# Patient Record
Sex: Female | Born: 1970
Health system: Southern US, Community
[De-identification: ages and names within clinical notes are randomized; demographics above are authoritative.]

## PROBLEM LIST (undated history)

## (undated) DIAGNOSIS — F32A Depression, unspecified: Secondary | ICD-10-CM

## (undated) DIAGNOSIS — I10 Essential (primary) hypertension: Secondary | ICD-10-CM

## (undated) DIAGNOSIS — F319 Bipolar disorder, unspecified: Secondary | ICD-10-CM

## (undated) DIAGNOSIS — J449 Chronic obstructive pulmonary disease, unspecified: Secondary | ICD-10-CM

## (undated) DIAGNOSIS — F259 Schizoaffective disorder, unspecified: Secondary | ICD-10-CM

## (undated) HISTORY — PX: CHOLECYSTECTOMY: SHX55

---
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
## 2016-01-20 NOTE — Congregational Nurse Program (Signed)
Congregational Nurse Program Note  Date of Encounter: 01/16/2016  Past Medical History: No past medical history on file.  Encounter Details:     CNP Questionnaire - 01/16/16 1146      Patient Demographics   Is this a new or existing patient? New   Patient is considered a/an Not Applicable   Race Caucasian/White     Patient Assistance   Location of Patient Assistance Not Applicable   Patient's financial/insurance status Low Income;Medicaid   Uninsured Patient ([employer] officer, trade unionCard/Care Connects) No   Patient referred to apply for the following financial assistance Not Applicable   Food insecurities addressed Not Applicable   Transportation assistance No   Assistance securing medications No   Educational health offerings Behavioral health;Navigating the healthcare system;Safety     Encounter Details   Primary purpose of visit Chronic Illness/Condition Visit;Navigating the Healthcare System;Safety   Was an Emergency Department visit averted? Not Applicable   Does patient have a medical provider? No   Patient referred to Area Agency   Was a mental health screening completed? (GAINS tool) No   Does patient have dental issues? No   Does patient have vision issues? No   Does your patient have an abnormal blood pressure today? No   Since previous encounter, have you referred patient for abnormal blood pressure that resulted in a new diagnosis or medication change? No   Does your patient have an abnormal blood glucose today? No   Since previous encounter, have you referred patient for abnormal blood glucose that resulted in a new diagnosis or medication change? No   Was there a life-saving intervention made? No      Discussed with client the need to establish with a mental health care provider before her medications run out.  Referred to [facility] for continued care

## 2016-02-13 ENCOUNTER — Encounter: Payer: Self-pay | Admitting: Pediatric Intensive Care

## 2016-02-14 ENCOUNTER — Encounter: Payer: Self-pay | Admitting: Pediatric Intensive Care

## 2016-02-17 ENCOUNTER — Encounter: Payer: Self-pay | Admitting: Pediatric Intensive Care

## 2016-02-28 NOTE — Congregational Nurse Program (Signed)
Congregational Nurse Program Note  Date of Encounter: 02/22/2016  Past Medical History: No past medical history on file.  Encounter Details:     CNP Questionnaire - 02/22/16 1258      Patient Demographics   Is this a new or existing patient? Existing   Patient is considered a/an Not Applicable   Race Caucasian/White     Patient Assistance   Location of Patient Assistance Not Applicable   Patient's financial/insurance status Low Income;Medicaid   Uninsured Patient ([employer] officer, trade unionCard/Care Connects) No   Patient referred to apply for the following financial assistance Not Applicable   Food insecurities addressed Not Applicable   Transportation assistance Yes   Type of Assistance Bus Pass Given   Assistance securing medications No   Educational health offerings Behavioral health;Navigating the healthcare system;Safety     Encounter Details   Primary purpose of visit Chronic Illness/Condition Visit;Navigating the Healthcare System;Safety   Was an Emergency Department visit averted? Not Applicable   Does patient have a medical provider? No   Patient referred to Area Agency   Was a mental health screening completed? (GAINS tool) No   Does patient have dental issues? No   Does patient have vision issues? No   Does your patient have an abnormal blood pressure today? No   Since previous encounter, have you referred patient for abnormal blood pressure that resulted in a new diagnosis or medication change? No   Does your patient have an abnormal blood glucose today? No   Since previous encounter, have you referred patient for abnormal blood glucose that resulted in a new diagnosis or medication change? No   Was there a life-saving intervention made? No     States her hands are shaking.  Tremors noted both hands.  She states she thinks it is a result of her haldol.  Instructed to see provider mary Jacqulyn BathAnn Placey [facility] at [facility] [place]  Bus passes provided

## 2016-03-03 NOTE — Congregational Nurse Program (Signed)
Congregational Nurse Program Note  Date of Encounter: 02/14/2016  Past Medical History: No past medical history on file.  Encounter Details:     CNP Questionnaire - 02/22/16 1258      Patient Demographics   Is this a new or existing patient? Existing   Patient is considered a/an Not Applicable   Race Caucasian/White     Patient Assistance   Location of Patient Assistance Not Applicable   Patient's financial/insurance status Low Income;Medicaid   Uninsured Patient ([employer] officer, trade unionCard/Care Connects) No   Patient referred to apply for the following financial assistance Not Applicable   Food insecurities addressed Not Applicable   Transportation assistance Yes   Type of Assistance Bus Pass Given   Assistance securing medications No   Educational health offerings Behavioral health;Navigating the healthcare system;Safety     Encounter Details   Primary purpose of visit Chronic Illness/Condition Visit;Navigating the Healthcare System;Safety   Was an Emergency Department visit averted? Not Applicable   Does patient have a medical provider? No   Patient referred to Area Agency   Was a mental health screening completed? (GAINS tool) No   Does patient have dental issues? No   Does patient have vision issues? No   Does your patient have an abnormal blood pressure today? No   Since previous encounter, have you referred patient for abnormal blood pressure that resulted in a new diagnosis or medication change? No   Does your patient have an abnormal blood glucose today? No   Since previous encounter, have you referred patient for abnormal blood glucose that resulted in a new diagnosis or medication change? No   Was there a life-saving intervention made? No     Client is out of medication. She requests CN contact [facility] clinic to check on refills.

## 2016-03-03 NOTE — Congregational Nurse Program (Signed)
Congregational Nurse Program Note  Date of Encounter: 02/17/2016  Past Medical History: No past medical history on file.  Encounter Details:     CNP Questionnaire - 02/22/16 1258      Patient Demographics   Is this a new or existing patient? Existing   Patient is considered a/an Not Applicable   Race Caucasian/White     Patient Assistance   Location of Patient Assistance Not Applicable   Patient's financial/insurance status Low Income;Medicaid   Uninsured Patient ([employer] officer, trade unionCard/Care Connects) No   Patient referred to apply for the following financial assistance Not Applicable   Food insecurities addressed Not Applicable   Transportation assistance Yes   Type of Assistance Bus Pass Given   Assistance securing medications No   Educational health offerings Behavioral health;Navigating the healthcare system;Safety     Encounter Details   Primary purpose of visit Chronic Illness/Condition Visit;Navigating the Healthcare System;Safety   Was an Emergency Department visit averted? Not Applicable   Does patient have a medical provider? No   Patient referred to Area Agency   Was a mental health screening completed? (GAINS tool) No   Does patient have dental issues? No   Does patient have vision issues? No   Does your patient have an abnormal blood pressure today? No   Since previous encounter, have you referred patient for abnormal blood pressure that resulted in a new diagnosis or medication change? No   Does your patient have an abnormal blood glucose today? No   Since previous encounter, have you referred patient for abnormal blood glucose that resulted in a new diagnosis or medication change? No   Was there a life-saving intervention made? No      Client in for BP check. States she is feeling "spacy" and is concerned her medication is causing this. Client will follow up with behavioral health CN next week.

## 2016-03-04 NOTE — Congregational Nurse Program (Signed)
Congregational Nurse Program Note  Date of Encounter: 02/13/2016  Past Medical History: No past medical history on file.  Encounter Details:     CNP Questionnaire - 02/22/16 1258      Patient Demographics   Is this a new or existing patient? Existing   Patient is considered a/an Not Applicable   Race Caucasian/White     Patient Assistance   Location of Patient Assistance Not Applicable   Patient's financial/insurance status Low Income;Medicaid   Uninsured Patient ([employer] Research officer, trade unionCard/Care Connects) No   Patient referred to apply for the following financial assistance Not Applicable   Food insecurities addressed Not Applicable   Transportation assistance Yes   Type of Assistance Bus Pass Given   Assistance securing medications No   Educational health offerings Behavioral health;Navigating the healthcare system;Safety     Encounter Details   Primary purpose of visit Chronic Illness/Condition Visit;Navigating the Healthcare System;Safety   Was an Emergency Department visit averted? Not Applicable   Does patient have a medical provider? No   Patient referred to Area Agency   Was a mental health screening completed? (GAINS tool) No   Does patient have dental issues? No   Does patient have vision issues? No   Does your patient have an abnormal blood pressure today? No   Since previous encounter, have you referred patient for abnormal blood pressure that resulted in a new diagnosis or medication change? No   Does your patient have an abnormal blood glucose today? No   Since previous encounter, have you referred patient for abnormal blood glucose that resulted in a new diagnosis or medication change? No   Was there a life-saving intervention made? No     Client in for BP check. States she doesn't feel well and is out of medications. States [facility]. CN will notify client provider regarding medications and BP check. Bus passes given.

## 2016-03-11 ENCOUNTER — Encounter (HOSPITAL_COMMUNITY): Payer: Self-pay | Admitting: *Deleted

## 2016-03-11 ENCOUNTER — Emergency Department (HOSPITAL_COMMUNITY)
Admission: EM | Admit: 2016-03-11 | Discharge: 2016-03-12 | Disposition: A | Discharge status: Home / Self Care | Payer: No Typology Code available for payment source | Attending: Emergency Medicine | Admitting: Emergency Medicine

## 2016-03-11 DIAGNOSIS — F251 Schizoaffective disorder, depressive type: Secondary | ICD-10-CM | POA: Diagnosis present

## 2016-03-11 DIAGNOSIS — J449 Chronic obstructive pulmonary disease, unspecified: Secondary | ICD-10-CM | POA: Diagnosis not present

## 2016-03-11 DIAGNOSIS — F17200 Nicotine dependence, unspecified, uncomplicated: Secondary | ICD-10-CM | POA: Diagnosis not present

## 2016-03-11 DIAGNOSIS — R440 Auditory hallucinations: Secondary | ICD-10-CM | POA: Diagnosis present

## 2016-03-11 DIAGNOSIS — F17210 Nicotine dependence, cigarettes, uncomplicated: Secondary | ICD-10-CM | POA: Diagnosis not present

## 2016-03-11 DIAGNOSIS — Z7689 Persons encountering health services in other specified circumstances: Secondary | ICD-10-CM

## 2016-03-11 DIAGNOSIS — Z79899 Other long term (current) drug therapy: Secondary | ICD-10-CM | POA: Diagnosis not present

## 2016-03-11 HISTORY — DX: Schizoaffective disorder, unspecified: F25.9

## 2016-03-11 HISTORY — DX: Chronic obstructive pulmonary disease, unspecified: J44.9

## 2016-03-11 LAB — CBC WITH DIFFERENTIAL/PLATELET
BASOS ABS: 0 10*3/uL (ref 0.0–0.1)
Basophils Relative: 0 %
EOS ABS: 0.1 10*3/uL (ref 0.0–0.7)
EOS PCT: 1 %
HCT: 42.6 % (ref 36.0–46.0)
Hemoglobin: 13.8 g/dL (ref 12.0–15.0)
LYMPHS PCT: 29 %
Lymphs Abs: 2.8 10*3/uL (ref 0.7–4.0)
MCH: 28.7 pg (ref 26.0–34.0)
MCHC: 32.4 g/dL (ref 30.0–36.0)
MCV: 88.6 fL (ref 78.0–100.0)
Monocytes Absolute: 0.6 10*3/uL (ref 0.1–1.0)
Monocytes Relative: 6 %
NEUTROS PCT: 64 %
Neutro Abs: 6.2 10*3/uL (ref 1.7–7.7)
PLATELETS: 213 10*3/uL (ref 150–400)
RBC: 4.81 MIL/uL (ref 3.87–5.11)
RDW: 13.8 % (ref 11.5–15.5)
WBC: 9.8 10*3/uL (ref 4.0–10.5)

## 2016-03-11 LAB — COMPREHENSIVE METABOLIC PANEL
ALT: 14 U/L (ref 14–54)
ANION GAP: 7 (ref 5–15)
AST: 18 U/L (ref 15–41)
Albumin: 4.1 g/dL (ref 3.5–5.0)
Alkaline Phosphatase: 59 U/L (ref 38–126)
BUN: 23 mg/dL — ABNORMAL HIGH (ref 6–20)
CHLORIDE: 99 mmol/L — AB (ref 101–111)
CO2: 32 mmol/L (ref 22–32)
CREATININE: 1.06 mg/dL — AB (ref 0.44–1.00)
Calcium: 9.2 mg/dL (ref 8.9–10.3)
Glucose, Bld: 105 mg/dL — ABNORMAL HIGH (ref 65–99)
Potassium: 4.1 mmol/L (ref 3.5–5.1)
Sodium: 138 mmol/L (ref 135–145)
Total Bilirubin: 0.3 mg/dL (ref 0.3–1.2)
Total Protein: 7.5 g/dL (ref 6.5–8.1)

## 2016-03-11 LAB — RAPID URINE DRUG SCREEN, HOSP PERFORMED
AMPHETAMINES: NOT DETECTED
BENZODIAZEPINES: NOT DETECTED
Barbiturates: NOT DETECTED
Cocaine: NOT DETECTED
OPIATES: NOT DETECTED
Tetrahydrocannabinol: NOT DETECTED

## 2016-03-11 LAB — ETHANOL

## 2016-03-11 MED ORDER — IPRATROPIUM-ALBUTEROL 0.5-2.5 (3) MG/3ML IN SOLN
3.0000 mL | Freq: Once | RESPIRATORY_TRACT | Status: AC
  Administered 2016-03-11: 3 mL via RESPIRATORY_TRACT
  Filled 2016-03-11: qty 3

## 2016-03-11 MED ORDER — ALBUTEROL SULFATE HFA 108 (90 BASE) MCG/ACT IN AERS: 2.0000 | INHALATION_SPRAY | Freq: Four times a day (QID) | RESPIRATORY_TRACT | Status: DC | PRN

## 2016-03-11 MED ORDER — IPRATROPIUM-ALBUTEROL 0.5-2.5 (3) MG/3ML IN SOLN: 3.0000 mL | Freq: Four times a day (QID) | RESPIRATORY_TRACT | Status: DC | PRN

## 2016-03-11 NOTE — ED Triage Notes (Signed)
Pt bib [employer] from [facility] and is stating she having a Schizophrenic episode.  Pt is having a difficult time talking and acting differently than normal.  Pt denies SI.
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
## 2016-03-11 NOTE — ED Provider Notes (Signed)
[facility]-EMERGENCY DEPT Provider Note   CSN: [id number] Arrival date & time: 03/11/16  1937 By signing my name below, I, Bridgette Habermann, attest that this documentation has been prepared under the direction and in the presence of [facility], PA-C. Electronically Signed: Bridgette Habermann, ED Scribe. 03/11/16. 8:46 PM.  History   Chief Complaint Chief Complaint  Patient presents with  . Medical Clearance   The history is provided by the patient. No language interpreter was used.   HPI Comments: Melissa Dickerson is a 46 y.o. female with h/o COPD and schizophrenia, who presents to the Emergency Department brought in by [facility] from [facility] for medical clearance. Pt states she called [employer] because she claims to be having a schizophrenic episode and is not feeling well. Pt reports having auditory and visual hallucinations that are "hard to describe" and that she has been having them since 1993; however, her hallucinations at this time seem to be worse. Pt is a smoker. Pt denies fever, chills, shortness of breath, SI, HI. Patient endorses chronic cough, unchanged from her baseline.   Past Medical History:  Diagnosis Date  . COPD (chronic obstructive pulmonary disease) (HCC)   . Schizoaffective disorder (HCC)     There are no active problems to display for this patient.   Past Surgical History:  Procedure Laterality Date  . CHOLECYSTECTOMY      OB History    No data available       Home Medications    Prior to Admission medications   Not on File    Family History No family history on file.  Social History Social History  Substance Use Topics  . Smoking status: Current Every Day Smoker  . Smokeless tobacco: Never Used  . Alcohol use No     Allergies   Patient has no known allergies.   Review of Systems Review of Systems  Constitutional: Negative for chills and fever.  Respiratory: Positive for cough. Negative for shortness of breath.   Psychiatric/Behavioral: Positive for  hallucinations. Negative for suicidal ideas.  All other systems reviewed and are negative.    Physical Exam Updated Vital Signs BP 146/97   Pulse 78   Temp 98.2 F (36.8 C) (Oral)   Resp 18   Ht 5\' 5"  (1.651 m)   Wt 254 lb 6.4 oz (115.4 kg)   LMP 02/27/2016   SpO2 93%   BMI 42.33 kg/m   Physical Exam  Constitutional: She appears well-developed and well-nourished.  HENT:  Head: Normocephalic and atraumatic.  Mouth/Throat: Oropharynx is clear and moist.  Eyes: Conjunctivae are normal.  Cardiovascular: Normal rate, regular rhythm and normal heart sounds.  Exam reveals no gallop and no friction rub.   No murmur heard. Pulmonary/Chest: Effort normal. No respiratory distress. She has wheezes. She has no rales. She exhibits no tenderness.  Speaking in full sentences without difficulty. Expiratory wheezing in bilateral lung fields.  Abdominal: Soft. Bowel sounds are normal. She exhibits no distension and no mass. There is no tenderness. There is no rebound and no guarding.  Musculoskeletal: Normal range of motion.  Neurological: She is alert.  Skin: Skin is warm and dry.  Psychiatric: She has a normal mood and affect. Her behavior is normal.  Nursing note and vitals reviewed.    ED Treatments / Results  DIAGNOSTIC STUDIES: Oxygen Saturation is 93% on RA, poor by my interpretation.    COORDINATION OF CARE: 8:46 PM Discussed treatment plan with pt at bedside and pt agreed to plan.  Labs (all labs ordered are listed, but only abnormal results are displayed) Labs Reviewed  COMPREHENSIVE METABOLIC PANEL  ETHANOL  CBC WITH DIFFERENTIAL/PLATELET  RAPID URINE DRUG SCREEN, HOSP PERFORMED    EKG  EKG Interpretation None       Radiology No results found.  Procedures Procedures (including critical care time)  Medications Ordered in ED Medications - No data to display   Initial Impression / Assessment and Plan / ED Course  I have reviewed the triage vital signs and  the nursing notes.  Pertinent labs & imaging results that were available during my care of the patient were reviewed by me and considered in my medical decision making (see chart for details).  Clinical Course    [name] is a 46 y.o. female who presents to ED for Auditory and visual hallucinations. Labs reviewed and reassuring. On exam, patient does have expiratory wheezing in bilateral lung fields. She is a daily smoker with history of COPD. DuoNeb ordered and lung sounds improved. She is medically cleared with dispo per TTS. Will put in order for inhaler to be used PRN wheezing. Does not appear to be having COPD exacerbation though.    Final Clinical Impressions(s) / ED Diagnoses   Final diagnoses:  None    New Prescriptions New Prescriptions   No medications on file   I personally performed the services described in this documentation, which was scribed in my presence. The recorded information has been reviewed and is accurate.    [facility] HospitalJaime Pilcher Marieann Zipp, PA-C 03/11/16 [id number]    Mancel BaleElliott Wentz, MD 03/12/16 [phone]

## 2016-03-11 NOTE — ED Notes (Signed)
Telepysch at bedside. 

## 2016-03-12 DIAGNOSIS — F251 Schizoaffective disorder, depressive type: Secondary | ICD-10-CM

## 2016-03-12 DIAGNOSIS — Z79899 Other long term (current) drug therapy: Secondary | ICD-10-CM | POA: Diagnosis not present

## 2016-03-12 DIAGNOSIS — F17210 Nicotine dependence, cigarettes, uncomplicated: Secondary | ICD-10-CM

## 2016-03-12 NOTE — [facility] Suicide Risk Assessment (Signed)
Suicide Risk Assessment  Discharge Assessment   [facility]   Principal Problem: Schizoaffective disorder, depressive type [facility](HCC) Discharge Diagnoses:  Patient Active Problem List   Diagnosis Date Noted  . Schizoaffective disorder, depressive type (HCC) [F25.1] 03/12/2016    Priority: High    Total Time spent with patient: 45 minutes  Musculoskeletal: Strength & Muscle Tone: within normal limits Gait & Station: normal Patient leans: N/A  Psychiatric Specialty Exam: Physical Exam  Constitutional: She is oriented to person, place, and time. She appears well-developed and well-nourished.  HENT:  Head: Normocephalic.  Neck: Normal range of motion.  Respiratory: Effort normal.  Musculoskeletal: Normal range of motion.  Neurological: She is alert and oriented to person, place, and time.  Psychiatric: She has a normal mood and affect. Her speech is normal and behavior is normal. Judgment and thought content normal. Cognition and memory are normal.    Review of Systems  All other systems reviewed and are negative.   Blood pressure 130/68, pulse 77, temperature 98.4 F (36.9 C), temperature source Oral, resp. rate 18, height 5\' 5"  (1.651 m), weight 115.4 kg (254 lb 6.4 oz), last menstrual period 02/27/2016, SpO2 93 %.Body mass index is 42.33 kg/m.  General Appearance: Casual  Eye Contact:  Good  Speech:  Normal Rate  Volume:  Normal  Mood:  Euthymic  Affect:  Congruent  Thought Process:  Coherent and Descriptions of Associations: Intact  Orientation:  Full (Time, Place, and Person)  Thought Content:  WDL  Suicidal Thoughts:  No  Homicidal Thoughts:  No  Memory:  Immediate;   Good Recent;   Good Remote;   Good  Judgement:  Fair  Insight:  Fair  Psychomotor Activity:  Normal  Concentration:  Concentration: Good and Attention Span: Good  Recall:  Good  Fund of Knowledge:  Fair  Language:  Good  Akathisia:  No  Handed:  Right  AIMS (if  indicated):     Assets:  Leisure Time Physical Health Resilience  ADL's:  Intact  Cognition:  WNL  Sleep:       Mental Status Per Nursing Assessment::   On Admission:   vague hallucinations  Demographic Factors:  Caucasian  Loss Factors: NA  Historical Factors: NA  Risk Reduction Factors:   Sense of responsibility to family  Continued Clinical Symptoms:  None  Cognitive Features That Contribute To Risk:  None    Suicide Risk:  Minimal: No identifiable suicidal ideation.  Patients presenting with no risk factors but with morbid ruminations; may be classified as minimal risk based on the severity of the depressive symptoms    Plan Of Care/Follow-up recommendations:  Activity:  as tolerated Diet:  heart healthy diet  LORD, JAMISON, NP 03/12/2016, 10:43 AM

## 2016-03-12 NOTE — Discharge Instructions (Signed)
For your ongoing mental health needs, you are advised to follow up with [facility].  If you do not currently have an appointment, new and returning patients are seen at their walk-in clinic.  Walk-in hours are Monday - Friday from 8:00 am - 3:00 pm.  Walk-in patients are seen on a first come, first served basis.  Try to arrive as early as possible for he best chance of being seen the same day: ° °     [facility] °     [address] °     , [place] [address] °     [phone] °

## 2016-03-12 NOTE — BH Assessment (Signed)
[facility] Assessment Progress Note  Per Mojeed Akintayo, MD, this pt does not require psychiatric hospitalization at this time.  Pt is to be discharged from [facility] with recommendation to follow up with [facility], her current outpatient provider.  This has been included in pt's discharge instructions.  Pt's nurse has been notified.  Aden Sek, MA Triage Specialist [phone]     

## 2016-03-12 NOTE — Consult Note (Signed)
[place] Psychiatry Consult   Reason for Consult:  Vague hallucinations Referring Physician:  EDP Patient Identification: Melissa Dickerson MRN:  [id number] Principal Diagnosis: Schizoaffective disorder, depressive type [facility]) Diagnosis:   Patient Active Problem List   Diagnosis Date Noted  . Schizoaffective disorder, depressive type ([place]) [F25.1] 03/12/2016    Priority: High    Total Time spent with patient: 45 minutes  Subjective:   Melissa Dickerson is a 46 y.o. female patient states, "I feel I got more stable after sleep."  HPI:  46 yo female who presented to the ED with vague hallucinations due to poor sleep at the shelter.  Today, she reports feeling better after sleep.  Denies suicidal/homicidal ideations, hallucinations, and alcohol/drug abuse.  Reports she goes to [facility] for her care and has medications at the [facility].  Stable for discharge.  Past Psychiatric History: schizoaffective disorder  Risk to Self: Suicidal Ideation: No Suicidal Intent: No Is patient at risk for suicide?: No Suicidal Plan?: No Access to Means: No What has been your use of drugs/alcohol within the last 12 months?: Pt reports she has used alcohol and drugs in the past, denies recent use. How many times?: 0 Other Self Harm Risks: None Triggers for Past Attempts: None known Intentional Self Injurious Behavior: None Risk to Others: Homicidal Ideation: No Thoughts of Harm to Others: No Current Homicidal Intent: No Current Homicidal Plan: No Access to Homicidal Means: No Identified Victim: None History of harm to others?: No Assessment of Violence: None Noted Violent Behavior Description: Pt denies history of violence Does patient have access to weapons?: No Criminal Charges Pending?: No Does patient have a court date: No Prior Inpatient Therapy: Prior Inpatient Therapy: Yes Prior Therapy Dates: 2017 Prior Therapy Facilty/Provider([place] [facility] [place] Reason for Treatment:  Schizophrenia Prior Outpatient Therapy: Prior Outpatient Therapy: Yes Prior Therapy Dates: Currently Prior Therapy Facilty/Provider(s): [facility] Reason for Treatment: Schizophrenia Does patient have an ACCT team?: No Does patient have Intensive In-House Services?  : No Does patient have [facility] services? : Yes Does patient have P4CC services?: No  Past Medical History:  Past Medical History:  Diagnosis Date  . COPD (chronic obstructive pulmonary disease) ([place])   . Schizoaffective disorder [facility])     Past Surgical History:  Procedure Laterality Date  . CHOLECYSTECTOMY     Family History: No family history on file. Family Psychiatric  History: unknown Social History:  History  Alcohol Use No     History  Drug Use No    Social History   Social History  . Marital status: Divorced    Spouse name: N/A  . Number of children: N/A  . Years of education: N/A   Social History Main Topics  . Smoking status: Current Every Day Smoker  . Smokeless tobacco: Never Used  . Alcohol use No  . Drug use: No  . Sexual activity: Not Asked   Other Topics Concern  . None   Social History Narrative  . None   Additional Social History:    Allergies:  No Known Allergies  Labs:  Results for orders placed or performed during the hospital encounter of 03/11/16 (from the past 48 hour(s))  Comprehensive metabolic panel     Status: Abnormal   Collection Time: 03/11/16  9:07 PM  Result Value Ref Range   Sodium 138 135 - 145 mmol/L   Potassium 4.1 3.5 - 5.1 mmol/L   Chloride 99 (L) 101 - 111 mmol/L   CO2 32 22 - 32  mmol/L   Glucose, Bld 105 (H) 65 - 99 mg/dL   BUN 23 (H) 6 - 20 mg/dL   Creatinine, Ser 1.06 (H) 0.44 - 1.00 mg/dL   Calcium 9.2 8.9 - 10.3 mg/dL   Total Protein 7.5 6.5 - 8.1 g/dL   Albumin 4.1 3.5 - 5.0 g/dL   AST 18 15 - 41 U/L   ALT 14 14 - 54 U/L   Alkaline Phosphatase 59 38 - 126 U/L   Total Bilirubin 0.3 0.3 - 1.2 mg/dL   GFR calc non Af Amer >60 >60 mL/min   GFR  calc Af Amer >60 >60 mL/min    Comment: (NOTE) The eGFR has been calculated using the CKD EPI equation. This calculation has not been validated in all clinical situations. eGFR's persistently <60 mL/min signify possible Chronic Kidney Disease.    Anion gap 7 5 - 15  Ethanol     Status: None   Collection Time: 03/11/16  9:07 PM  Result Value Ref Range   Alcohol, Ethyl (B) <5 <5 mg/dL    Comment:        LOWEST DETECTABLE LIMIT FOR SERUM ALCOHOL IS 5 mg/dL FOR MEDICAL PURPOSES ONLY   CBC with Diff     Status: None   Collection Time: 03/11/16  9:07 PM  Result Value Ref Range   WBC 9.8 4.0 - 10.5 K/uL   RBC 4.81 3.87 - 5.11 MIL/uL   Hemoglobin 13.8 12.0 - 15.0 g/dL   HCT 42.6 36.0 - 46.0 %   MCV 88.6 78.0 - 100.0 fL   MCH 28.7 26.0 - 34.0 pg   MCHC 32.4 30.0 - 36.0 g/dL   RDW 13.8 11.5 - 15.5 %   Platelets 213 150 - 400 K/uL   Neutrophils Relative % 64 %   Neutro Abs 6.2 1.7 - 7.7 K/uL   Lymphocytes Relative 29 %   Lymphs Abs 2.8 0.7 - 4.0 K/uL   Monocytes Relative 6 %   Monocytes Absolute 0.6 0.1 - 1.0 K/uL   Eosinophils Relative 1 %   Eosinophils Absolute 0.1 0.0 - 0.7 K/uL   Basophils Relative 0 %   Basophils Absolute 0.0 0.0 - 0.1 K/uL  Urine rapid drug screen (hosp performed)not at [facility]     Status: None   Collection Time: 03/11/16  9:29 PM  Result Value Ref Range   Opiates NONE DETECTED NONE DETECTED   Cocaine NONE DETECTED NONE DETECTED   Benzodiazepines NONE DETECTED NONE DETECTED   Amphetamines NONE DETECTED NONE DETECTED   Tetrahydrocannabinol NONE DETECTED NONE DETECTED   Barbiturates NONE DETECTED NONE DETECTED    Comment:        DRUG SCREEN FOR MEDICAL PURPOSES ONLY.  IF CONFIRMATION IS NEEDED FOR ANY PURPOSE, NOTIFY LAB WITHIN 5 DAYS.        LOWEST DETECTABLE LIMITS FOR URINE DRUG SCREEN Drug Class       Cutoff (ng/mL) Amphetamine      1000 Barbiturate      200 Benzodiazepine   037 Tricyclics       048 Opiates          300 Cocaine           300 THC              50     Current Facility-Administered Medications  Medication Dose Route Frequency Provider Last Rate Last Dose  . albuterol (PROVENTIL HFA;VENTOLIN HFA) 108 (90 Base) MCG/ACT inhaler 2 puff  2 puff Inhalation Q6H  PRN Ozella Almond Ward, PA-C       No current outpatient prescriptions on file.    Musculoskeletal: Strength & Muscle Tone: within normal limits Gait & Station: normal Patient leans: N/A  Psychiatric Specialty Exam: Physical Exam  Constitutional: She is oriented to person, place, and time. She appears well-developed and well-nourished.  HENT:  Head: Normocephalic.  Neck: Normal range of motion.  Respiratory: Effort normal.  Musculoskeletal: Normal range of motion.  Neurological: She is alert and oriented to person, place, and time.  Psychiatric: She has a normal mood and affect. Her speech is normal and behavior is normal. Judgment and thought content normal. Cognition and memory are normal.    Review of Systems  All other systems reviewed and are negative.   Blood pressure 130/68, pulse 77, temperature 98.4 F (36.9 C), temperature source Oral, resp. rate 18, height 5' 5" (1.651 m), weight 115.4 kg (254 lb 6.4 oz), last menstrual period 02/27/2016, SpO2 93 %.Body mass index is 42.33 kg/m.  General Appearance: Casual  Eye Contact:  Good  Speech:  Normal Rate  Volume:  Normal  Mood:  Euthymic  Affect:  Congruent  Thought Process:  Coherent and Descriptions of Associations: Intact  Orientation:  Full (Time, Place, and Person)  Thought Content:  WDL  Suicidal Thoughts:  No  Homicidal Thoughts:  No  Memory:  Immediate;   Good Recent;   Good Remote;   Good  Judgement:  Fair  Insight:  Fair  Psychomotor Activity:  Normal  Concentration:  Concentration: Good and Attention Span: Good  Recall:  Good  Fund of Knowledge:  Fair  Language:  Good  Akathisia:  No  Handed:  Right  AIMS (if indicated):     Assets:  Leisure Time Physical  Health Resilience  ADL's:  Intact  Cognition:  WNL  Sleep:        Treatment Plan Summary: Daily contact with patient to assess and evaluate symptoms and progress in treatment, Medication management and Plan schizoaffective disorder, depressed type:  -Crisis stabilization -Medication management:  No medications started as she wants to take hers after discharge -Individual counseling  Disposition: No evidence of imminent risk to self or others at present.    Waylan Boga, NP 03/12/2016 10:22 AM  Patient seen face-to-face for psychiatric evaluation, chart reviewed and case discussed with the physician extender and developed treatment plan. Reviewed the information documented and agree with the treatment plan. Corena Pilgrim, MD

## 2016-03-12 NOTE — ED Notes (Signed)
Pt got up and ambulated to the restroom without assistance. No complaints at this time.

## 2016-03-16 ENCOUNTER — Encounter: Payer: Self-pay | Admitting: Pediatric Intensive Care

## 2016-03-23 ENCOUNTER — Encounter (HOSPITAL_COMMUNITY): Payer: Self-pay

## 2016-03-23 ENCOUNTER — Emergency Department (HOSPITAL_COMMUNITY)
Admission: EM | Admit: 2016-03-23 | Discharge: 2016-03-23 | Disposition: A | Discharge status: Home / Self Care | Payer: Medicaid Other | Attending: Emergency Medicine | Admitting: Emergency Medicine

## 2016-03-23 ENCOUNTER — Emergency Department (HOSPITAL_COMMUNITY): Payer: Medicaid Other

## 2016-03-23 DIAGNOSIS — J449 Chronic obstructive pulmonary disease, unspecified: Secondary | ICD-10-CM | POA: Diagnosis not present

## 2016-03-23 DIAGNOSIS — F17200 Nicotine dependence, unspecified, uncomplicated: Secondary | ICD-10-CM | POA: Insufficient documentation

## 2016-03-23 DIAGNOSIS — I10 Essential (primary) hypertension: Secondary | ICD-10-CM | POA: Insufficient documentation

## 2016-03-23 DIAGNOSIS — R0981 Nasal congestion: Secondary | ICD-10-CM | POA: Diagnosis present

## 2016-03-23 DIAGNOSIS — J40 Bronchitis, not specified as acute or chronic: Secondary | ICD-10-CM | POA: Diagnosis not present

## 2016-03-23 MED ORDER — AEROCHAMBER Z-STAT PLUS/MEDIUM MISC: 1.0000 | Freq: Once | Status: DC

## 2016-03-23 MED ORDER — LISINOPRIL-HYDROCHLOROTHIAZIDE 10-12.5 MG PO TABS: 1.0000 | ORAL_TABLET | Freq: Every day | ORAL | 0 refills | Status: DC

## 2016-03-23 MED ORDER — ALBUTEROL SULFATE HFA 108 (90 BASE) MCG/ACT IN AERS
2.0000 | INHALATION_SPRAY | RESPIRATORY_TRACT | Status: DC | PRN
  Administered 2016-03-23: 2 via RESPIRATORY_TRACT
  Filled 2016-03-23: qty 6.7

## 2016-03-23 MED ORDER — ALBUTEROL SULFATE (2.5 MG/3ML) 0.083% IN NEBU
5.0000 mg | INHALATION_SOLUTION | Freq: Once | RESPIRATORY_TRACT | Status: AC
  Administered 2016-03-23: 5 mg via RESPIRATORY_TRACT
  Filled 2016-03-23: qty 6

## 2016-03-23 NOTE — Discharge Instructions (Signed)
[facility] today or Monday 03/27/2015 to get a primary care physician. You can also call the number on these instructions. Primary care physician. Use your albuterol inhaler 2 puffs every 4 hours as needed for cough or shortness of breath. You can take Robitussin as directed for cough. Ask your new primary care physician to help you to stop smoking. Take your blood pressure medication( lisinopril-HCTZ) as directed. Your blood pressure should be rechecked in a week

## 2016-03-23 NOTE — ED Provider Notes (Signed)
[facility]-EMERGENCY DEPT Provider Note   CSN: [id number] Arrival date & time: 03/23/16  1245   By signing my name below, I, Soijett Blue, attest that this documentation has been prepared under the direction and in the presence of Doug SouSam Luree Palla, MD. Electronically Signed: Soijett Blue, ED Scribe. 03/23/16. 2:14 PM.  History   Chief Complaint Chief Complaint  Patient presents with  . Nasal Congestion  . Cough    HPI Melissa Dickerson is a 46 y.o. female with a PMHx of COPD, schizoaffective disorder, HTN, who presents to the Emergency Department complaining of gradually improving nasal congestion onset 1 month ago. She is having associated symptoms of cough and rhinorrhea. She hasn't tried any medications for the relief of her symptoms. Pt notes that she was prescribed lisinopril-HCTZ, albuterol inhaler, and [facility]iva [facility] and has been out of it x 1 month. She denies fever, chills, and any other symptoms. Pt notes that she does smoke cigarettes. Pt denies ETOH use, illegal drug use, or having a PCP at this time.     The history is provided by the patient. No language interpreter was used.    Past Medical History:  Diagnosis Date  . COPD (chronic obstructive pulmonary disease) (HCC)   . Schizoaffective disorder [facility](HCC)     Patient Active Problem List   Diagnosis Date Noted  . Schizoaffective disorder, depressive type (HCC) 03/12/2016    Past Surgical History:  Procedure Laterality Date  . CHOLECYSTECTOMY      OB History    No data available       Home Medications    Prior to Admission medications   Not on File    Family History History reviewed. No pertinent family history.  Social History Social History  Substance Use Topics  . Smoking status: Current Every Day Smoker  . Smokeless tobacco: Never Used  . Alcohol use No     Allergies   Patient has no known allergies.   Review of Systems Review of Systems  Constitutional: Negative.   HENT: Positive for  congestion and rhinorrhea.   Respiratory: Positive for cough.   Cardiovascular: Negative.   Gastrointestinal: Negative.   Musculoskeletal: Negative.   Skin: Negative.   Neurological: Negative.   Psychiatric/Behavioral: Negative.   All other systems reviewed and are negative.    Physical Exam Updated Vital Signs BP 166/96 (BP Location: Left Arm)   Pulse 87   Temp 98.3 F (36.8 C) (Oral)   Resp 20   LMP 02/27/2016   SpO2 97%   Physical Exam  Constitutional: She appears well-developed and well-nourished.  HENT:  Head: Normocephalic and atraumatic.  Right Ear: External ear normal.  Left Ear: External ear normal.  Bilateral tympanic membranes normal  Eyes: Conjunctivae are normal. Pupils are equal, round, and reactive to light.  Neck: Neck supple. No tracheal deviation present. No thyromegaly present.  Cardiovascular: Normal rate and regular rhythm.   No murmur heard. Pulmonary/Chest: Effort normal.  scant diffuse rhonchi  Abdominal: Soft. Bowel sounds are normal. She exhibits no distension. There is no tenderness.  Musculoskeletal: Normal range of motion. She exhibits no edema or tenderness.  Neurological: She is alert. Coordination normal.  Skin: Skin is warm and dry. No rash noted.  Psychiatric: She has a normal mood and affect.  Nursing note and vitals reviewed.    ED Treatments / Results  DIAGNOSTIC STUDIES: Oxygen Saturation is 97% on RA, nl by my interpretation.    COORDINATION OF CARE: 2:11 PM Discussed treatment plan  with pt at bedside which includes CXR, breathing treatment, lisinopril-HCTZ refill, referral and follow up with PCP, and pt agreed to plan.    Radiology Dg Chest 2 View  Result Date: 03/23/2016 CLINICAL DATA:  Cough. EXAM: CHEST  2 VIEW COMPARISON:  None. FINDINGS: The heart size and mediastinal contours are within normal limits. Both lungs are clear. The visualized skeletal structures are unremarkable. IMPRESSION: No active cardiopulmonary  disease. Electronically Signed   By: Signa Kell M.D.   On: 03/23/2016 13:12    Procedures Procedures (including critical care time)  Medications Ordered in ED Medications  albuterol (PROVENTIL) (2.5 MG/3ML) 0.083% nebulizer solution 5 mg (5 mg Nebulization Given 03/23/16 1330)   Chest x-ray viewed by me Results for orders placed or performed during the hospital encounter of 03/11/16  Comprehensive metabolic panel  Result Value Ref Range   Sodium 138 135 - 145 mmol/L   Potassium 4.1 3.5 - 5.1 mmol/L   Chloride 99 (L) 101 - 111 mmol/L   CO2 32 22 - 32 mmol/L   Glucose, Bld 105 (H) 65 - 99 mg/dL   BUN 23 (H) 6 - 20 mg/dL   Creatinine, Ser 9.56 (H) 0.44 - 1.00 mg/dL   Calcium 9.2 8.9 - 21.3 mg/dL   Total Protein 7.5 6.5 - 8.1 g/dL   Albumin 4.1 3.5 - 5.0 g/dL   AST 18 15 - 41 U/L   ALT 14 14 - 54 U/L   Alkaline Phosphatase 59 38 - 126 U/L   Total Bilirubin 0.3 0.3 - 1.2 mg/dL   GFR calc non Af Amer >60 >60 mL/min   GFR calc Af Amer >60 >60 mL/min   Anion gap 7 5 - 15  Ethanol  Result Value Ref Range   Alcohol, Ethyl (B) <5 <5 mg/dL  CBC with Diff  Result Value Ref Range   WBC 9.8 4.0 - 10.5 K/uL   RBC 4.81 3.87 - 5.11 MIL/uL   Hemoglobin 13.8 12.0 - 15.0 g/dL   HCT 08.6 57.8 - 46.9 %   MCV 88.6 78.0 - 100.0 fL   MCH 28.7 26.0 - 34.0 pg   MCHC 32.4 30.0 - 36.0 g/dL   RDW 62.9 52.8 - 41.3 %   Platelets 213 150 - 400 K/uL   Neutrophils Relative % 64 %   Neutro Abs 6.2 1.7 - 7.7 K/uL   Lymphocytes Relative 29 %   Lymphs Abs 2.8 0.7 - 4.0 K/uL   Monocytes Relative 6 %   Monocytes Absolute 0.6 0.1 - 1.0 K/uL   Eosinophils Relative 1 %   Eosinophils Absolute 0.1 0.0 - 0.7 K/uL   Basophils Relative 0 %   Basophils Absolute 0.0 0.0 - 0.1 K/uL  Urine rapid drug screen (hosp performed)not at [facility]  Result Value Ref Range   Opiates NONE DETECTED NONE DETECTED   Cocaine NONE DETECTED NONE DETECTED   Benzodiazepines NONE DETECTED NONE DETECTED   Amphetamines NONE DETECTED  NONE DETECTED   Tetrahydrocannabinol NONE DETECTED NONE DETECTED   Barbiturates NONE DETECTED NONE DETECTED   Dg Chest 2 View  Result Date: 03/23/2016 CLINICAL DATA:  Cough. EXAM: CHEST  2 VIEW COMPARISON:  None. FINDINGS: The heart size and mediastinal contours are within normal limits. Both lungs are clear. The visualized skeletal structures are unremarkable. IMPRESSION: No active cardiopulmonary disease. Electronically Signed   By: Signa Kell M.D.   On: 03/23/2016 13:12    Initial Impression / Assessment and Plan / ED Course  I have  reviewed the triage vital signs and the nursing notes.  Pertinent imaging results that were available during my care of the patient were reviewed by me and considered in my medical decision making (see chart for details).   chest x-ray viewed by me  I Counseled patient for 5 minutes on smoking cessation She reports she's been out of his per, albuterol and lisinopril-HCTZ for one month. Prescriptions for lisinopril HCTz. She'll be given albuterol inhaler with spacer to go. Referral [facility] Final Clinical Impressions(s) / ED Diagnoses  Diagnosis #1 bronchitis #2 tobacco abuse #3 elevated blood pressure Number for medication noncompliance Final diagnoses:  None    New Prescriptions New Prescriptions   No medications on file   I personally performed the services described in this documentation, which was scribed in my presence. The recorded information has been reviewed and considered.     Doug Sou, MD 03/23/16 1427

## 2016-03-23 NOTE — ED Triage Notes (Signed)
Pt states cough/congestion x 1 month.  Some shortness of breath with symptoms.  Pt has COPD.  Pt is a smoker.  No fever.  Pt uses inhalers but is out of meds.

## 2016-04-02 NOTE — Congregational Nurse Program (Signed)
Congregational Nurse Program Note  Date of Encounter: 03/19/2016  Past Medical History: Past Medical History:  Diagnosis Date  . COPD (chronic obstructive pulmonary disease) (HCC)   . Schizoaffective disorder [facility](HCC)     Encounter Details:     CNP Questionnaire - 03/19/16 1200      Patient Demographics   Is this a new or existing patient? Existing   Patient is considered a/an Not Applicable   Race Caucasian/White     Patient Assistance   Location of Patient Assistance Not Applicable   Patient's financial/insurance status Low Income;Medicaid   Uninsured Patient ([employer], trade [employer]Card/Care Connects) No   Patient referred to apply for the following financial assistance Not Applicable   Food insecurities addressed Not Applicable   Transportation assistance No   Assistance securing medications No   Educational health offerings Behavioral health;Navigating the healthcare system;Safety     Encounter Details   Primary purpose of visit Chronic Illness/Condition Visit;Navigating the Healthcare System;Safety   Was an Emergency Department visit averted? Not Applicable   Does patient have a medical provider? No   Patient referred to [facility]   Was a mental health screening completed? (GAINS tool) No   Does patient have dental issues? No   Does patient have vision issues? No   Does your patient have an abnormal blood pressure today? No   Since previous encounter, have you referred patient for abnormal blood pressure that resulted in a new diagnosis or medication change? No   Does your patient have an abnormal blood glucose today? No   Since previous encounter, have you referred patient for abnormal blood glucose that resulted in a new diagnosis or medication change? No   Was there a life-saving intervention made? No     Client [facility] been [facility].  Agitated, wanting her behavioral health medications now.  [facility]  Again, explained to her since she has medicaid she cannot be seen at the free clinic.  Instructed again to [facility]

## 2016-04-14 NOTE — Congregational Nurse Program (Signed)
Congregational Nurse Program Note  Date of Encounter: 03/16/2016  Past Medical History: Past Medical History:  Diagnosis Date  . COPD (chronic obstructive pulmonary disease) (HCC)   . Schizoaffective disorder [facility](HCC)     Encounter Details:     CNP Questionnaire - 03/19/16 1200      Patient Demographics   Is this a new or existing patient? Existing   Patient is considered a/an Not Applicable   Race Caucasian/White     Patient Assistance   Location of Patient Assistance Not Applicable   Patient's financial/insurance status Low Income;Medicaid   Uninsured Patient ([employer], trade unionCard/Care Connects) No   Patient referred to apply for the following financial assistance Not Applicable   Food insecurities addressed Not Applicable   Transportation assistance No   Assistance securing medications No   Educational health offerings Behavioral health;Navigating the healthcare system;Safety     Encounter Details   Primary purpose of visit Chronic Illness/Condition Visit;Navigating the Healthcare System;Safety   Was an Emergency Department visit averted? Not Applicable   Does patient have a medical provider? No   Patient referred to Area Agency   Was a mental health screening completed? (GAINS tool) No   Does patient have dental issues? No   Does patient have vision issues? No   Does your patient have an abnormal blood pressure today? No   Since previous encounter, have you referred patient for abnormal blood pressure that resulted in a new diagnosis or medication change? No   Does your patient have an abnormal blood glucose today? No   Since previous encounter, have you referred patient for abnormal blood glucose that resulted in a new diagnosis or medication change? No   Was there a life-saving intervention made? No     BP check. Needs medication refills.

## 2016-05-10 ENCOUNTER — Emergency Department (HOSPITAL_COMMUNITY)
Admission: EM | Admit: 2016-05-10 | Discharge: 2016-05-10 | Disposition: A | Discharge status: Home / Self Care | Payer: Medicaid Other | Attending: Emergency Medicine | Admitting: Emergency Medicine

## 2016-05-10 ENCOUNTER — Encounter (HOSPITAL_COMMUNITY): Payer: Self-pay

## 2016-05-10 DIAGNOSIS — Z79899 Other long term (current) drug therapy: Secondary | ICD-10-CM | POA: Insufficient documentation

## 2016-05-10 DIAGNOSIS — J449 Chronic obstructive pulmonary disease, unspecified: Secondary | ICD-10-CM | POA: Diagnosis not present

## 2016-05-10 DIAGNOSIS — F17200 Nicotine dependence, unspecified, uncomplicated: Secondary | ICD-10-CM | POA: Insufficient documentation

## 2016-05-10 DIAGNOSIS — Z711 Person with feared health complaint in whom no diagnosis is made: Secondary | ICD-10-CM

## 2016-05-10 DIAGNOSIS — I10 Essential (primary) hypertension: Secondary | ICD-10-CM | POA: Insufficient documentation

## 2016-05-10 LAB — I-STAT CHEM 8, ED
BUN: 15 mg/dL (ref 6–20)
CREATININE: 1 mg/dL (ref 0.44–1.00)
Calcium, Ion: 1.14 mmol/L — ABNORMAL LOW (ref 1.15–1.40)
Chloride: 99 mmol/L — ABNORMAL LOW (ref 101–111)
Glucose, Bld: 89 mg/dL (ref 65–99)
HEMATOCRIT: 45 % (ref 36.0–46.0)
Hemoglobin: 15.3 g/dL — ABNORMAL HIGH (ref 12.0–15.0)
Potassium: 4.1 mmol/L (ref 3.5–5.1)
Sodium: 138 mmol/L (ref 135–145)
TCO2: 31 mmol/L (ref 0–100)

## 2016-05-10 LAB — RAPID URINE DRUG SCREEN, HOSP PERFORMED
Amphetamines: NOT DETECTED
BARBITURATES: NOT DETECTED
Benzodiazepines: NOT DETECTED
Cocaine: NOT DETECTED
Opiates: NOT DETECTED
TETRAHYDROCANNABINOL: NOT DETECTED

## 2016-05-10 LAB — URINALYSIS, ROUTINE W REFLEX MICROSCOPIC
BILIRUBIN URINE: NEGATIVE
GLUCOSE, UA: NEGATIVE mg/dL
Hgb urine dipstick: NEGATIVE
Ketones, ur: NEGATIVE mg/dL
Nitrite: NEGATIVE
PH: 6 (ref 5.0–8.0)
Protein, ur: NEGATIVE mg/dL
Specific Gravity, Urine: 1.015 (ref 1.005–1.030)

## 2016-05-10 LAB — CBC
HEMATOCRIT: 45.2 % (ref 36.0–46.0)
HEMOGLOBIN: 14.4 g/dL (ref 12.0–15.0)
MCH: 28.3 pg (ref 26.0–34.0)
MCHC: 31.9 g/dL (ref 30.0–36.0)
MCV: 89 fL (ref 78.0–100.0)
Platelets: 234 10*3/uL (ref 150–400)
RBC: 5.08 MIL/uL (ref 3.87–5.11)
RDW: 14.6 % (ref 11.5–15.5)
WBC: 10.3 10*3/uL (ref 4.0–10.5)

## 2016-05-10 NOTE — ED Notes (Signed)
Pt refusing to have call light near her bed. States she "does not want to watch TV and doesn't need a button to call the nurse."

## 2016-05-10 NOTE — Discharge Instructions (Signed)
Your workup today was reassuring. Continue to take your daily medications. Follow-up with your doctor at your scheduled visit.

## 2016-05-10 NOTE — ED Triage Notes (Signed)
Pt reports she feels as though her BP is high because her face feels red and flushed. She states she is allergic to a lot of stuff and thinks maybe she came into contact with something she is allergic to. No oral swelling, pt speaking clear complete sentences. She also reports feeling dizzy and states she smokes too many cigarettes and drank "mad dog 20/20" today.

## 2016-05-10 NOTE — ED Notes (Signed)
Tresa EndoKelly, PA aware of pt BP.

## 2016-05-10 NOTE — ED Notes (Signed)
When attempting to review her allergies with her, pt reports she is allergic to many foods and needs to be on a vegetarian diet. When asked what foods she is allergic to, she stated pork but unable to elaborate on what other foods, she just continues to talk about having a vegetarian diet and being seen by a dietician at previous mental health hospitals. Pt has schizoaffective disorder and states she is compliant with her meds.

## 2016-05-10 NOTE — ED Notes (Signed)
When attempting to assess pt, pt becomes agitated and states "I don't see why you can't just read the note, I already told them all this." This nurse explained to pt importance of communication in healthcare and that these questions and assessments are to better improve pt care and understand pt chief complaint. Pt states "why do you think I'm lying?" Pt continues to refuse to answer this nurses questions aside from pt denying CP or SOB. Pt visibly irritated and states "if she messed up what I said that's her fault but you can just read the note, I don't feel good and I don't need to be asked these questions.

## 2016-05-10 NOTE — ED Notes (Signed)
Pt ambulatory to bathroom without difficulty, pt attempting to void for urine specimen.

## 2016-05-10 NOTE — ED Notes (Signed)
Pt BP cuff repositioned on L arm and pt laid flat.

## 2016-05-10 NOTE — ED Provider Notes (Signed)
[facility]-EMERGENCY DEPT Provider Note   CSN: [id number] Arrival date & time: 05/10/16  1720    History   Chief Complaint Chief Complaint  Patient presents with  . Hypertension    HPI Melissa Dickerson is a 46 y.o. female.  46 year old female with a history of COPD and schizoaffective disorder presents to the emergency department over concern for hypertension. Patient is on daily blood pressure medication and reports compliance with this. She states that she began to feel flushed in the face with some subjective dizziness prior to arrival. This has improved. Symptoms began after patient drank a pint of mad dog 20/20 and smoked "a lot of cigarettes". Patient states that she has also been eating poorly as she is homeless. She has not had any fevers, sensation of throat closing, inability to swallow, drooling, or shortness of breath. No complaints of chest pain. Patient has scheduled follow-up with her primary care doctor in the coming weeks.   The history is provided by the patient. No language interpreter was used.  Hypertension     Past Medical History:  Diagnosis Date  . COPD (chronic obstructive pulmonary disease) (HCC)   . Schizoaffective disorder [facility])     Patient Active Problem List   Diagnosis Date Noted  . Schizoaffective disorder, depressive type (HCC) 03/12/2016    Past Surgical History:  Procedure Laterality Date  . CHOLECYSTECTOMY      OB History    No data available       Home Medications    Prior to Admission medications   Medication Sig Start Date End Date Taking? Authorizing Provider  ARIPiprazole (ABILIFY) 10 MG tablet Take 10 mg by mouth daily.   Yes Historical Provider, MD  benztropine (COGENTIN) 2 MG tablet Take 2 mg by mouth daily.   Yes Historical Provider, MD  divalproex (DEPAKOTE) 500 MG DR tablet Take 500 mg by mouth at bedtime.   Yes Historical Provider, MD  lisinopril-hydrochlorothiazide (PRINZIDE,ZESTORETIC) 20-25 MG tablet Take 1 tablet by  mouth daily.   Yes Historical Provider, MD    Family History No family history on file.  Social History Social History  Substance Use Topics  . Smoking status: Current Every Day Smoker  . Smokeless tobacco: Never Used  . Alcohol use No     Allergies   Pork-derived products   Review of Systems Review of Systems Ten systems reviewed and are negative for acute change, except as noted in the HPI.    Physical Exam Updated Vital Signs BP (!) 105/58   Pulse 78   Temp 97.6 F (36.4 C) (Oral)   Resp 14   SpO2 97%   Physical Exam  Constitutional: She is oriented to person, place, and time. She appears well-developed and well-nourished. No distress.  Obese female, in NAD  HENT:  Head: Normocephalic and atraumatic.  Mouth/Throat: Oropharynx is clear and moist.  Uvula midline. No angioedema. Patient tolerating secretions without difficulty.  Eyes: Conjunctivae and EOM are normal. No scleral icterus.  Neck: Normal range of motion.  Cardiovascular: Normal rate, regular rhythm and intact distal pulses.   Pulmonary/Chest: Effort normal. No respiratory distress. She has no rales.  Respirations even and unlabored. No rales or rhonchi.   Musculoskeletal: Normal range of motion.  Neurological: She is alert and oriented to person, place, and time. She exhibits normal muscle tone. Coordination normal.  GCS 15. Patient moving all extremities.  Skin: Skin is warm and dry. No rash noted. She is not diaphoretic. No erythema. No pallor.  Psychiatric: Her speech is normal. Her mood appears anxious (mild). She is withdrawn.  Nursing note and vitals reviewed.    ED Treatments / Results  Labs (all labs ordered are listed, but only abnormal results are displayed) Labs Reviewed  URINALYSIS, ROUTINE W REFLEX MICROSCOPIC - Abnormal; Notable for the following:       Result Value   APPearance HAZY (*)    Leukocytes, UA TRACE (*)    Bacteria, UA RARE (*)    Squamous Epithelial / LPF 0-5 (*)      All other components within normal limits  I-STAT CHEM 8, ED - Abnormal; Notable for the following:    Chloride 99 (*)    Calcium, Ion 1.14 (*)    Hemoglobin 15.3 (*)    All other components within normal limits  CBC  RAPID URINE DRUG SCREEN, HOSP PERFORMED    EKG  EKG Interpretation None       Radiology No results found.  Procedures Procedures (including critical care time)  Medications Ordered in ED Medications - No data to display   Initial Impression / Assessment and Plan / ED Course  I have reviewed the triage vital signs and the nursing notes.  Pertinent labs & imaging results that were available during my care of the patient were reviewed by me and considered in my medical decision making (see chart for details).     46 year old female presents to the emergency department over concerns for high blood pressure. She was found to mild hypotension, though she is asymptomatic. No compensatory tachycardia. Symptom onset was after smoking "a lot of cigarettes" and drinking a pint of mad dog 20/20. Patient is in no acute distress with a reassuring exam. She has been up and ambulatory without difficulty. Her laboratory workup is reassuring.  Do not see indication for further workup at this time. The patient has been encouraged to follow up with her primary care doctor regarding her visit today. Return precautions provided at discharge. Patient discharged in stable condition with no unaddressed concerns.   Final Clinical Impressions(s) / ED Diagnoses   Final diagnoses:  Worried well    New Prescriptions Discharge Medication List as of 05/10/2016 11:26 PM       Antony MaduraKelly Anda Sobotta, PA-C 05/11/16 0030    Dione Boozeavid Glick, MD 05/12/16 [id number]

## 2016-05-15 ENCOUNTER — Encounter (HOSPITAL_COMMUNITY): Payer: Self-pay

## 2016-05-15 ENCOUNTER — Emergency Department (HOSPITAL_COMMUNITY)
Admission: EM | Admit: 2016-05-15 | Discharge: 2016-05-16 | Disposition: A | Discharge status: Home / Self Care | Payer: Medicaid Other | Attending: Emergency Medicine | Admitting: Emergency Medicine

## 2016-05-15 DIAGNOSIS — J449 Chronic obstructive pulmonary disease, unspecified: Secondary | ICD-10-CM | POA: Diagnosis not present

## 2016-05-15 DIAGNOSIS — T7840XA Allergy, unspecified, initial encounter: Secondary | ICD-10-CM

## 2016-05-15 DIAGNOSIS — T781XXA Other adverse food reactions, not elsewhere classified, initial encounter: Secondary | ICD-10-CM | POA: Diagnosis present

## 2016-05-15 DIAGNOSIS — F17200 Nicotine dependence, unspecified, uncomplicated: Secondary | ICD-10-CM | POA: Diagnosis not present

## 2016-05-15 DIAGNOSIS — Z91018 Allergy to other foods: Secondary | ICD-10-CM | POA: Diagnosis not present

## 2016-05-15 MED ORDER — METHYLPREDNISOLONE SODIUM SUCC 125 MG IJ SOLR
125.0000 mg | Freq: Once | INTRAMUSCULAR | Status: AC
  Administered 2016-05-15: 125 mg via INTRAVENOUS
  Filled 2016-05-15: qty 2

## 2016-05-15 MED ORDER — FAMOTIDINE IN NACL 20-0.9 MG/50ML-% IV SOLN
20.0000 mg | Freq: Once | INTRAVENOUS | Status: AC
  Administered 2016-05-15: 20 mg via INTRAVENOUS
  Filled 2016-05-15: qty 50

## 2016-05-15 NOTE — ED Triage Notes (Signed)
Patient coming from home by EMS after having allergie reaction to pork. Pt did not realized that her meal contain pork pieces in it.  Pt state she had itching and burning all throughout. Pt state she had no difficulties swallowing. Pt had some wheezing and chest tightness. benadryl 50 mg , 5 mg neb treatment and Epi 1 :1 0.3 mg IM. By ems. Upon pt arrival to ed pt is feeling better.

## 2016-05-15 NOTE — ED Provider Notes (Signed)
[facility] DEPT Provider Note   CSN: [id number] Arrival date & time: 05/15/16  2234     History   Chief Complaint Chief Complaint  Patient presents with  . Allergic Reaction    HPI Melissa Dickerson is a 46 y.o. female.  46 year old female presents with allergic reaction to pork. Has a history of similar symptoms in the past. Called EMS after she developed diffuse pruritus as well as wheezing and feel like her throat was closing. She was treated with Benadryl as well as subcutaneous epinephrine. Feels much better at this time. Patient is never seen an allergist before in the past. Denies any new medications being used.      Past Medical History:  Diagnosis Date  . COPD (chronic obstructive pulmonary disease) (HCC)   . Schizoaffective disorder [facility](HCC)     Patient Active Problem List   Diagnosis Date Noted  . Schizoaffective disorder, depressive type (HCC) 03/12/2016    Past Surgical History:  Procedure Laterality Date  . CHOLECYSTECTOMY      OB History    No data available       Home Medications    Prior to Admission medications   Medication Sig Start Date End Date Taking? Authorizing Provider  ARIPiprazole (ABILIFY) 10 MG tablet Take 10 mg by mouth daily.    Historical Provider, MD  benztropine (COGENTIN) 2 MG tablet Take 2 mg by mouth daily.    Historical Provider, MD  divalproex (DEPAKOTE) 500 MG DR tablet Take 500 mg by mouth at bedtime.    Historical Provider, MD  lisinopril-hydrochlorothiazide (PRINZIDE,ZESTORETIC) 20-25 MG tablet Take 1 tablet by mouth daily.    Historical Provider, MD    Family History History reviewed. No pertinent family history.  Social History Social History  Substance Use Topics  . Smoking status: Current Every Day Smoker  . Smokeless tobacco: Never Used  . Alcohol use No     Allergies   Pork-derived products   Review of Systems Review of Systems  All other systems reviewed and are negative.    Physical  Exam Updated Vital Signs BP 137/79 (BP Location: Right Arm)   Pulse 79   Temp 98.3 F (36.8 C) (Oral)   Resp 18   SpO2 95%   Physical Exam  Constitutional: She is oriented to person, place, and time. She appears well-developed and well-nourished.  Non-toxic appearance. No distress.  HENT:  Head: Normocephalic and atraumatic.  Eyes: Conjunctivae, EOM and lids are normal. Pupils are equal, round, and reactive to light.  Neck: Normal range of motion. Neck supple. No tracheal deviation present. No thyroid mass present.  Cardiovascular: Normal rate, regular rhythm and normal heart sounds.  Exam reveals no gallop.   No murmur heard. Pulmonary/Chest: Effort normal and breath sounds normal. No stridor. No respiratory distress. She has no decreased breath sounds. She has no wheezes. She has no rhonchi. She has no rales.  Abdominal: Soft. Normal appearance and bowel sounds are normal. She exhibits no distension. There is no tenderness. There is no rebound and no CVA tenderness.  Musculoskeletal: Normal range of motion. She exhibits no edema or tenderness.  Neurological: She is alert and oriented to person, place, and time. She has normal strength. No cranial nerve deficit or sensory deficit. GCS eye subscore is 4. GCS verbal subscore is 5. GCS motor subscore is 6.  Skin: Skin is warm and dry. No abrasion and no rash noted.  Psychiatric: She has a normal mood and affect. Her speech is normal and  behavior is normal.  Nursing note and vitals reviewed.    ED Treatments / Results  Labs (all labs ordered are listed, but only abnormal results are displayed) Labs Reviewed - No data to display  EKG  EKG Interpretation None       Radiology No results found.  Procedures Procedures (including critical care time)  Medications Ordered in ED Medications  famotidine (PEPCID) IVPB 20 mg premix (20 mg Intravenous New Bag/Given 05/15/16 2320)  methylPREDNISolone sodium succinate (SOLU-MEDROL) 125  mg/2 mL injection 125 mg (125 mg Intravenous Given 05/15/16 2320)     Initial Impression / Assessment and Plan / ED Course  I have reviewed the triage vital signs and the nursing notes.  Pertinent labs & imaging results that were available during my care of the patient were reviewed by me and considered in my medical decision making (see chart for details).     Patient monitored here and no evidence of allergic reaction. She has no stridor. She has no rashes. Stable for discharge  Final Clinical Impressions(s) / ED Diagnoses   Final diagnoses:  None    New Prescriptions New Prescriptions   No medications on file     Lorre Nick, MD 05/16/16 [phone]

## 2016-05-15 NOTE — ED Notes (Signed)
Bed: WA06 Expected date:  Expected time:  Means of arrival:  Comments: EMS-allergic reaction 

## 2016-05-16 MED ORDER — FAMOTIDINE 20 MG PO TABS: 20.0000 mg | ORAL_TABLET | Freq: Two times a day (BID) | ORAL | 0 refills | Status: DC

## 2016-05-16 MED ORDER — PREDNISONE 20 MG PO TABS: 40.0000 mg | ORAL_TABLET | Freq: Every day | ORAL | 0 refills | Status: DC

## 2016-05-16 MED ORDER — SODIUM CHLORIDE 0.9 % IV BOLUS (SEPSIS)
1000.0000 mL | Freq: Once | INTRAVENOUS | Status: AC
  Administered 2016-05-16: 1000 mL via INTRAVENOUS

## 2016-05-16 NOTE — ED Notes (Signed)
Fluids were given on arrival and scanned later just before d/c out

## 2016-06-02 ENCOUNTER — Encounter (HOSPITAL_COMMUNITY): Payer: Self-pay | Admitting: *Deleted

## 2016-06-02 ENCOUNTER — Emergency Department (HOSPITAL_COMMUNITY)
Admission: EM | Admit: 2016-06-02 | Discharge: 2016-06-02 | Disposition: A | Discharge status: Home / Self Care | Payer: Medicaid Other | Attending: Physician Assistant | Admitting: Physician Assistant

## 2016-06-02 DIAGNOSIS — Z79899 Other long term (current) drug therapy: Secondary | ICD-10-CM | POA: Diagnosis not present

## 2016-06-02 DIAGNOSIS — S80211A Abrasion, right knee, initial encounter: Secondary | ICD-10-CM | POA: Diagnosis not present

## 2016-06-02 DIAGNOSIS — Y999 Unspecified external cause status: Secondary | ICD-10-CM | POA: Diagnosis not present

## 2016-06-02 DIAGNOSIS — M545 Low back pain, unspecified: Secondary | ICD-10-CM

## 2016-06-02 DIAGNOSIS — J449 Chronic obstructive pulmonary disease, unspecified: Secondary | ICD-10-CM | POA: Insufficient documentation

## 2016-06-02 DIAGNOSIS — Y939 Activity, unspecified: Secondary | ICD-10-CM | POA: Diagnosis not present

## 2016-06-02 DIAGNOSIS — S6990XA Unspecified injury of unspecified wrist, hand and finger(s), initial encounter: Secondary | ICD-10-CM | POA: Diagnosis not present

## 2016-06-02 DIAGNOSIS — M25551 Pain in right hip: Secondary | ICD-10-CM | POA: Insufficient documentation

## 2016-06-02 DIAGNOSIS — S8990XA Unspecified injury of unspecified lower leg, initial encounter: Secondary | ICD-10-CM

## 2016-06-02 DIAGNOSIS — W1839XA Other fall on same level, initial encounter: Secondary | ICD-10-CM | POA: Insufficient documentation

## 2016-06-02 DIAGNOSIS — S80212A Abrasion, left knee, initial encounter: Secondary | ICD-10-CM | POA: Insufficient documentation

## 2016-06-02 DIAGNOSIS — F17200 Nicotine dependence, unspecified, uncomplicated: Secondary | ICD-10-CM | POA: Diagnosis not present

## 2016-06-02 DIAGNOSIS — Y929 Unspecified place or not applicable: Secondary | ICD-10-CM | POA: Insufficient documentation

## 2016-06-02 DIAGNOSIS — W19XXXA Unspecified fall, initial encounter: Secondary | ICD-10-CM

## 2016-06-02 NOTE — Discharge Instructions (Signed)
Please take Tylenol for any pain. Please use cold compress to the areas affected. Please follow up with her primary care provider in 1-2 weeks if symptoms continue.  Get help right away if: You develop new bowel or bladder control problems. You have unusual weakness or numbness in your arms or legs. You develop nausea or vomiting. You develop abdominal pain. You feel faint. Get help right away if: Your knee feels warm to the touch. You cannot move your knee. You have severe pain in your knee. You have chest pain. You have trouble breathing.

## 2016-06-02 NOTE — ED Triage Notes (Signed)
Pt states she was sleeping at a bus stop and fell forward onto her knees. Pt also states her feet feel frozen from the cold rain outside.

## 2016-06-02 NOTE — ED Provider Notes (Signed)
[facility] Provider Note   CSN: [id number] Arrival date & time: 06/02/16  1840  By signing my name below, I, Doreatha Martin, attest that this documentation has been prepared under the direction and in the presence of  Steve Gregg, [place]. Electronically Signed: Doreatha Martin, ED Scribe. 06/02/16. 8:31 PM.    History   Chief Complaint Chief Complaint  Patient presents with  . Knee Pain    HPI Melissa Dickerson is a 46 y.o. female with h/o homelessness who presents to the Emergency Department complaining of moderate, gradually worsening bilateral painful abrasions to the knees s/p fall that occurred 2 days ago. Pt states she was dozing off on a bench during the night, fell forward and landed on her outstretched hands and knees. She denies LOC or head injury. Pt states that her knees, right hip and right lower back are painful, but only with ambulation. Pt also states her hands feel numb and swollen since the fall, but are not painful. She has h/o sciatica, but states her current symptoms are not similar. No treatments or medications tried PTA. Pt also states she has been sleeping outside for the last week and complains that her feet feel frozen. No h/o cancer, IVDU. She denies difficulty urinating, frequency, urgency, fever, chills, bowel or bladder incontinence, additional injuries.   The history is provided by the patient. No language interpreter was used.    Past Medical History:  Diagnosis Date  . COPD (chronic obstructive pulmonary disease) (HCC)   . Schizoaffective disorder [facility])     Patient Active Problem List   Diagnosis Date Noted  . Schizoaffective disorder, depressive type (HCC) 03/12/2016    Past Surgical History:  Procedure Laterality Date  . CHOLECYSTECTOMY      OB History    No data available       Home Medications    Prior to Admission medications   Medication Sig Start Date End Date Taking? Authorizing Provider  ARIPiprazole (ABILIFY) 10 MG tablet Take  10 mg by mouth daily.    Historical Provider, MD  benztropine (COGENTIN) 2 MG tablet Take 2 mg by mouth daily.    Historical Provider, MD  divalproex (DEPAKOTE) 500 MG DR tablet Take 500 mg by mouth at bedtime.    Historical Provider, MD  famotidine (PEPCID) 20 MG tablet Take 1 tablet (20 mg total) by mouth 2 (two) times daily. 05/16/16   Lorre Nick, MD  lisinopril-hydrochlorothiazide (PRINZIDE,ZESTORETIC) 20-25 MG tablet Take 1 tablet by mouth daily.    Historical Provider, MD  predniSONE (DELTASONE) 20 MG tablet Take 2 tablets (40 mg total) by mouth daily. 05/16/16   Lorre Nick, MD    Family History No family history on file.  Social History Social History  Substance Use Topics  . Smoking status: Current Every Day Smoker  . Smokeless tobacco: Never Used  . Alcohol use No     Allergies   Pork-derived products   Review of Systems Review of Systems  Gastrointestinal:       No bower incontinence   Genitourinary: Negative for difficulty urinating, frequency and urgency.       No bladder incontinence  Musculoskeletal: Positive for arthralgias and back pain.  Skin: Positive for wound.  Neurological: Negative for syncope.     Physical Exam Updated Vital Signs BP 138/90 (BP Location: Left Arm)   Pulse 81   Temp 98.5 F (36.9 C) (Oral)   Resp 18   Wt 127.9 kg   LMP 06/02/2016   SpO2  98%   BMI 46.93 kg/m   Physical Exam  Constitutional: She is oriented to person, place, and time. She appears well-developed and well-nourished.  Well appearing  HENT:  Head: Normocephalic and atraumatic.  Nose: Nose normal.  Mouth/Throat: Oropharynx is clear and moist.  Eyes: Conjunctivae and EOM are normal. Pupils are equal, round, and reactive to light.  Neck: Normal range of motion.  Normal ROM, no neck tenderness. No nuchal rigidity  Cardiovascular: Normal rate, normal heart sounds and intact distal pulses.   Radial and popliteal pulses 2+ and equal.   Pulmonary/Chest: Effort  normal and breath sounds normal. No respiratory distress. She has no wheezes. She has no rales.  Normal work of breathing. No respiratory distress noted.   Abdominal: Soft. She exhibits no mass. There is no tenderness. There is no rebound and no guarding.  Soft and nontender. No rebound or guarding. No pulsatile mass noted.   Musculoskeletal: Normal range of motion. She exhibits tenderness. She exhibits no deformity.  There is tenderness to right lower back and over right greater trochanter. No midline cervical, thoracic, or lumbar tenderness. Good ROM of spine. No deformity. No obvious wound, redness, or swelling noted.   Full ROM on active and passive ROM bilaterally. No pain with flexion or extension on active or passive ROM bilaterally. No TTP of knees or ankles. No crepitus. Negative patellar ballottement test. No effusion noted bilaterally. Negative anterior/poster drawer bilaterally. Negative Lachman's test bilaterally. No varus or valgus laxity bilaterally.  Both hands with good range of motion of wrist and fingers. No cuts, bruises, abrasions noted. No redness, swelling noted. No tenderness at anatomic snuffbox bilaterally.  Neurological: She is alert and oriented to person, place, and time.  Motor and sensation intact to BUE and BLE. Normal gait.   Skin: Skin is warm.  Small/moderate abrasion noted over right patella. No cut or laceration noted. Small abrasion noted to left knee. Both healing well with no surrounding erythema.   Psychiatric: She has a normal mood and affect. Her behavior is normal.  Nursing note and vitals reviewed.   ED Treatments / Results   DIAGNOSTIC STUDIES: Oxygen Saturation is 98% on RA, normal by my interpretation.    COORDINATION OF CARE: 8:27 PM Discussed treatment plan with pt at bedside which includes conservative home therapy and pt agreed to plan.    Labs (all labs ordered are listed, but only abnormal results are displayed) Labs Reviewed - No  data to display  EKG  EKG Interpretation None       Radiology No results found.  Procedures Procedures (including critical care time)  Medications Ordered in ED Medications - No data to display   Initial Impression / Assessment and Plan / ED Course  I have reviewed the triage vital signs and the nursing notes.  Pertinent labs & imaging results that were available during my care of the patient were reviewed by me and considered in my medical decision making (see chart for details).     Maytte Fullen presents to the ED for evaluation of bilateral knee pain, hand swelling, right hip pain, and low back pain after a fall 2 days ago. Due to history and presentation I do not feel that it is necessary to have x-rays of knees, hips, hands, low back. She appears well here. She is negative for [place] knee rule. Knees, hips, hands, low back appear well with good range of motion, good strength against resistance, and good distal pulses bilaterally.  Low suspicion  for any type of fracture or dislocation at this time. Low suspicion for any infectious processes at this time. Conservative therapies discussed and recommended, including Tylenol and ice for pain. Pt advised to f/u with PCP in 1-2 weeks with continued or worsening symptoms. Patient appears stable for discharge at this time. Return precautions discussed and outlined in discharge paperwork. Patient is agreeable to plan.     Final Clinical Impressions(s) / ED Diagnoses   Final diagnoses:  Fall, initial encounter  Acute right-sided low back pain without sciatica  Injury of hand, unspecified laterality, initial encounter  Knee injury, unspecified laterality, initial encounter    New Prescriptions New Prescriptions   No medications on file    I personally performed the services described in this documentation, which was scribed in my presence. The recorded information has been reviewed and is accurate.   [address] [place],  [place] 06/02/16 2100    Courteney Randall An, MD 06/02/16 2337

## 2016-09-28 ENCOUNTER — Encounter: Payer: Self-pay | Admitting: Pediatric Intensive Care

## 2016-10-31 NOTE — Congregational Nurse Program (Signed)
Congregational Nurse Program Note  Date of Encounter: 09/28/2016  Past Medical History: Past Medical History:  Diagnosis Date  . COPD (chronic obstructive pulmonary disease) (HCC)   . Schizoaffective disorder (HCC)     Encounter Details: Client states she lost housing and is lobby guest. Requests BP check. [facility].

## 2016-11-12 ENCOUNTER — Emergency Department (HOSPITAL_COMMUNITY): Payer: Medicaid Other

## 2016-11-12 ENCOUNTER — Emergency Department (HOSPITAL_COMMUNITY)
Admission: EM | Admit: 2016-11-12 | Discharge: 2016-11-12 | Disposition: A | Discharge status: Other Acute Inpatient Hospital | Payer: Medicaid Other | Attending: Emergency Medicine | Admitting: Emergency Medicine

## 2016-11-12 ENCOUNTER — Encounter (HOSPITAL_COMMUNITY): Payer: Self-pay

## 2016-11-12 DIAGNOSIS — R062 Wheezing: Secondary | ICD-10-CM | POA: Diagnosis not present

## 2016-11-12 DIAGNOSIS — R937 Abnormal findings on diagnostic imaging of other parts of musculoskeletal system: Secondary | ICD-10-CM | POA: Insufficient documentation

## 2016-11-12 DIAGNOSIS — F17210 Nicotine dependence, cigarettes, uncomplicated: Secondary | ICD-10-CM | POA: Diagnosis not present

## 2016-11-12 DIAGNOSIS — J449 Chronic obstructive pulmonary disease, unspecified: Secondary | ICD-10-CM | POA: Insufficient documentation

## 2016-11-12 DIAGNOSIS — N3090 Cystitis, unspecified without hematuria: Secondary | ICD-10-CM

## 2016-11-12 DIAGNOSIS — Z008 Encounter for other general examination: Secondary | ICD-10-CM

## 2016-11-12 DIAGNOSIS — F259 Schizoaffective disorder, unspecified: Secondary | ICD-10-CM | POA: Insufficient documentation

## 2016-11-12 DIAGNOSIS — Z8709 Personal history of other diseases of the respiratory system: Secondary | ICD-10-CM

## 2016-11-12 LAB — URINALYSIS, ROUTINE W REFLEX MICROSCOPIC
Bacteria, UA: NONE SEEN
Bilirubin Urine: NEGATIVE
Glucose, UA: NEGATIVE mg/dL
Ketones, ur: NEGATIVE mg/dL
Nitrite: NEGATIVE
PH: 6 (ref 5.0–8.0)
Protein, ur: NEGATIVE mg/dL
SPECIFIC GRAVITY, URINE: 1.02 (ref 1.005–1.030)

## 2016-11-12 LAB — I-STAT BETA HCG BLOOD, ED (MC, WL, AP ONLY)

## 2016-11-12 LAB — ACETAMINOPHEN LEVEL

## 2016-11-12 LAB — CBC WITH DIFFERENTIAL/PLATELET
BASOS PCT: 0 %
Basophils Absolute: 0 10*3/uL (ref 0.0–0.1)
Eosinophils Absolute: 0.2 10*3/uL (ref 0.0–0.7)
Eosinophils Relative: 2 %
HCT: 42.3 % (ref 36.0–46.0)
HEMOGLOBIN: 13.8 g/dL (ref 12.0–15.0)
Lymphocytes Relative: 39 %
Lymphs Abs: 3.2 10*3/uL (ref 0.7–4.0)
MCH: 28.9 pg (ref 26.0–34.0)
MCHC: 32.6 g/dL (ref 30.0–36.0)
MCV: 88.7 fL (ref 78.0–100.0)
MONOS PCT: 4 %
Monocytes Absolute: 0.4 10*3/uL (ref 0.1–1.0)
NEUTROS PCT: 55 %
Neutro Abs: 4.4 10*3/uL (ref 1.7–7.7)
PLATELETS: 223 10*3/uL (ref 150–400)
RBC: 4.77 MIL/uL (ref 3.87–5.11)
RDW: 13.9 % (ref 11.5–15.5)
WBC: 8.1 10*3/uL (ref 4.0–10.5)

## 2016-11-12 LAB — COMPREHENSIVE METABOLIC PANEL
ALT: 13 U/L — AB (ref 14–54)
AST: 15 U/L (ref 15–41)
Albumin: 3.9 g/dL (ref 3.5–5.0)
Alkaline Phosphatase: 79 U/L (ref 38–126)
Anion gap: 6 (ref 5–15)
BUN: 11 mg/dL (ref 6–20)
CHLORIDE: 102 mmol/L (ref 101–111)
CO2: 31 mmol/L (ref 22–32)
CREATININE: 0.78 mg/dL (ref 0.44–1.00)
Calcium: 9.3 mg/dL (ref 8.9–10.3)
GFR calc non Af Amer: >60 mL/min (ref >60)
Glucose, Bld: 99 mg/dL (ref 65–99)
Potassium: 3.8 mmol/L (ref 3.5–5.1)
SODIUM: 139 mmol/L (ref 135–145)
Total Bilirubin: 0.4 mg/dL (ref 0.3–1.2)
Total Protein: 7.3 g/dL (ref 6.5–8.1)

## 2016-11-12 LAB — RAPID URINE DRUG SCREEN, HOSP PERFORMED
Amphetamines: NOT DETECTED
BENZODIAZEPINES: NOT DETECTED
Barbiturates: NOT DETECTED
Cocaine: NOT DETECTED
OPIATES: NOT DETECTED
Tetrahydrocannabinol: NOT DETECTED

## 2016-11-12 LAB — ETHANOL: Alcohol, Ethyl (B): <5 mg/dL (ref <5)

## 2016-11-12 LAB — CBG MONITORING, ED: GLUCOSE-CAPILLARY: 81 mg/dL (ref 65–99)

## 2016-11-12 LAB — SALICYLATE LEVEL

## 2016-11-12 MED ORDER — DEXAMETHASONE 2 MG PO TABS
10.0000 mg | ORAL_TABLET | Freq: Once | ORAL | Status: AC
  Administered 2016-11-12: 19:00:00 10 mg via ORAL
  Filled 2016-11-12: qty 2

## 2016-11-12 MED ORDER — IPRATROPIUM-ALBUTEROL 0.5-2.5 (3) MG/3ML IN SOLN
3.0000 mL | Freq: Once | RESPIRATORY_TRACT | Status: AC
  Administered 2016-11-12: 3 mL via RESPIRATORY_TRACT
  Filled 2016-11-12: qty 3

## 2016-11-12 MED ORDER — ALBUTEROL SULFATE HFA 108 (90 BASE) MCG/ACT IN AERS: 1.0000 | INHALATION_SPRAY | Freq: Four times a day (QID) | RESPIRATORY_TRACT | 0 refills | Status: DC | PRN

## 2016-11-12 MED ORDER — CEPHALEXIN 500 MG PO CAPS: 500.0000 mg | ORAL_CAPSULE | Freq: Two times a day (BID) | ORAL | 0 refills | Status: DC

## 2016-11-12 NOTE — ED Provider Notes (Signed)
[facility]-EMER[facility] DEPT Provider Note   CSN: [id number] Arrival date & time: 11/12/16  1227     History   Chief Complaint Chief Complaint  Patient presents with  . Wheezing  . Medical Clearance    HPI Melissa Dickerson is a 46 y.o. female with a history of schizoaffective disorder and COPD who presents emergency department today from [facility] for wheezing. Patient states that she has been off all medication for COPD for several years. She was unaware that she was wheezing and states that she is not short of breath and has no chest pain currently. She is unsure when the wheezing started. She says that they evaluated her at [facility] and told her to come over here because she is wheezing. The patient still currently smokes, stating it is very difficult to quit however she is down to only 2-3 cigarettes per day. She denies history of intubations, or hospitalizations for COPD. No recent illness. No fever, chest pain, cough, leg swelling or pain. Patient is not on home O2.  Patient is also her for med clearance. States she is going to [place] and does not wish to disclose why. She has not other complaints and denies fever, chills, weight loss, visual changes, cough, congestion, chest pain, abdominal pain, nausea/vomiting/diarrhea, urinary frequency, dysuria or vaginal discharge. The patient is currently on her menstrual cycle. No other complaints at this time. No SI/HI.   HPI  Past Medical History:  Diagnosis Date  . COPD (chronic obstructive pulmonary disease) (HCC)   . Schizoaffective disorder [facility])     Patient Active Problem List   Diagnosis Date Noted  . Schizoaffective disorder, depressive type (HCC) 03/12/2016    Past Surgical History:  Procedure Laterality Date  . CHOLECYSTECTOMY      OB History    No data available       Home Medications    Prior to Admission medications   Medication Sig Start Date End Date Taking? Authorizing Provider  ARIPiprazole (ABILIFY) 10 MG tablet Take  10 mg by mouth daily.   Yes [provider]  benztropine (COGENTIN) 2 MG tablet Take 2 mg by mouth daily.   Yes [provider]  divalproex (DEPAKOTE) 500 MG DR tablet Take 500 mg by mouth at bedtime.   Yes [provider]  lisinopril-hydrochlorothiazide (PRINZIDE,ZESTORETIC) 20-25 MG tablet Take 1 tablet by mouth daily.   Yes [provider]  albuterol (PROVENTIL HFA;VENTOLIN HFA) 108 (90 Base) MCG/ACT inhaler Inhale 1-2 puffs into the lungs every 6 (six) hours as needed for wheezing or shortness of breath. 11/12/16   Maczis, Elmer Sow, PA-C  cephALEXin (KEFLEX) 500 MG capsule Take 1 capsule (500 mg total) by mouth 2 (two) times daily. 11/12/16   Maczis, Elmer Sow, PA-C    Family History No family history on file.  Social History Social History  Substance Use Topics  . Smoking status: Current Every Day Smoker    Packs/day: 0.15    Types: Cigarettes  . Smokeless tobacco: Never Used  . Alcohol use No     Allergies   Haldol [haloperidol]; Prozac [fluoxetine hcl]; and Pork-derived products   Review of Systems Review of Systems  All other systems reviewed and are negative.    Physical Exam Updated Vital Signs BP 136/86 (BP Location: Right Arm)   Pulse 63   Temp 98.1 F (36.7 C) (Oral)   Resp 16   Ht 5' 5.5" (1.664 m)   Wt 133.9 kg (295 lb 4 oz)   LMP 11/12/2016  SpO2 100%   BMI 48.38 kg/m   Physical Exam  Constitutional: She appears well-developed and well-nourished.  HENT:  Head: Normocephalic and atraumatic.  Right Ear: External ear normal.  Left Ear: External ear normal.  Nose: Nose normal.  Mouth/Throat: Uvula is midline, oropharynx is clear and moist and mucous membranes are normal. No tonsillar exudate.  Eyes: Pupils are equal, round, and reactive to light. Right eye exhibits no discharge. Left eye exhibits no discharge. No scleral icterus.  Neck: Trachea normal. Neck supple. No spinous process tenderness present. No neck  rigidity. Normal range of motion present.  Cardiovascular: Normal rate, regular rhythm and intact distal pulses.   No murmur heard. Pulses:      Radial pulses are 2+ on the right side, and 2+ on the left side.       Dorsalis pedis pulses are 2+ on the right side, and 2+ on the left side.       Posterior tibial pulses are 2+ on the right side, and 2+ on the left side.  No lower extremity swelling or edema. Calves symmetric in size bilaterally.  Pulmonary/Chest: Effort normal. No respiratory distress. She has wheezes. She exhibits no tenderness.  No pursed lip breathing or accessory muscle use.  Abdominal: Soft. Bowel sounds are normal. There is no tenderness. There is no rebound, no guarding and no CVA tenderness.  Musculoskeletal: She exhibits no edema.  Lymphadenopathy:    She has no cervical adenopathy.  Neurological: She is alert.  Pill rolling.  Speech clear. Follows commands. No facial droop. PERRLA. EOM grossly intact. CN III-XII grossly intact. Grossly moves all extremities 4 without ataxia. Able and appropriate strength for age to upper and lower extremities bilaterally including grip strength.   Skin: Skin is warm and dry. No rash noted. She is not diaphoretic.  Psychiatric: She has a normal mood and affect.  Nursing note and vitals reviewed.    ED Treatments / Results  Labs (all labs ordered are listed, but only abnormal results are displayed) Labs Reviewed  COMPREHENSIVE METABOLIC PANEL - Abnormal; Notable for the following:       Result Value   ALT 13 (*)    All other components within normal limits  ACETAMINOPHEN LEVEL - Abnormal; Notable for the following:    Acetaminophen (Tylenol), Serum <10 (*)    All other components within normal limits  URINALYSIS, ROUTINE W REFLEX MICROSCOPIC - Abnormal; Notable for the following:    APPearance HAZY (*)    Hgb urine dipstick LARGE (*)    Leukocytes, UA MODERATE (*)    Squamous Epithelial / LPF 6-30 (*)    All other  components within normal limits  ETHANOL  RAPID URINE DRUG SCREEN, HOSP PERFORMED  CBC WITH DIFFERENTIAL/PLATELET  SALICYLATE LEVEL  CBG MONITORING, ED  I-STAT BETA HCG BLOOD, ED ([facility], [facility], [facility] ONLY)    EKG  EKG Interpretation None       Radiology Dg Chest 2 View  Result Date: 11/12/2016 CLINICAL DATA:  Wheezing EXAM: CHEST  2 VIEW COMPARISON:  03/23/2016 FINDINGS: Normal heart size and mediastinal contours. There is no edema, consolidation, effusion, or pneumothorax. There is an 8 mm nodular density partially overlapping the anterior right third rib. Although findings such as this are often artifactual, in retrospect there may have been a nodular density in this region on prior as well. Patient is a smoker. Cholecystectomy clips. IMPRESSION: 1. 8 mm nodular density over the right mid chest. Recommend nonemergent chest CT in this  smoker, contrast not essential. 2. No evidence of active disease. Electronically Signed   By: Marnee Spring M.D.   On: 11/12/2016 14:22    Procedures Procedures (including critical care time)  Medications Ordered in ED Medications  ipratropium-albuterol (DUONEB) 0.5-2.5 (3) MG/3ML nebulizer solution 3 mL (3 mLs Nebulization Given 11/12/16 1815)  dexamethasone (DECADRON) tablet 10 mg (10 mg Oral Given 11/12/16 1852)     Initial Impression / Assessment and Plan / ED Course  I have reviewed the triage vital signs and the nursing notes.  Pertinent labs & imaging results that were available during my care of the patient were reviewed by me and considered in my medical decision making (see chart for details).     46 year old female with COPD off all medication and presents to the department today from [facility] for wheezing. Wheezing is appreciated on exam throughout. The patient is on in any respiratory distress. She does not have any history of admissions or intubations from her COPD. Patient is satting 100% on room air without tachycardia, tachypnea or fever.  Chest x-ray shows no evidence of active disease however there is a noted 8 mm nodule density over the right mid chest. Is advised for the patient get a nonemergent chest CT on an outpatient basis. We will give the patient a trial of duoneb and reassess. Patient lungs clear. CBG 81. No history of DM. Will give steroids. Will provide script for inhaler. Medical clearance labs drawn and pending.   Labs reassuring. UA with evidence of UTI. Hgb likely due to patient being on menstrual cycle. No CVA tenderness to suggest pyelonephritis. Will give keflex for treatment. Patient does have pill rolling which is likely attributed to her medications and possible tardive dyskinesia. She states this is chronic and do to past medication. Does not appear to be something acute. Patient is medically cleared.   Final Clinical Impressions(s) / ED Diagnoses   Final diagnoses:  Medical clearance for psychiatric admission  Cystitis  Wheezing  History of COPD    New Prescriptions New Prescriptions   ALBUTEROL (PROVENTIL HFA;VENTOLIN HFA) 108 (90 BASE) MCG/ACT INHALER    Inhale 1-2 puffs into the lungs every 6 (six) hours as needed for wheezing or shortness of breath.   CEPHALEXIN (KEFLEX) 500 MG CAPSULE    Take 1 capsule (500 mg total) by mouth 2 (two) times daily.     Princella Pellegrini 11/12/16 2040    Nira Conn, MD 11/13/16 Jacinta Shoe

## 2016-11-12 NOTE — ED Triage Notes (Signed)
Patient states she does not want to hurt herself or anyone else-states she contracts for safety while she is waiiting treament

## 2016-11-12 NOTE — ED Notes (Signed)
Report called to [facility] and [facility] called for transport

## 2016-11-12 NOTE — ED Notes (Signed)
[name], states she contracts for safety-needs to be treated for her wheezing and may return once treated

## 2016-11-12 NOTE — ED Notes (Signed)
Bed: [facility] Expected date:  Expected time:  Means of arrival:  Comments: 

## 2016-11-12 NOTE — ED Notes (Signed)
Pt adamant that she is not pregnant. Will not give urine sample saying it a waste of money and that she is on her menstrual cycle.

## 2016-11-12 NOTE — Discharge Instructions (Signed)
Please read and follow all provided instructions.  Your diagnoses today include:  1. Medical clearance for psychiatric admission   2. Cystitis   3. Wheezing   4. History of COPD    Tests performed today include: Vital signs. See below for your results today.  Blood work - this was reassuring  Urine sample - this showed signs of infection.  CXR - No evidence of cardiopulmonary disease today. This did show nodule that is not emergent and will need followup on an outpatient basis with CT scanning in the next 12 months.   Medications prescribed:  1) Keflex- Please take all of your antibiotics until finished!   You may develop abdominal discomfort or diarrhea from the antibiotic.  You may help offset this with probiotics which you can buy or get in yogurt. Do not eat or take the probiotics until 2 hours after your antibiotic. Do not take your medicine if develop an itchy rash, swelling in your mouth or lips, or difficulty breathing.  2) Albuterol inhaler: Take as needed for wheezing  Home care instructions are as follows:  1) Please drink plenty of water. Avoid tea and beverages with caffeine like coffee or soda 2) If you are sexually active, make sure to urinate immediately after intercourse.  Follow up:  Please follow up with your primary care physician this week or at [facility]. If you do not have one please call the [facility] and wellness Center number listed above. Please seek immediate medical care if you develop any of the following symptoms: SEEK MEDICAL CARE IF:  You have back pain.  You develop a fever.  Your symptoms do not begin to resolve within 3 days.  SEEK IMMEDIATE MEDICAL CARE IF:  You have severe back pain or lower abdominal pain.  You develop chills.  You have nausea or vomiting.  You have continued burning or discomfort with urination even after completion of antibiotic. Other reasons to return Please return to the Emergency Department if you do not get better, if you  get worse, or new symptoms OR  - Fever (temperature greater than 101.72F)  - Bleeding that does not stop with holding pressure to the area    -Severe pain (please note that you may be more sore the day after your accident)  - Chest Pain  - Difficulty breathing  - Severe nausea or vomiting  - Inability to tolerate food and liquids  - Passing out  - Skin becoming red around your wounds  - Change in mental status (confusion or lethargy)  - New numbness or weakness    Please return if you have any other emergent concerns.  Additional Information:  Your vital signs today were: BP 136/86 (BP Location: Right Arm)    Pulse 63    Temp 98.1 F (36.7 C) (Oral)    Resp 16    Ht 5' 5.5" (1.664 m)    Wt 133.9 kg (295 lb 4 oz)    LMP 11/12/2016    SpO2 100%    BMI 48.38 kg/m  If your blood pressure (BP) was elevated above 135/85 this visit, please have this repeated by your doctor within one month.

## 2016-11-12 NOTE — ED Triage Notes (Addendum)
Patient states she is here for a breathing treatment then hands papers from [facility] requesting medical clearance and orders for a CXR.Melissa Dickerson Patient refuses to answer any questions regarding mental health.   Papers from [facility] state that the patient is to return to [facility] once medically cleared.

## 2016-12-04 ENCOUNTER — Emergency Department (HOSPITAL_COMMUNITY): Payer: Medicaid Other

## 2016-12-04 ENCOUNTER — Encounter: Payer: Self-pay | Admitting: Pediatric Intensive Care

## 2016-12-04 ENCOUNTER — Inpatient Hospital Stay (HOSPITAL_COMMUNITY)
Admission: EM | Admit: 2016-12-04 | Discharge: 2016-12-07 | DRG: 190 | Disposition: A | Discharge status: Home / Self Care | Payer: Medicaid Other | Attending: Internal Medicine | Admitting: Internal Medicine

## 2016-12-04 ENCOUNTER — Encounter (HOSPITAL_COMMUNITY): Payer: Self-pay | Admitting: Emergency Medicine

## 2016-12-04 DIAGNOSIS — Z888 Allergy status to other drugs, medicaments and biological substances status: Secondary | ICD-10-CM

## 2016-12-04 DIAGNOSIS — E669 Obesity, unspecified: Secondary | ICD-10-CM | POA: Diagnosis present

## 2016-12-04 DIAGNOSIS — J4541 Moderate persistent asthma with (acute) exacerbation: Secondary | ICD-10-CM | POA: Diagnosis present

## 2016-12-04 DIAGNOSIS — J441 Chronic obstructive pulmonary disease with (acute) exacerbation: Principal | ICD-10-CM | POA: Diagnosis present

## 2016-12-04 DIAGNOSIS — Z6841 Body Mass Index (BMI) 40.0 and over, adult: Secondary | ICD-10-CM

## 2016-12-04 DIAGNOSIS — I2781 Cor pulmonale (chronic): Secondary | ICD-10-CM | POA: Diagnosis present

## 2016-12-04 DIAGNOSIS — I110 Hypertensive heart disease with heart failure: Secondary | ICD-10-CM | POA: Diagnosis present

## 2016-12-04 DIAGNOSIS — I509 Heart failure, unspecified: Secondary | ICD-10-CM | POA: Diagnosis not present

## 2016-12-04 DIAGNOSIS — R0602 Shortness of breath: Secondary | ICD-10-CM

## 2016-12-04 DIAGNOSIS — F251 Schizoaffective disorder, depressive type: Secondary | ICD-10-CM | POA: Diagnosis present

## 2016-12-04 DIAGNOSIS — F17210 Nicotine dependence, cigarettes, uncomplicated: Secondary | ICD-10-CM | POA: Diagnosis present

## 2016-12-04 DIAGNOSIS — I5033 Acute on chronic diastolic (congestive) heart failure: Secondary | ICD-10-CM | POA: Diagnosis present

## 2016-12-04 DIAGNOSIS — Z79899 Other long term (current) drug therapy: Secondary | ICD-10-CM | POA: Diagnosis not present

## 2016-12-04 DIAGNOSIS — J9601 Acute respiratory failure with hypoxia: Secondary | ICD-10-CM | POA: Diagnosis present

## 2016-12-04 DIAGNOSIS — R1310 Dysphagia, unspecified: Secondary | ICD-10-CM | POA: Diagnosis present

## 2016-12-04 DIAGNOSIS — I10 Essential (primary) hypertension: Secondary | ICD-10-CM | POA: Diagnosis not present

## 2016-12-04 DIAGNOSIS — Z91018 Allergy to other foods: Secondary | ICD-10-CM

## 2016-12-04 LAB — BASIC METABOLIC PANEL
ANION GAP: 6 (ref 5–15)
BUN: 13 mg/dL (ref 6–20)
CALCIUM: 8.9 mg/dL (ref 8.9–10.3)
CO2: 28 mmol/L (ref 22–32)
Chloride: 107 mmol/L (ref 101–111)
Creatinine, Ser: 0.77 mg/dL (ref 0.44–1.00)
GFR calc Af Amer: >60 mL/min (ref >60)
GLUCOSE: 123 mg/dL — AB (ref 65–99)
Potassium: 4.1 mmol/L (ref 3.5–5.1)
Sodium: 141 mmol/L (ref 135–145)

## 2016-12-04 LAB — CBC WITH DIFFERENTIAL/PLATELET
BASOS ABS: 0 10*3/uL (ref 0.0–0.1)
BASOS PCT: 0 %
EOS ABS: 0.2 10*3/uL (ref 0.0–0.7)
Eosinophils Relative: 2 %
HEMATOCRIT: 39.2 % (ref 36.0–46.0)
Hemoglobin: 12.5 g/dL (ref 12.0–15.0)
Lymphocytes Relative: 33 %
Lymphs Abs: 3 10*3/uL (ref 0.7–4.0)
MCH: 29 pg (ref 26.0–34.0)
MCHC: 31.9 g/dL (ref 30.0–36.0)
MCV: 91 fL (ref 78.0–100.0)
MONO ABS: 0.6 10*3/uL (ref 0.1–1.0)
Monocytes Relative: 7 %
NEUTROS ABS: 5.4 10*3/uL (ref 1.7–7.7)
Neutrophils Relative %: 58 %
PLATELETS: 186 10*3/uL (ref 150–400)
RBC: 4.31 MIL/uL (ref 3.87–5.11)
RDW: 14.8 % (ref 11.5–15.5)
WBC: 9.1 10*3/uL (ref 4.0–10.5)

## 2016-12-04 LAB — I-STAT TROPONIN, ED: TROPONIN I, POC: 0 ng/mL (ref 0.00–0.08)

## 2016-12-04 LAB — I-STAT BETA HCG BLOOD, ED (MC, WL, AP ONLY)

## 2016-12-04 LAB — BRAIN NATRIURETIC PEPTIDE: B Natriuretic Peptide: 13.6 pg/mL (ref 0.0–100.0)

## 2016-12-04 MED ORDER — LORAZEPAM 2 MG/ML IJ SOLN
0.5000 mg | Freq: Once | INTRAMUSCULAR | Status: AC
  Administered 2016-12-04: 0.5 mg via INTRAVENOUS
  Filled 2016-12-04: qty 1

## 2016-12-04 MED ORDER — LISINOPRIL 20 MG PO TABS
20.0000 mg | ORAL_TABLET | Freq: Every day | ORAL | Status: DC
  Administered 2016-12-04 – 2016-12-07 (×4): 20 mg via ORAL
  Filled 2016-12-04 (×4): qty 1

## 2016-12-04 MED ORDER — ENOXAPARIN SODIUM 40 MG/0.4ML ~~LOC~~ SOLN: 40.0000 mg | SUBCUTANEOUS | Status: DC

## 2016-12-04 MED ORDER — ESCITALOPRAM OXALATE 10 MG PO TABS
10.0000 mg | ORAL_TABLET | Freq: Every day | ORAL | Status: DC
  Administered 2016-12-04 – 2016-12-07 (×4): 10 mg via ORAL
  Filled 2016-12-04 (×4): qty 1

## 2016-12-04 MED ORDER — ARIPIPRAZOLE 10 MG PO TABS
10.0000 mg | ORAL_TABLET | Freq: Every day | ORAL | Status: DC
  Administered 2016-12-04 – 2016-12-07 (×4): 10 mg via ORAL
  Filled 2016-12-04 (×4): qty 1

## 2016-12-04 MED ORDER — AZITHROMYCIN 250 MG PO TABS
500.0000 mg | ORAL_TABLET | Freq: Every day | ORAL | Status: AC
  Administered 2016-12-04: 500 mg via ORAL
  Filled 2016-12-04: qty 2

## 2016-12-04 MED ORDER — IPRATROPIUM BROMIDE 0.02 % IN SOLN: 0.5000 mg | Freq: Four times a day (QID) | RESPIRATORY_TRACT | Status: DC

## 2016-12-04 MED ORDER — DIVALPROEX SODIUM 250 MG PO DR TAB
750.0000 mg | DELAYED_RELEASE_TABLET | Freq: Every day | ORAL | Status: DC
  Administered 2016-12-04 – 2016-12-06 (×3): 750 mg via ORAL
  Filled 2016-12-04 (×3): qty 3

## 2016-12-04 MED ORDER — HYDROCHLOROTHIAZIDE 25 MG PO TABS
25.0000 mg | ORAL_TABLET | Freq: Every day | ORAL | Status: DC
  Administered 2016-12-04 – 2016-12-07 (×4): 25 mg via ORAL
  Filled 2016-12-04 (×4): qty 1

## 2016-12-04 MED ORDER — FUROSEMIDE 10 MG/ML IJ SOLN
20.0000 mg | Freq: Once | INTRAMUSCULAR | Status: AC
  Administered 2016-12-04: 20 mg via INTRAVENOUS
  Filled 2016-12-04: qty 4

## 2016-12-04 MED ORDER — IPRATROPIUM-ALBUTEROL 0.5-2.5 (3) MG/3ML IN SOLN
3.0000 mL | Freq: Two times a day (BID) | RESPIRATORY_TRACT | Status: DC
  Administered 2016-12-04 – 2016-12-05 (×2): 3 mL via RESPIRATORY_TRACT
  Filled 2016-12-04 (×2): qty 3

## 2016-12-04 MED ORDER — ALBUTEROL SULFATE (2.5 MG/3ML) 0.083% IN NEBU: 2.5000 mg | INHALATION_SOLUTION | Freq: Four times a day (QID) | RESPIRATORY_TRACT | Status: DC

## 2016-12-04 MED ORDER — BENZTROPINE MESYLATE 0.5 MG PO TABS
2.0000 mg | ORAL_TABLET | Freq: Every day | ORAL | Status: DC
  Administered 2016-12-04 – 2016-12-07 (×4): 2 mg via ORAL
  Filled 2016-12-04 (×4): qty 4

## 2016-12-04 MED ORDER — SODIUM CHLORIDE 0.9% FLUSH
3.0000 mL | Freq: Two times a day (BID) | INTRAVENOUS | Status: DC
  Administered 2016-12-04 – 2016-12-07 (×6): 3 mL via INTRAVENOUS

## 2016-12-04 MED ORDER — IPRATROPIUM-ALBUTEROL 0.5-2.5 (3) MG/3ML IN SOLN: 3.0000 mL | Freq: Four times a day (QID) | RESPIRATORY_TRACT | Status: DC

## 2016-12-04 MED ORDER — AZITHROMYCIN 250 MG PO TABS
250.0000 mg | ORAL_TABLET | Freq: Every day | ORAL | Status: DC
  Administered 2016-12-05: 250 mg via ORAL
  Filled 2016-12-04 (×2): qty 1

## 2016-12-04 MED ORDER — SODIUM CHLORIDE 0.9 % IV SOLN: 250.0000 mL | INTRAVENOUS | Status: DC | PRN

## 2016-12-04 MED ORDER — ALBUTEROL SULFATE (2.5 MG/3ML) 0.083% IN NEBU: 2.5000 mg | INHALATION_SOLUTION | RESPIRATORY_TRACT | Status: DC | PRN

## 2016-12-04 MED ORDER — SODIUM CHLORIDE 0.9% FLUSH: 3.0000 mL | INTRAVENOUS | Status: DC | PRN

## 2016-12-04 MED ORDER — IPRATROPIUM BROMIDE 0.02 % IN SOLN
0.5000 mg | Freq: Once | RESPIRATORY_TRACT | Status: AC
  Administered 2016-12-04: 0.5 mg via RESPIRATORY_TRACT
  Filled 2016-12-04: qty 2.5

## 2016-12-04 MED ORDER — ALBUTEROL (5 MG/ML) CONTINUOUS INHALATION SOLN
10.0000 mg/h | INHALATION_SOLUTION | Freq: Once | RESPIRATORY_TRACT | Status: AC
  Administered 2016-12-04: 10 mg/h via RESPIRATORY_TRACT
  Filled 2016-12-04: qty 20

## 2016-12-04 MED ORDER — METHYLPREDNISOLONE SODIUM SUCC 125 MG IJ SOLR
60.0000 mg | Freq: Four times a day (QID) | INTRAMUSCULAR | Status: DC
  Administered 2016-12-04 – 2016-12-05 (×5): 60 mg via INTRAVENOUS
  Filled 2016-12-04 (×5): qty 2

## 2016-12-04 MED ORDER — GUAIFENESIN ER 600 MG PO TB12
600.0000 mg | ORAL_TABLET | Freq: Two times a day (BID) | ORAL | Status: DC
  Administered 2016-12-04: 600 mg via ORAL
  Filled 2016-12-04 (×2): qty 1

## 2016-12-04 NOTE — ED Provider Notes (Signed)
[facility]-EMERGENCY DEPT Provider Note   CSN: [id number] Arrival date & time: 12/04/16  4098     History   Chief Complaint Chief Complaint  Patient presents with  . Asthma    HPI Melissa Dickerson is a 46 y.o. female.  The history is provided by the patient.  Asthma  This is a new problem. The current episode started more than 2 days ago. The problem occurs constantly. The problem has been gradually worsening. Associated symptoms include shortness of breath. Pertinent negatives include no chest pain and no abdominal pain. The symptoms are aggravated by walking. Nothing relieves the symptoms. She has tried nothing for the symptoms.   46 year old female with history of COPD/asthma, chronic tobacco usage, and schizoaffective disorder who presents with shortness of breath and cough. She reports symptoms have been ongoing and progressively worsening over the past 3 days, associated with cough productive of light green sputum. Denies any known fever, chills, nausea or vomiting, abdominal pain, chest pain, known sick contacts. Reports that this feels similar to her COPD/asthma flareups in the past. Has been having bilateral pedal edema she has been on her feet more. No recent immobilization, history of PE/DVT or family history.  She called EMS this morning. On their arrival, her pulse ox 88% on room air. Received 150 mg solumedrol IV, 0.5 mg atrovent, and 10 mg albuterol prior to arrival.    Past Medical History:  Diagnosis Date  . COPD (chronic obstructive pulmonary disease) (HCC)   . Schizoaffective disorder [facility]     Patient Active Problem List   Diagnosis Date Noted  . Schizoaffective disorder, depressive type (HCC) 03/12/2016    Past Surgical History:  Procedure Laterality Date  . CHOLECYSTECTOMY      OB History    No data available       Home Medications    Prior to Admission medications   Medication Sig Start Date End Date Taking? Authorizing Provider  ARIPiprazole  (ABILIFY) 10 MG tablet Take 10 mg by mouth daily.   Yes [provider]  benztropine (COGENTIN) 2 MG tablet Take 2 mg by mouth daily.   Yes [provider]  divalproex (DEPAKOTE) 250 MG DR tablet Take 750 mg by mouth at bedtime. 11/24/16  Yes [provider]  escitalopram (LEXAPRO) 10 MG tablet Take 10 mg by mouth daily. 11/24/16  Yes [provider]  hydrochlorothiazide (HYDRODIURIL) 25 MG tablet Take 25 mg by mouth daily. 11/24/16  Yes [provider]  lisinopril (PRINIVIL,ZESTRIL) 20 MG tablet Take 20 mg by mouth daily. 11/24/16  Yes [provider]  albuterol (PROVENTIL HFA;VENTOLIN HFA) 108 (90 Base) MCG/ACT inhaler Inhale 1-2 puffs into the lungs every 6 (six) hours as needed for wheezing or shortness of breath. Patient not taking: Reported on 12/04/2016 11/12/16   Maczis, Elmer Sow, PA-C  cephALEXin (KEFLEX) 500 MG capsule Take 1 capsule (500 mg total) by mouth 2 (two) times daily. Patient not taking: Reported on 12/04/2016 11/12/16   Maczis, Elmer Sow, PA-C    Family History No family history on file.  Social History Social History  Substance Use Topics  . Smoking status: Current Every Day Smoker    Packs/day: 0.15    Types: Cigarettes  . Smokeless tobacco: Never Used  . Alcohol use No     Allergies   Haldol [haloperidol]; Pork-derived products; and Prozac [fluoxetine hcl]   Review of Systems Review of Systems  Respiratory: Positive for shortness of breath.   Cardiovascular: Negative for chest pain.  Gastrointestinal: Negative for abdominal pain.  All other systems reviewed and are negative.    Physical Exam Updated Vital Signs BP (!) 110/49   Pulse (!) 101   Temp 98.2 F (36.8 C) (Oral)   Resp 18   LMP 11/12/2016   SpO2 100%   Physical Exam Physical Exam  Nursing note and vitals reviewed. Constitutional: Well developed, well nourished, non-toxic, and in no acute distress Head: Normocephalic and atraumatic.    Mouth/Throat: Oropharynx is clear and moist.  Neck: Normal range of motion. Neck supple.  Cardiovascular: Normal rate and regular rhythm.   Pulmonary/Chest: Mild tachypnea. No conversational dyspnea. Diffuse inspiratory and expiratory wheezing. Abdominal: Soft. Obese. There is no tenderness. There is no rebound and no guarding.  Musculoskeletal: Normal range of motion. Trace bilateral pedal edema Neurological: Alert, no facial droop, fluent speech, moves all extremities symmetrically Skin: Skin is warm and dry.  Psychiatric: Cooperative   ED Treatments / Results  Labs (all labs ordered are listed, but only abnormal results are displayed) Labs Reviewed  BASIC METABOLIC PANEL - Abnormal; Notable for the following:       Result Value   Glucose, Bld 123 (*)    All other components within normal limits  CBC WITH DIFFERENTIAL/PLATELET  BRAIN NATRIURETIC PEPTIDE  I-STAT BETA HCG BLOOD, ED ([facility], [facility], [facility] ONLY)  I-STAT TROPONIN, ED    EKG  EKG Interpretation  Date/Time:  Tuesday December 04 2016 10:12:00 EDT Ventricular Rate:  90 PR Interval:    QRS Duration: 95 QT Interval:  363 QTC Calculation: 445 R Axis:   6 Text Interpretation:  Sinus rhythm Baseline wander in lead(s) II no prior EKG  Confirmed by Crista Curb [phone]) on 12/04/2016 11:10:17 AM       Radiology Dg Chest 2 View  Result Date: 12/04/2016 CLINICAL DATA:  New onset cough.  Shortness of breath . EXAM: CHEST  2 VIEW COMPARISON:  11/12/2016 FINDINGS: Cardiomegaly with mild pulmonary vascular prominence and bilateral cysts prominence suggesting mild CHF. Low lung volumes with mild basilar atelectasis . No pleural effusion or pneumothorax. IMPRESSION: Number Cardiomegaly with mild pulmonary vascular prominence and bilateral pulmonary interstitial prominence suggesting mild CHF. 2.  Low lung volumes with mild basilar atelectasis Electronically Signed   By: Maisie Fus  Register   On: 12/04/2016 10:45    Procedures Procedures  (including critical care time) CRITICAL CARE Performed by: Lavera Guise   Total critical care time: 35 minutes  Critical care time was exclusive of separately billable procedures and treating other patients.  Critical care was necessary to treat or prevent imminent or life-threatening deterioration.  Critical care was time spent personally by me on the following activities: development of treatment plan with patient and/or surrogate as well as nursing, discussions with consultants, evaluation of patient's response to treatment, examination of patient, obtaining history from patient or surrogate, ordering and performing treatments and interventions, ordering and review of laboratory studies, ordering and review of radiographic studies, pulse oximetry and re-evaluation of patient's condition.  Medications Ordered in ED Medications  albuterol (PROVENTIL,VENTOLIN) solution continuous neb (10 mg/hr Nebulization Given 12/04/16 1103)  ipratropium (ATROVENT) nebulizer solution 0.5 mg (0.5 mg Nebulization Given 12/04/16 1104)  furosemide (LASIX) injection 20 mg (20 mg Intravenous Given 12/04/16 1350)     Initial Impression / Assessment and Plan / ED Course  I have reviewed the triage vital signs and the nursing notes.  Pertinent labs & imaging results that were available during my care of the patient were reviewed  by me and considered in my medical decision making (see chart for details).     Presenting with shortness of breath and acute hypoxic respiratory failure in the setting of known COPD. Presentation, with low pulse ox on room air, tachypnea, inspiratory expiratory wheezing. Also appears to have some mild edema on exam.  She received additional 10 mg albuterol continuous with Atrovent. He received Solu-Medrol through EMS. Improved air movement after continuous albuterol, but now sounding rhonchorous. Pulse ox low 90 percentile room air, and with persistent tachypnea.  Chest x-ray shows  evidence of potential CHF. Her BNP is normal. However could be new CHF that could be causing her symptoms of shortness of breath. Given 20 mg of Lasix. Discussed with Dr. Werner Lean from hospitalist service who will admit for ongoing management.  Final Clinical Impressions(s) / ED Diagnoses   Final diagnoses:  Moderate persistent asthma with exacerbation  Acute congestive heart failure, unspecified heart failure type (HCC)  Acute respiratory failure with hypoxia [facility])    New Prescriptions New Prescriptions   No medications on file     Lavera Guise, MD 12/04/16 1415

## 2016-12-04 NOTE — H&P (Signed)
[facility]                                                                                    Melissa Dickerson Demographics  Melissa Dickerson, is a 46 y.o. female  CSN: [id number]  MRN: [id number]  DOB - [birth year]  Admit Date - 12/04/2016  Outpatient Primary MD for the Melissa Dickerson is Melissa Dickerson, No Pcp Per   With History of -  Past Medical History:  Diagnosis Date  . COPD (chronic obstructive pulmonary disease) (HCC)   . Schizoaffective disorder [facility])       Past Surgical History:  Procedure Laterality Date  . CHOLECYSTECTOMY      in for   Chief Complaint  Melissa Dickerson presents with  . Asthma     HPI  Melissa Dickerson  is a 46 y.o. female, With past medical history significant for COPD and schizoaffective disorder on treatment presenting with few days history of increasing shortness of breath cough with greenish sputum, no fever chills, no chest pains palpitations . Denies any nausea vomiting , denies abdominal pain or diarrhea. Melissa Dickerson received steroids IV no treatments in the ER and she improved slightly. To note is that when picked up by EMS her saturation was 88% on room air.    Review of Systems    In addition to the HPI above,  No Fever-chills, No Headache, No changes with Vision or hearing, No problems swallowing food or Liquids, No Chest pain, , No Abdominal pain, No Nausea or Vommitting, Bowel movements are regular, No Blood in stool or Urine, No dysuria, No new skin rashes or bruises, No new joints pains-aches,  No new weakness, tingling, numbness in any extremity, No recent weight gain or loss, No polyuria, polydypsia or polyphagia, No significant Mental Stressors.  A full 10 point Review of Systems was done, except as stated above, all other Review of Systems were negative.   Social History Social History  Substance Use Topics  . Smoking status: Current Every Day Smoker    Packs/day: 0.15    Types: Cigarettes  . Smokeless tobacco: Never Used  .  Alcohol use No     Family History No family history on file.   Prior to Admission medications   Medication Sig Start Date End Date Taking? Authorizing Provider  ARIPiprazole (ABILIFY) 10 MG tablet Take 10 mg by mouth daily.   Yes [provider]  benztropine (COGENTIN) 2 MG tablet Take 2 mg by mouth daily.   Yes [provider]  divalproex (DEPAKOTE) 250 MG DR tablet Take 750 mg by mouth at bedtime. 11/24/16  Yes [provider]  escitalopram (LEXAPRO) 10 MG tablet Take 10 mg by mouth daily. 11/24/16  Yes [provider]  hydrochlorothiazide (HYDRODIURIL) 25 MG tablet Take 25 mg by mouth daily. 11/24/16  Yes [provider]  lisinopril (PRINIVIL,ZESTRIL) 20 MG tablet Take 20 mg by mouth daily. 11/24/16  Yes [provider]  albuterol (PROVENTIL HFA;VENTOLIN HFA) 108 (90 Base) MCG/ACT inhaler Inhale 1-2 puffs into the lungs every 6 (six) hours as needed for wheezing or shortness of breath. Melissa Dickerson not taking: Reported on 12/04/2016 11/12/16   Jacinto Halim, PA-C  cephALEXin (KEFLEX) 500 MG capsule Take 1 capsule (500 mg total) by mouth 2 (two) times daily. Melissa Dickerson not taking: Reported on 12/04/2016 11/12/16   Jacinto Halim, PA-C    Allergies  Allergen Reactions  . Haldol [Haloperidol]     rash  . Pork-Derived Products Other (See Comments)  . Prozac [Fluoxetine Hcl] Rash    Physical Exam  Vitals  Blood pressure (!) 110/49, pulse (!) 101, temperature 98.2 F (36.8 C), temperature source Oral, resp. rate 18, last menstrual period 11/12/2016, SpO2 100 %.   1. General Well-nourished, well-developed female in moderate respiratory distress  2. Flat affect and insight, Not Suicidal or Homicidal, Awake Alert, Oriented X 3.  3. No F.N deficits, grossly.  4. Ears and Eyes appear Normal, Conjunctivae clear, PERRLA. Moist Oral Mucosa.  5. Supple Neck, No JVD, No cervical lymphadenopathy appriciated, No Carotid Bruits.  6.  Symmetrical Chest wall movement, scattered rhonchi and wheezing.  7. RRR, No Gallops, Rubs or Murmurs, No Parasternal Heave.  8. Positive Bowel Sounds, Abdomen Soft, Non tender, No organomegaly appriciated,No rebound -guarding or rigidity.  9.  No Cyanosis, Normal Skin Turgor, No Skin Rash or Bruise.  10. Good muscle tone,  joints appear normal , mild lower extremity edema    Data Review  CBC  Recent Labs Lab 12/04/16 0952  WBC 9.1  HGB 12.5  HCT 39.2  PLT 186  MCV 91.0  MCH 29.0  MCHC 31.9  RDW 14.8  LYMPHSABS 3.0  MONOABS 0.6  EOSABS 0.2  BASOSABS 0.0   ------------------------------------------------------------------------------------------------------------------  Chemistries   Recent Labs Lab 12/04/16 0952  NA 141  K 4.1  CL 107  CO2 28  GLUCOSE 123*  BUN 13  CREATININE 0.77  CALCIUM 8.9   ------------------------------------------------------------------------------------------------------------------ CrCl cannot be calculated (Unknown ideal weight.). ------------------------------------------------------------------------------------------------------------------ No results for input(s): TSH, T4TOTAL, T3FREE, THYROIDAB in the last 72 hours.  Invalid input(s): FREET3   Coagulation profile No results for input(s): INR, PROTIME in the last 168 hours. ------------------------------------------------------------------------------------------------------------------- No results for input(s): DDIMER in the last 72 hours. -------------------------------------------------------------------------------------------------------------------  Cardiac Enzymes No results for input(s): CKMB, TROPONINI, MYOGLOBIN in the last 168 hours.  Invalid input(s): CK ------------------------------------------------------------------------------------------------------------------ Invalid input(s):  POCBNP   ---------------------------------------------------------------------------------------------------------------  Urinalysis    Component Value Date/Time   COLORURINE YELLOW 11/12/2016 1900   APPEARANCEUR HAZY (A) 11/12/2016 1900   LABSPEC 1.020 11/12/2016 1900   PHURINE 6.0 11/12/2016 1900   GLUCOSEU NEGATIVE 11/12/2016 1900   HGBUR LARGE (A) 11/12/2016 1900   BILIRUBINUR NEGATIVE 11/12/2016 1900   KETONESUR NEGATIVE 11/12/2016 1900   PROTEINUR NEGATIVE 11/12/2016 1900   NITRITE NEGATIVE 11/12/2016 1900   LEUKOCYTESUR MODERATE (A) 11/12/2016 1900    ----------------------------------------------------------------------------------------------------------------   Imaging results:   Dg Chest 2 View  Result Date: 12/04/2016 CLINICAL DATA:  New onset cough.  Shortness of breath . EXAM: CHEST  2 VIEW COMPARISON:  11/12/2016 FINDINGS: Cardiomegaly with mild pulmonary vascular prominence and bilateral cysts prominence suggesting mild CHF. Low lung volumes with mild basilar atelectasis . No pleural effusion or pneumothorax. IMPRESSION: Number Cardiomegaly with mild pulmonary vascular prominence and bilateral pulmonary interstitial prominence suggesting mild CHF. 2.  Low lung volumes with mild basilar atelectasis Electronically Signed   By: Maisie Fus  Register   On: 12/04/2016 10:45   Dg Chest 2 View  Result Date: 11/12/2016 CLINICAL DATA:  Wheezing EXAM: CHEST  2 VIEW COMPARISON:  03/23/2016 FINDINGS: Normal heart size and mediastinal contours. There is no edema, consolidation, effusion, or pneumothorax. There is  an 8 mm nodular density partially overlapping the anterior right third rib. Although findings such as this are often artifactual, in retrospect there may have been a nodular density in this region on prior as well. Melissa Dickerson is a smoker. Cholecystectomy clips. IMPRESSION: 1. 8 mm nodular density over the right mid chest. Recommend nonemergent chest CT in this smoker, contrast not  essential. 2. No evidence of active disease. Electronically Signed   By: Marnee Spring M.D.   On: 11/12/2016 14:22      Assessment & Plan  1. COPD exacerbation/bronchitis 2. Cor pulmonale/diastolic congestive heart failure 3.Schizoaffective disorder  Plan  IV steroids IV Lasix By mouth antibiotics-Zithromax Nebulizer treatment Continue with all medications   DVT Prophylaxis Lovenox  AM Labs Ordered, also please review Full Orders  Code Status full  Disposition Plan: Home  Time spent in minutes : 34 minutes  Condition GUARDED   @

## 2016-12-04 NOTE — ED Notes (Signed)
Shann Medal is [facility] congregational clinic nurse @ [facility] [phone].  Please follow with [place] regarding patient medications and discharge

## 2016-12-04 NOTE — ED Notes (Signed)
Bed: WA14 Expected date:  Expected time:  Means of arrival:  Comments: hold 

## 2016-12-04 NOTE — ED Triage Notes (Signed)
Per pt, states she woke up this am with a productive cough and sore throat-SATS were 88% on RA when EMS arrived-10 mg of Albuterol and 0.5 mg of Atrovent, 150 mg of Solumedrol given in route-states she might be pregnant-symptoms improved-wheezing improved

## 2016-12-04 NOTE — ED Notes (Signed)
Bed: [facility] Expected date:  Expected time:  Means of arrival:  Comments: 

## 2016-12-04 NOTE — ED Notes (Signed)
Primary nurse attempted to call report.  Receiving nurse unavailable.

## 2016-12-04 NOTE — Congregational Nurse Program (Signed)
Congregational Nurse Program Note  Date of Encounter: 12/04/2016  Past Medical History: Past Medical History:  Diagnosis Date  . COPD (chronic obstructive pulmonary disease) (HCC)   . Schizoaffective disorder [facility])     Encounter Details:     CNP Questionnaire - 12/04/16 0845      Patient Demographics   Is this a new or existing patient? New   Patient is considered a/an Not Applicable   Race Caucasian/White     Patient Assistance   Location of Patient Assistance [place]   Patient's financial/insurance status Medicaid;Low Income   Uninsured Patient ([employer], trade [employer]) No   Patient referred to apply for the following financial assistance Not Applicable   Food insecurities addressed Not Applicable   Transportation assistance No   Assistance securing medications No   Educational health offerings Not Applicable     Encounter Details   Primary purpose of visit Acute Illness/Condition Visit   Was an Emergency Department visit averted? No   Does patient have a medical provider? Yes   Patient referred to Emergency Department   Was a mental health screening completed? (GAINS tool) No   Does patient have dental issues? No   Does patient have vision issues? No   Does your patient have an abnormal blood pressure today? Yes   Since previous encounter, have you referred patient for abnormal blood pressure that resulted in a new diagnosis or medication change? No   Does your patient have an abnormal blood glucose today? No   Since previous encounter, have you referred patient for abnormal blood glucose that resulted in a new diagnosis or medication change? No   Was there a life-saving intervention made? No    Client having difficulty breathing. Difficulty getting words out. States she has not had medication since beginning of the month. Increased work of breathing, audible wheezing and wet cough noted. Feet and ankles with 4+ edema. Client agrees to go to hospital via EMS. EMS  notified.

## 2016-12-04 NOTE — ED Notes (Signed)
Patient given coke and [place] sandwich-276mL

## 2016-12-05 DIAGNOSIS — F251 Schizoaffective disorder, depressive type: Secondary | ICD-10-CM

## 2016-12-05 DIAGNOSIS — J441 Chronic obstructive pulmonary disease with (acute) exacerbation: Principal | ICD-10-CM

## 2016-12-05 DIAGNOSIS — I10 Essential (primary) hypertension: Secondary | ICD-10-CM | POA: Diagnosis present

## 2016-12-05 LAB — CBC
HCT: 40.2 % (ref 36.0–46.0)
HEMOGLOBIN: 12.6 g/dL (ref 12.0–15.0)
MCH: 28.7 pg (ref 26.0–34.0)
MCHC: 31.3 g/dL (ref 30.0–36.0)
MCV: 91.6 fL (ref 78.0–100.0)
Platelets: 233 10*3/uL (ref 150–400)
RBC: 4.39 MIL/uL (ref 3.87–5.11)
RDW: 15.1 % (ref 11.5–15.5)
WBC: 19.7 10*3/uL — AB (ref 4.0–10.5)

## 2016-12-05 LAB — GLUCOSE, CAPILLARY
GLUCOSE-CAPILLARY: 137 mg/dL — AB (ref 65–99)
Glucose-Capillary: 147 mg/dL — ABNORMAL HIGH (ref 65–99)

## 2016-12-05 LAB — BASIC METABOLIC PANEL
ANION GAP: 10 (ref 5–15)
BUN: 14 mg/dL (ref 6–20)
CHLORIDE: 103 mmol/L (ref 101–111)
CO2: 25 mmol/L (ref 22–32)
Calcium: 9.7 mg/dL (ref 8.9–10.3)
Creatinine, Ser: 0.88 mg/dL (ref 0.44–1.00)
Glucose, Bld: 154 mg/dL — ABNORMAL HIGH (ref 65–99)
POTASSIUM: 4.4 mmol/L (ref 3.5–5.1)
SODIUM: 138 mmol/L (ref 135–145)

## 2016-12-05 LAB — HIV ANTIBODY (ROUTINE TESTING W REFLEX): HIV SCREEN 4TH GENERATION: NONREACTIVE

## 2016-12-05 MED ORDER — LORATADINE 10 MG PO TABS
10.0000 mg | ORAL_TABLET | Freq: Every day | ORAL | Status: DC
  Administered 2016-12-05 – 2016-12-07 (×3): 10 mg via ORAL
  Filled 2016-12-05 (×3): qty 1

## 2016-12-05 MED ORDER — PANTOPRAZOLE SODIUM 40 MG PO TBEC
40.0000 mg | DELAYED_RELEASE_TABLET | Freq: Every day | ORAL | Status: DC
  Administered 2016-12-05 – 2016-12-07 (×3): 40 mg via ORAL
  Filled 2016-12-05 (×3): qty 1

## 2016-12-05 MED ORDER — FLUTICASONE PROPIONATE 50 MCG/ACT NA SUSP
2.0000 | Freq: Every day | NASAL | Status: DC
  Administered 2016-12-05 – 2016-12-07 (×3): 2 via NASAL
  Filled 2016-12-05: qty 16

## 2016-12-05 MED ORDER — GUAIFENESIN ER 600 MG PO TB12
1200.0000 mg | ORAL_TABLET | Freq: Two times a day (BID) | ORAL | Status: DC
  Administered 2016-12-05 – 2016-12-07 (×5): 1200 mg via ORAL
  Filled 2016-12-05 (×5): qty 2

## 2016-12-05 MED ORDER — NICOTINE 14 MG/24HR TD PT24
14.0000 mg | MEDICATED_PATCH | Freq: Every day | TRANSDERMAL | Status: DC
  Administered 2016-12-05 – 2016-12-07 (×3): 14 mg via TRANSDERMAL
  Filled 2016-12-05 (×3): qty 1

## 2016-12-05 MED ORDER — METHYLPREDNISOLONE SODIUM SUCC 125 MG IJ SOLR
60.0000 mg | Freq: Two times a day (BID) | INTRAMUSCULAR | Status: DC
  Administered 2016-12-06 – 2016-12-07 (×2): 60 mg via INTRAVENOUS
  Filled 2016-12-05 (×3): qty 2

## 2016-12-05 MED ORDER — BUDESONIDE 0.5 MG/2ML IN SUSP
0.5000 mg | Freq: Two times a day (BID) | RESPIRATORY_TRACT | Status: DC
  Administered 2016-12-05 – 2016-12-07 (×5): 0.5 mg via RESPIRATORY_TRACT
  Filled 2016-12-05 (×4): qty 2

## 2016-12-05 MED ORDER — IPRATROPIUM-ALBUTEROL 0.5-2.5 (3) MG/3ML IN SOLN
3.0000 mL | Freq: Four times a day (QID) | RESPIRATORY_TRACT | Status: DC
  Administered 2016-12-05 – 2016-12-06 (×4): 3 mL via RESPIRATORY_TRACT
  Filled 2016-12-05 (×4): qty 3

## 2016-12-05 NOTE — Progress Notes (Signed)
PROGRESS NOTE    Melissa Dickerson  ZOX:[id number] DOB: 12/16/1970 DOA: 12/04/2016 PCP: Patient, No Pcp Per    Brief Narrative:  Patient is a 46 year old female history of COPD, schizoaffective disorder presenting with worsening shortness of breath, greenish sputum, wheezing noted to be in acute COPD exacerbation.   Assessment & Plan:   Principal Problem:   COPD exacerbation (HCC) Active Problems:   Schizoaffective disorder, depressive type (HCC)   HTN (hypertension)  #1 acute COPD exacerbation Patient presented with increasing shortness of breath cough of greenish sputum, noted to have some wheezing and noted to be in acute COPD exacerbation. Patient with some clinical improvement. Place on Pulmicort, Mucinex, Flonase, scheduled nebulizer treatments, Claritin, PPI. Continue IV steroid taper. Follow.  #2 schizoaffective disorder Stable. Continue home regimen of Abilify, Cogentin, Depakote, Lexapro.  #3 hypertension Continue HCTZ, lisinopril. Follow.   DVT prophylaxis: SCD Code Status: Full Family Communication: Updated patient. No family at bedside. Disposition Plan: Home when medically stable with improvement with acute exacerbation hopefully tomorrow.   Consultants:   None  Procedures:   Chest x-ray 12/04/2016    Antimicrobials:   Azithromycin 12/04/2016   Subjective: Patient sitting up in chair getting a nebulizer treatment. Patient states improvement of shortness of breath since admission. Patient denies chest pain. Patient complaining of some difficulty swallowing however at 100% of her meal.  Objective: Vitals:   12/05/16 0818 12/05/16 1344 12/05/16 1430 12/05/16 1951  BP:   132/84 134/63  Pulse:   79 83  Resp:   18 18  Temp:   98.5 F (36.9 C) 98.4 F (36.9 C)  TempSrc:   Oral Oral  SpO2: 92% 92% 96% 95%  Weight:      Height:        Intake/Output Summary (Last 24 hours) at 12/05/16 1954 Last data filed at 12/05/16 1820  Gross per 24 hour  Intake               702 ml  Output             1200 ml  Net             -498 ml   Filed Weights   12/04/16 1619  Weight: (!) 136.3 kg (300 lb 6.4 oz)    Examination:  General exam: Appears calm and comfortable.Obese Respiratory system: Minimal expiratory wheezing. No crackles. No rhonchi. On nebulizer.  Cardiovascular system: S1 & S2 heard, RRR. No JVD, murmurs, rubs, gallops or clicks. No pedal edema. Gastrointestinal system: Abdomen is nondistended, soft and nontender. No organomegaly or masses felt. Normal bowel sounds heard. Central nervous system: Alert and oriented. No focal neurological deficits. Extremities: Symmetric 5 x 5 power. Skin: No rashes, lesions or ulcers Psychiatry: Judgement and insight appear normal. Mood & affect appropriate.     Data Reviewed: I have personally reviewed following labs and imaging studies  CBC:  Recent Labs Lab 12/04/16 0952 12/05/16 0417  WBC 9.1 19.7*  NEUTROABS 5.4  --   HGB 12.5 12.6  HCT 39.2 40.2  MCV 91.0 91.6  PLT 186 233   Basic Metabolic Panel:  Recent Labs Lab 12/04/16 0952 12/05/16 0417  NA 141 138  K 4.1 4.4  CL 107 103  CO2 28 25  GLUCOSE 123* 154*  BUN 13 14  CREATININE 0.77 0.88  CALCIUM 8.9 9.7   GFR: Estimated Creatinine Clearance: 112.9 mL/min (by C-G formula based on SCr of 0.88 mg/dL). Liver Function Tests: No results for input(s): AST, ALT,  ALKPHOS, BILITOT, PROT, ALBUMIN in the last 168 hours. No results for input(s): LIPASE, AMYLASE in the last 168 hours. No results for input(s): AMMONIA in the last 168 hours. Coagulation Profile: No results for input(s): INR, PROTIME in the last 168 hours. Cardiac Enzymes: No results for input(s): CKTOTAL, CKMB, CKMBINDEX, TROPONINI in the last 168 hours. BNP (last 3 results) No results for input(s): PROBNP in the last 8760 hours. HbA1C: No results for input(s): HGBA1C in the last 72 hours. CBG:  Recent Labs Lab 12/05/16 1228 12/05/16 1701  GLUCAP 137*  147*   Lipid Profile: No results for input(s): CHOL, HDL, LDLCALC, TRIG, CHOLHDL, LDLDIRECT in the last 72 hours. Thyroid Function Tests: No results for input(s): TSH, T4TOTAL, FREET4, T3FREE, THYROIDAB in the last 72 hours. Anemia Panel: No results for input(s): VITAMINB12, FOLATE, FERRITIN, TIBC, IRON, RETICCTPCT in the last 72 hours. Sepsis Labs: No results for input(s): PROCALCITON, LATICACIDVEN in the last 168 hours.  No results found for this or any previous visit (from the past 240 hour(s)).       Radiology Studies: Dg Chest 2 View  Result Date: 12/04/2016 CLINICAL DATA:  New onset cough.  Shortness of breath . EXAM: CHEST  2 VIEW COMPARISON:  11/12/2016 FINDINGS: Cardiomegaly with mild pulmonary vascular prominence and bilateral cysts prominence suggesting mild CHF. Low lung volumes with mild basilar atelectasis . No pleural effusion or pneumothorax. IMPRESSION: Number Cardiomegaly with mild pulmonary vascular prominence and bilateral pulmonary interstitial prominence suggesting mild CHF. 2.  Low lung volumes with mild basilar atelectasis Electronically Signed   By: Maisie Fus  Register   On: 12/04/2016 10:45        Scheduled Meds: . ARIPiprazole  10 mg Oral Daily  . azithromycin  250 mg Oral QAC supper  . benztropine  2 mg Oral Daily  . budesonide (PULMICORT) nebulizer solution  0.5 mg Nebulization BID  . divalproex  750 mg Oral QHS  . escitalopram  10 mg Oral Daily  . fluticasone  2 spray Each Nare Daily  . guaiFENesin  1,200 mg Oral BID  . hydrochlorothiazide  25 mg Oral Daily  . ipratropium-albuterol  3 mL Nebulization Q6H  . lisinopril  20 mg Oral Daily  . loratadine  10 mg Oral Daily  . methylPREDNISolone (SOLU-MEDROL) injection  60 mg Intravenous Q6H  . nicotine  14 mg Transdermal Daily  . pantoprazole  40 mg Oral Q0600  . sodium chloride flush  3 mL Intravenous Q12H   Continuous Infusions: . sodium chloride       LOS: 1 day    Time spent: 35  minutes    Joyceann Kruser, MD [facility] Pager [phone]  If 7PM-7AM, please contact night-coverage [email or web address] Password [email or web address] 12/05/2016, 7:54 PM

## 2016-12-06 LAB — CBC WITH DIFFERENTIAL/PLATELET
BASOS ABS: 0 10*3/uL (ref 0.0–0.1)
BASOS PCT: 0 %
EOS ABS: 0 10*3/uL (ref 0.0–0.7)
Eosinophils Relative: 0 %
HCT: 39.5 % (ref 36.0–46.0)
HEMOGLOBIN: 12.5 g/dL (ref 12.0–15.0)
Lymphocytes Relative: 14 %
Lymphs Abs: 2.4 10*3/uL (ref 0.7–4.0)
MCH: 28.9 pg (ref 26.0–34.0)
MCHC: 31.6 g/dL (ref 30.0–36.0)
MCV: 91.4 fL (ref 78.0–100.0)
Monocytes Absolute: 0.9 10*3/uL (ref 0.1–1.0)
Monocytes Relative: 5 %
NEUTROS PCT: 81 %
Neutro Abs: 13.8 10*3/uL — ABNORMAL HIGH (ref 1.7–7.7)
PLATELETS: 221 10*3/uL (ref 150–400)
RBC: 4.32 MIL/uL (ref 3.87–5.11)
RDW: 15.2 % (ref 11.5–15.5)
WBC: 17 10*3/uL — AB (ref 4.0–10.5)

## 2016-12-06 LAB — BASIC METABOLIC PANEL
Anion gap: 8 (ref 5–15)
BUN: 20 mg/dL (ref 6–20)
CHLORIDE: 101 mmol/L (ref 101–111)
CO2: 30 mmol/L (ref 22–32)
CREATININE: 0.87 mg/dL (ref 0.44–1.00)
Calcium: 9.3 mg/dL (ref 8.9–10.3)
GFR calc Af Amer: >60 mL/min (ref >60)
GLUCOSE: 96 mg/dL (ref 65–99)
Potassium: 4.4 mmol/L (ref 3.5–5.1)
SODIUM: 139 mmol/L (ref 135–145)

## 2016-12-06 MED ORDER — IPRATROPIUM-ALBUTEROL 0.5-2.5 (3) MG/3ML IN SOLN
3.0000 mL | Freq: Two times a day (BID) | RESPIRATORY_TRACT | Status: DC
  Administered 2016-12-06 – 2016-12-07 (×2): 3 mL via RESPIRATORY_TRACT
  Filled 2016-12-06 (×2): qty 3

## 2016-12-06 NOTE — Progress Notes (Signed)
PROGRESS NOTE    Melissa Dickerson  AVW:[id number] DOB: May 03, 1970 DOA: 12/04/2016 PCP: Patient, No Pcp Per    Brief Narrative:  Patient is a 46 year old female history of COPD, schizoaffective disorder presenting with worsening shortness of breath, greenish sputum, wheezing noted to be in acute COPD exacerbation.   Assessment & Plan:   Principal Problem:   COPD exacerbation (HCC) Active Problems:   Schizoaffective disorder, depressive type (HCC)   HTN (hypertension)  #1 acute COPD exacerbation Patient presented with increasing shortness of breath cough of greenish sputum, noted to have some wheezing and noted to be in acute COPD exacerbation. Patient with some clinical improvement. Place on Pulmicort, Mucinex, Flonase, scheduled nebulizer treatments, Claritin, PPI. Continue IV steroid taper, change to 40 mg iv BID . Follow up.CXR showed  CHF , will check echo to evaluate further   #2 schizoaffective disorder Stable. Continue home regimen of Abilify, Cogentin, Depakote, Lexapro.  #3 hypertension Continue HCTZ, lisinopril. Follow.   DVT prophylaxis: SCD Code Status: Full Family Communication: Updated patient. No family at bedside. Disposition Plan: Home when medically stable  ,likely in am .   Consultants:   None  Procedures:   Chest x-ray 12/04/2016    Antimicrobials:   Azithromycin 12/04/2016   Subjective: Denies any cp, less sob, has some pedal edema, feels better than yesterday  Objective: Vitals:   12/05/16 2035 12/06/16 0211 12/06/16 0509 12/06/16 0839  BP:   122/81   Pulse:   75   Resp:   19   Temp:   98.2 F (36.8 C)   TempSrc:   Oral   SpO2: 94% 91% 96% 95%  Weight:      Height:        Intake/Output Summary (Last 24 hours) at 12/06/16 1427 Last data filed at 12/06/16 1051  Gross per 24 hour  Intake              480 ml  Output             1300 ml  Net             -820 ml   Filed Weights   12/04/16 1619  Weight: (!) 136.3 kg (300 lb 6.4 oz)      Examination:  General exam: Appears calm and comfortable.Obese Respiratory system: Minimal expiratory wheezing. No crackles. No rhonchi. On nebulizer.  Cardiovascular system: S1 & S2 heard, RRR. No JVD, murmurs, rubs, gallops or clicks. No pedal edema. Gastrointestinal system: Abdomen is nondistended, soft and nontender. No organomegaly or masses felt. Normal bowel sounds heard. Central nervous system: Alert and oriented. No focal neurological deficits. Extremities: Symmetric 5 x 5 power. Skin: No rashes, lesions or ulcers Psychiatry: Judgement and insight appear normal. Mood & affect appropriate.     Data Reviewed: I have personally reviewed following labs and imaging studies  CBC:  Recent Labs Lab 12/04/16 0952 12/05/16 0417 12/06/16 0406  WBC 9.1 19.7* 17.0*  NEUTROABS 5.4  --  13.8*  HGB 12.5 12.6 12.5  HCT 39.2 40.2 39.5  MCV 91.0 91.6 91.4  PLT 186 233 221   Basic Metabolic Panel:  Recent Labs Lab 12/04/16 0952 12/05/16 0417 12/06/16 0406  NA 141 138 139  K 4.1 4.4 4.4  CL 107 103 101  CO2 GLUCOSE 123* 154* 96  BUN CREATININE 0.77 0.88 0.87  CALCIUM 8.9 9.7 9.3   GFR: Estimated Creatinine Clearance: 114.2 mL/min (by C-G formula based on SCr  of 0.87 mg/dL). Liver Function Tests: No results for input(s): AST, ALT, ALKPHOS, BILITOT, PROT, ALBUMIN in the last 168 hours. No results for input(s): LIPASE, AMYLASE in the last 168 hours. No results for input(s): AMMONIA in the last 168 hours. Coagulation Profile: No results for input(s): INR, PROTIME in the last 168 hours. Cardiac Enzymes: No results for input(s): CKTOTAL, CKMB, CKMBINDEX, TROPONINI in the last 168 hours. BNP (last 3 results) No results for input(s): PROBNP in the last 8760 hours. HbA1C: No results for input(s): HGBA1C in the last 72 hours. CBG:  Recent Labs Lab 12/05/16 1228 12/05/16 1701  GLUCAP 137* 147*   Lipid Profile: No results for input(s): CHOL, HDL,  LDLCALC, TRIG, CHOLHDL, LDLDIRECT in the last 72 hours. Thyroid Function Tests: No results for input(s): TSH, T4TOTAL, FREET4, T3FREE, THYROIDAB in the last 72 hours. Anemia Panel: No results for input(s): VITAMINB12, FOLATE, FERRITIN, TIBC, IRON, RETICCTPCT in the last 72 hours. Sepsis Labs: No results for input(s): PROCALCITON, LATICACIDVEN in the last 168 hours.  No results found for this or any previous visit (from the past 240 hour(s)).       Radiology Studies: No results found.      Scheduled Meds: . ARIPiprazole  10 mg Oral Daily  . azithromycin  250 mg Oral QAC supper  . benztropine  2 mg Oral Daily  . budesonide (PULMICORT) nebulizer solution  0.5 mg Nebulization BID  . divalproex  750 mg Oral QHS  . escitalopram  10 mg Oral Daily  . fluticasone  2 spray Each Nare Daily  . guaiFENesin  1,200 mg Oral BID  . hydrochlorothiazide  25 mg Oral Daily  . ipratropium-albuterol  3 mL Nebulization BID  . lisinopril  20 mg Oral Daily  . loratadine  10 mg Oral Daily  . methylPREDNISolone (SOLU-MEDROL) injection  60 mg Intravenous Q12H  . nicotine  14 mg Transdermal Daily  . pantoprazole  40 mg Oral Q0600  . sodium chloride flush  3 mL Intravenous Q12H   Continuous Infusions: . sodium chloride       LOS: 2 days    Time spent: 35 minutes    Richarda Overlie, MD [facility] Pager [phone]  If 7PM-7AM, please contact night-coverage [email or web address] Password [email or web address] 12/06/2016, 2:27 PM

## 2016-12-07 ENCOUNTER — Inpatient Hospital Stay (HOSPITAL_COMMUNITY): Payer: Medicaid Other

## 2016-12-07 LAB — EXPECTORATED SPUTUM ASSESSMENT W GRAM STAIN, RFLX TO RESP C

## 2016-12-07 LAB — EXPECTORATED SPUTUM ASSESSMENT W REFEX TO RESP CULTURE

## 2016-12-07 MED ORDER — LORATADINE 10 MG PO TABS: 10.0000 mg | ORAL_TABLET | Freq: Every day | ORAL | 0 refills | Status: DC

## 2016-12-07 MED ORDER — BUDESONIDE-FORMOTEROL FUMARATE 160-4.5 MCG/ACT IN AERO: 2.0000 | INHALATION_SPRAY | Freq: Two times a day (BID) | RESPIRATORY_TRACT | 12 refills | Status: DC

## 2016-12-07 MED ORDER — AZITHROMYCIN 250 MG PO TABS: 250.0000 mg | ORAL_TABLET | Freq: Every day | ORAL | 0 refills | Status: AC

## 2016-12-07 MED ORDER — PANTOPRAZOLE SODIUM 40 MG PO TBEC: 40.0000 mg | DELAYED_RELEASE_TABLET | Freq: Every day | ORAL | 0 refills | Status: DC

## 2016-12-07 MED ORDER — NICOTINE 14 MG/24HR TD PT24: 14.0000 mg | MEDICATED_PATCH | Freq: Every day | TRANSDERMAL | 0 refills | Status: DC

## 2016-12-07 MED ORDER — FLUTICASONE PROPIONATE 50 MCG/ACT NA SUSP: 2.0000 | Freq: Every day | NASAL | 0 refills | Status: DC

## 2016-12-07 MED ORDER — GUAIFENESIN ER 600 MG PO TB12: 1200.0000 mg | ORAL_TABLET | Freq: Two times a day (BID) | ORAL | 0 refills | Status: AC

## 2016-12-07 MED ORDER — ALBUTEROL SULFATE HFA 108 (90 BASE) MCG/ACT IN AERS: 1.0000 | INHALATION_SPRAY | Freq: Four times a day (QID) | RESPIRATORY_TRACT | 0 refills | Status: DC | PRN

## 2016-12-07 MED ORDER — PREDNISONE 20 MG PO TABS: 20.0000 mg | ORAL_TABLET | Freq: Every day | ORAL | 0 refills | Status: DC

## 2016-12-07 MED ORDER — GUAIFENESIN ER 600 MG PO TB12: 1200.0000 mg | ORAL_TABLET | Freq: Two times a day (BID) | ORAL | 0 refills | Status: DC

## 2016-12-07 MED ORDER — TIOTROPIUM BROMIDE MONOHYDRATE 18 MCG IN CAPS: 18.0000 ug | ORAL_CAPSULE | Freq: Every day | RESPIRATORY_TRACT | 0 refills | Status: DC

## 2016-12-07 MED ORDER — AZITHROMYCIN 250 MG PO TABS: 250.0000 mg | ORAL_TABLET | Freq: Every day | ORAL | 0 refills | Status: DC

## 2016-12-07 NOTE — Progress Notes (Signed)
Pt d/cd  . She verbalized understanding  Of discharge instructions and her scripts

## 2016-12-07 NOTE — Hospital Discharge Follow-Up (Signed)
Transitional Care Clinic Care Coordination Note:  Admit date:  12/04/2016 Discharge date:TBD  Discharge Disposition: [place] - homeless Patient contact: # [phone] Emergency contact(s): none given  This Case Manager reviewed patient's EMR and determined patient would benefit from post-discharge medical management and chronic care management services through the [facility]. Patient has a history of COPD, schizoaffective disorder and HTN and was admitted with a COPD exacerbation.  She has 6 ED visits and 1 hospitalization since 02/27/2016.. This Case Manager met with patient to discuss the services and medical management that can be provided at the [facility]. Patient verbalized understanding and agreed to receive post-discharge care at the [facility]   Patient scheduled for Transitional Care appointment on 12/18/16 @ 1400.  Clinic information and appointment time provided to patient. Appointment information also placed on AVS.  Assessment:       Home Environment:homeless, staying at [facility]. Stated that she is in the process of locating permanent housing.  She does not have any plan to return to [place] at this time.        Support System: no support in the community other than [place], [place] [facility]        Level of functioning: independent       Home WGN:FAOZ       Home care services: (services arranged prior to discharge or new services after discharge) none       Transportation: uses the city bus.         Food/Nutrition: (ability to afford, access, use of any community resources): stated that she eats 3 meals/day at [employer]  She receives $15/month of food stamps         Medications: (ability to afford, access, compliance, Pharmacy used): currently uses [facility].  Stated that she is not always able to afford her co-pays which are $3.00        Identified Barriers: homeless, limited transportation, lack of  support in  the community, difficulty affording medications, no PCP        PCP (Name, office location, phone number): none   She said that she sees the psychiatrist at  [facility] every 3 months.  Patient Education: explained the services provided at [facility] including Pharmacy, SW and Financial Counseling. She does have medicaid and receives $740/month in disability.  Provided her the phone # for DSS to call for a transportation assessment.  Also explained to her that the [facility] will provide cab services for her to the clinic for appointments  during the first 30 days with the program.  Explained that if she is not able to afford her medications, she can put the charges on an account at [facility] to pay off when she is able.             Arranged services:        Services communicated to/with:  Voicemail message left for Marney Doctor, RN CM   This CM spoke to Sharren Bridge, LCSW to inform her that this CM was going to meet with the patient to discuss follow up care in the community. Lisette Abu, RN had requested that this CM meet with the patient.

## 2016-12-07 NOTE — Clinical Social Work Note (Signed)
Clinical Social Work Assessment  Patient Details  Name: Melissa Dickerson MRN: [id number] Date of Birth: 08-28-1970  Date of referral:  12/07/16               Reason for consult:  Financial Concerns, Housing Concerns/Homelessness                Permission sought to share information with:    Permission granted to share information::     Name::        Agency::     Relationship::     Contact Information:     Housing/Transportation Living arrangements for the past 2 months:  Barrister's clerk of Information:  Patient Patient Interpreter Needed:  None Criminal Activity/Legal Involvement Pertinent to Current Situation/Hospitalization:  No - Comment as needed Significant Relationships:  Warehouse manager Lives with:  Self [employer]) Do you feel safe going back to the place where you live?  Yes Need for family participation in patient care:  No (Coment)  Care giving concerns:  Pt admitted from [facility] (shelter). Reports no caregiving concerns   Facilities manager / plan:  CSW consulted to assess pt's housing needs and access to medications.  Met with pt at bedside. She was alert/orientedx4. Welcoming of CSW involvement. Pt explained she lives at [facility] and "I don't qualify for some of the medications and MD assistance there because I have Medicaid and SSI." States she does have access to the [facility] RN Jordan Hawks) and sees [facility] for her psychiatric medications.  CSW and pt discussed barriers to her paying for her medications (has small copay around $3 per her report). She states that "she lets guys manipulate her, spends all her money buying things for them. If it wasn't for that I could save up my SSI check and take care of my own stuff better." Discussed this at length and pt feels that she "needs to get myself together and stop making bad choices." Discussed that she is not seeing a therapist at this time and agrees she could benefit from working with therapist re:  her relationships and impulsivity. States since she sees MD at [facility] (reports dx of shizoaffective d/o and PTSD) she could see therapist there as well, however she notes she is also interested in [employer]. CSW provided information on AVS. Another barrier pt and CSW discussed is that pt "forgets about my appointments and forgets to set up transportation in time, because I'm all over the place and not organized." Discussed practical options to help with organization such as reminders, calendars, making sure to take medications regularly "so that my mind is more calm." While pt demonstrated no acute symptoms of psychosis/mania, CSW noted that she did demonstrate somewhat rapid speech and disorganized thought process, and she admitted to some difficulty concentrating on one topic. Nonetheless, she demonstrated good insight into her issues by identifying her most pressing barriers to accessing care.  CSW attempted to make contact with [facility] in order to facilitate continuity of care, but was unsuccessful.  Plan: Pt will return to [facility] at [place]. If transportation needs arise, CSW will provide bus passes. Pt reports she has the resources she needs currently to access medical/mental health care and will work toward [place] to pay for her medication copays and avoid "friends who influence me to buy other things instead."  Employment status:  Unemployed Insurance information:  Medicaid In [place] PT Recommendations:  Not assessed at this time Information / Referral to community resources:  Outpatient Psychiatric Care (Comment Required) (pt is seen at [facility] but notes she would like other options. )  Patient/Family's Response to care:  Appreciative of care. Noted she "considered leaving [facility] but I realized that was a bad choice and I needed to stay."  Patient/Family's Understanding of and Emotional Response to Diagnosis, Current Treatment, and Prognosis:  Pt demonstrates adequate understanding  of her treatment and plan. As noted above, thought process is disorganized at times but she shows some insight into her problems and needs. Emotionally, she admitted to being overwhelmed by "how much I need to change." However, she also demonstrated hopefulness and positivity re: the changes she discussed with CSW.  Emotional Assessment Appearance:  Appears stated age Attitude/Demeanor/Rapport:   (engaged) Affect (typically observed):  Overwhelmed, Anxious Orientation:  Oriented to Self, Oriented to Place, Oriented to  Time, Oriented to Situation Alcohol / Substance use:  Not Applicable Psych involvement (Current and /or in the community):  Yes (Comment) Beverly Sessions)  Discharge Needs  Concerns to be addressed:  No discharge needs identified Readmission within the last 30 days:  No Current discharge risk:  None Barriers to Discharge:  Continued Medical Work up   [employer], LCSW 12/07/2016, 10:48 AM  [phone]

## 2016-12-07 NOTE — Discharge Summary (Addendum)
Physician Discharge Summary  Melissa Dickerson UJW:[id number] DOB: 02/08/1971 DOA: 12/04/2016  PCP: Patient, No Pcp Per  Admit date: 12/04/2016 Discharge date: 12/07/2016  Time spent: 50 minutes  Recommendations for Outpatient Follow-up:  1. Follow-up with PCP when she gets back to [place]. On follow-up patient will need a basic metabolic profile done to follow-up on electrolytes and renal function. COPD exacerbation when he to be reassessed. 2. Follow-up with Lavinia Sharps, NP in 2 weeks. On follow-up patient needs a basic metabolic profile done to follow-up on electrolytes and renal function.   Discharge Diagnoses:  Principal Problem:   COPD exacerbation (HCC) Active Problems:   Schizoaffective disorder, depressive type (HCC)   HTN (hypertension)   Discharge Condition: Stable and improved  Diet recommendation: Heart healthy  Filed Weights   12/04/16 1619  Weight: (!) 136.3 kg (300 lb 6.4 oz)    History of present illness:  Per Dr Melissa Dickerson  is a 46 y.o. female, With past medical history significant for COPD and schizoaffective disorder on treatment presenting with few days history of increasing shortness of breath cough with greenish sputum, no fever chills, no chest pains palpitations . Denied any nausea vomiting , denied abdominal pain or diarrhea. Patient received steroids IV no treatments in the ER and she improved slightly. To note is that when picked up by EMS her saturation was 88% on room air.   Hospital Course:  #1 acute COPD exacerbation Patient presented with increasing shortness of breath cough of greenish sputum, noted to have some wheezing and noted to be in acute COPD exacerbation. Patient placed on azithromycin, IV steroids, scheduled nebulizers, Pulmicort, Brovana, Mucinex, Flonase, Claritin. Due to concern for possible volume overload patient received a dose of IV Lasix tapered to oral prednisone. Patient be discharged home on oral prednisone taper, 2  more days of oral azithromycin, Symbicort, Spiriva, a be related inhaler. Repeat chest x-ray on day of discharge was unremarkable. 2-D echo was ordered however patient refused. Patient was discharged home in stable and improved condition to follow-up with PCP in the outpatient setting. with some clinical improvement.   #2 schizoaffective disorder Stable. Patient maintained on home regimen of Abilify, Cogentin, Depakote, Lexapro.  #3 hypertension Patient was maintained on home regimen of HCTZ, lisinopril.   Procedures:  Chest x-ray 12/04/2016,12/07/2016    Consultations:  None  Discharge Exam: Vitals:   12/07/16 0532 12/07/16 0803  BP: (!) 142/75   Pulse: 68   Resp: 16   Temp: 98.1 F (36.7 C)   SpO2: 95% 93%    General: NAD Cardiovascular: RRR Respiratory: Minimal upper lobe expiratory wheezing.  Discharge Instructions   Discharge Instructions    Diet - low sodium heart healthy    Complete by:  As directed    Increase activity slowly    Complete by:  As directed      Current Discharge Medication List    START taking these medications   Details  azithromycin (ZITHROMAX) 250 MG tablet Take 1 tablet (250 mg total) by mouth daily. Qty: 2 each, Refills: 0    budesonide-formoterol (SYMBICORT) 160-4.5 MCG/ACT inhaler Inhale 2 puffs into the lungs 2 (two) times daily. Qty: 1 Inhaler, Refills: 12    fluticasone (FLONASE) 50 MCG/ACT nasal spray Place 2 sprays into both nostrils daily. Qty: 16 g, Refills: 0    guaiFENesin (MUCINEX) 600 MG 12 hr tablet Take 2 tablets (1,200 mg total) by mouth 2 (two) times daily. Use for 2 days then stop. Qty:  8 tablet, Refills: 0    loratadine (CLARITIN) 10 MG tablet Take 1 tablet (10 mg total) by mouth daily. Qty: 30 tablet, Refills: 0    nicotine (NICODERM CQ - DOSED IN MG/24 HOURS) 14 mg/24hr patch Place 1 patch (14 mg total) onto the skin daily. Qty: 28 patch, Refills: 0    pantoprazole (PROTONIX) 40 MG tablet Take 1  tablet (40 mg total) by mouth daily at 6 (six) AM. Qty: 30 tablet, Refills: 0    predniSONE (DELTASONE) 20 MG tablet Take 1-3 tablets (20-60 mg total) by mouth daily with breakfast. Take 3 tablets ( ) daily x 2 days, then 2 tablets ( ) daily x 3 days, then 1 tablet ( ) daily x 3 days then stop. Qty: 15 tablet, Refills: 0    tiotropium (SPIRIVA HANDIHALER) 18 MCG inhalation capsule Place 1 capsule (18 mcg total) into inhaler and inhale daily. Qty: 30 capsule, Refills: 0      CONTINUE these medications which have CHANGED   Details  albuterol (PROVENTIL HFA;VENTOLIN HFA) 108 (90 Base) MCG/ACT inhaler Inhale 1-2 puffs into the lungs every 6 (six) hours as needed for wheezing or shortness of breath. Use 2 puffs 3 times daily x 5 days then every 6 hours as needed. Qty: 1 Inhaler, Refills: 0      CONTINUE these medications which have NOT CHANGED   Details  ARIPiprazole (ABILIFY) 10 MG tablet Take 10 mg by mouth daily.    benztropine (COGENTIN) 2 MG tablet Take 2 mg by mouth daily.    divalproex (DEPAKOTE) 250 MG DR tablet Take 750 mg by mouth at bedtime. Refills: 0    escitalopram (LEXAPRO) 10 MG tablet Take 10 mg by mouth daily. Refills: 0    hydrochlorothiazide (HYDRODIURIL) 25 MG tablet Take 25 mg by mouth daily. Refills: 0    lisinopril (PRINIVIL,ZESTRIL) 20 MG tablet Take 20 mg by mouth daily. Refills: 0      STOP taking these medications     cephALEXin (KEFLEX) 500 MG capsule        Allergies  Allergen Reactions  . Haldol [Haloperidol]     rash  . Pork-Derived Products Other (See Comments)  . Prozac [Fluoxetine Hcl] Rash   Follow-up Information    Placey, Chales Abrahams, NP. Schedule an appointment as soon as possible for a visit in 2 week(s).   Why:  She can become your PCP at the [facility]. Contact information: [address] [place] [address] [phone]        PCP Follow up.   Why:  F/U WITH PCP WHEN YOU GET BACK TO [place] FOR COPD EXAC            The results of significant diagnostics from this hospitalization (including imaging, microbiology, ancillary and laboratory) are listed below for reference.    Significant Diagnostic Studies: Dg Chest 2 View  Result Date: 12/07/2016 CLINICAL DATA:  Increased shortness of breath. EXAM: CHEST  2 VIEW COMPARISON:  12/04/2016 FINDINGS: Lungs are clear without airspace disease or pulmonary edema. No evidence for hyperinflation. Heart and mediastinum are within normal limits. The trachea is midline. Bridging osteophytes in the thoracic spine. No large pleural effusions. IMPRESSION: No active cardiopulmonary disease. Electronically Signed   By: Richarda Overlie M.D.   On: 12/07/2016 10:23   Dg Chest 2 View  Result Date: 12/04/2016 CLINICAL DATA:  New onset cough.  Shortness of breath . EXAM: CHEST  2 VIEW COMPARISON:  11/12/2016 FINDINGS: Cardiomegaly with mild pulmonary vascular prominence and  bilateral cysts prominence suggesting mild CHF. Low lung volumes with mild basilar atelectasis . No pleural effusion or pneumothorax. IMPRESSION: Number Cardiomegaly with mild pulmonary vascular prominence and bilateral pulmonary interstitial prominence suggesting mild CHF. 2.  Low lung volumes with mild basilar atelectasis Electronically Signed   By: Maisie Fus  Register   On: 12/04/2016 10:45   Dg Chest 2 View  Result Date: 11/12/2016 CLINICAL DATA:  Wheezing EXAM: CHEST  2 VIEW COMPARISON:  03/23/2016 FINDINGS: Normal heart size and mediastinal contours. There is no edema, consolidation, effusion, or pneumothorax. There is an 8 mm nodular density partially overlapping the anterior right third rib. Although findings such as this are often artifactual, in retrospect there may have been a nodular density in this region on prior as well. Patient is a smoker. Cholecystectomy clips. IMPRESSION: 1. 8 mm nodular density over the right mid chest. Recommend nonemergent chest CT in this smoker, contrast not essential. 2. No  evidence of active disease. Electronically Signed   By: Marnee Spring M.D.   On: 11/12/2016 14:22    Microbiology: Recent Results (from the past 240 hour(s))  Culture, expectorated sputum-assessment     Status: None   Collection Time: 12/07/16  3:52 AM  Result Value Ref Range Status   Specimen Description SPUTUM  Final   Special Requests NONE  Final   Sputum evaluation THIS SPECIMEN IS ACCEPTABLE FOR SPUTUM CULTURE  Final   Report Status 12/07/2016 FINAL  Final  Culture, respiratory (NON-Expectorated)     Status: None (Preliminary result)   Collection Time: 12/07/16  3:52 AM  Result Value Ref Range Status   Specimen Description SPUTUM  Final   Special Requests NONE Reflexed from [id number]  Final   Gram Stain   Final    MODERATE WBC PRESENT, PREDOMINANTLY MONONUCLEAR RARE SQUAMOUS EPITHELIAL CELLS PRESENT MODERATE GRAM NEGATIVE COCCOBACILLI FEW GRAM POSITIVE COCCI IN PAIRS Performed at [facility], [address], [place], [place] [address]    Culture PENDING  Incomplete   Report Status PENDING  Incomplete     Labs: Basic Metabolic Panel:  Recent Labs Lab 12/04/16 0952 12/05/16 0417 12/06/16 0406  NA 141 138 139  K 4.1 4.4 4.4  CL 107 103 101  CO2 GLUCOSE 123* 154* 96  BUN CREATININE 0.77 0.88 0.87  CALCIUM 8.9 9.7 9.3   Liver Function Tests: No results for input(s): AST, ALT, ALKPHOS, BILITOT, PROT, ALBUMIN in the last 168 hours. No results for input(s): LIPASE, AMYLASE in the last 168 hours. No results for input(s): AMMONIA in the last 168 hours. CBC:  Recent Labs Lab 12/04/16 0952 12/05/16 0417 12/06/16 0406  WBC 9.1 19.7* 17.0*  NEUTROABS 5.4  --  13.8*  HGB 12.5 12.6 12.5  HCT 39.2 40.2 39.5  MCV 91.0 91.6 91.4  PLT 186 233 221   Cardiac Enzymes: No results for input(s): CKTOTAL, CKMB, CKMBINDEX, TROPONINI in the last 168 hours. BNP: BNP (last 3 results)  Recent Labs  12/04/16 0952  BNP 13.6    ProBNP (last 3  results) No results for input(s): PROBNP in the last 8760 hours.  CBG:  Recent Labs Lab 12/05/16 1228 12/05/16 1701  GLUCAP 137* 147*       Signed:  THOMPSON,DANIEL MD.  [facility] 12/07/2016, 3:07 PM

## 2016-12-07 NOTE — Progress Notes (Signed)
Sitting T 98.8 SPO2 94 %  P- 77 .BP 141/75 After walking around the unit .Sitting  SPO 2  96 %  BP 146/73  P-84

## 2016-12-07 NOTE — Progress Notes (Signed)
Melissa Dickerson refused the echocardiogram. She stated that she  Didn't need it or want it.I sent a text to Dr Janee Morn

## 2016-12-07 NOTE — Progress Notes (Signed)
Patient refusing echo 

## 2016-12-09 LAB — CULTURE, RESPIRATORY: CULTURE: NORMAL

## 2016-12-09 LAB — CULTURE, RESPIRATORY W GRAM STAIN

## 2016-12-10 ENCOUNTER — Ambulatory Visit: Payer: Medicaid Other | Attending: Family Medicine | Admitting: Family Medicine

## 2016-12-10 ENCOUNTER — Encounter: Payer: Self-pay | Admitting: Pediatric Intensive Care

## 2016-12-10 ENCOUNTER — Ambulatory Visit: Payer: Medicaid Other | Attending: Family Medicine | Admitting: Licensed Clinical Social Worker

## 2016-12-10 ENCOUNTER — Encounter: Payer: Self-pay | Admitting: Family Medicine

## 2016-12-10 ENCOUNTER — Telehealth: Payer: Self-pay

## 2016-12-10 VITALS — BP 124/81 | HR 79 | Temp 97.7°F | Wt 303.4 lb

## 2016-12-10 DIAGNOSIS — Z9049 Acquired absence of other specified parts of digestive tract: Secondary | ICD-10-CM | POA: Insufficient documentation

## 2016-12-10 DIAGNOSIS — Z716 Tobacco abuse counseling: Secondary | ICD-10-CM | POA: Diagnosis not present

## 2016-12-10 DIAGNOSIS — Z91018 Allergy to other foods: Secondary | ICD-10-CM | POA: Insufficient documentation

## 2016-12-10 DIAGNOSIS — R0902 Hypoxemia: Secondary | ICD-10-CM | POA: Diagnosis not present

## 2016-12-10 DIAGNOSIS — K219 Gastro-esophageal reflux disease without esophagitis: Secondary | ICD-10-CM | POA: Diagnosis not present

## 2016-12-10 DIAGNOSIS — Z79899 Other long term (current) drug therapy: Secondary | ICD-10-CM | POA: Insufficient documentation

## 2016-12-10 DIAGNOSIS — Z720 Tobacco use: Secondary | ICD-10-CM | POA: Diagnosis not present

## 2016-12-10 DIAGNOSIS — I10 Essential (primary) hypertension: Secondary | ICD-10-CM

## 2016-12-10 DIAGNOSIS — F251 Schizoaffective disorder, depressive type: Secondary | ICD-10-CM | POA: Diagnosis not present

## 2016-12-10 DIAGNOSIS — F17200 Nicotine dependence, unspecified, uncomplicated: Secondary | ICD-10-CM | POA: Insufficient documentation

## 2016-12-10 DIAGNOSIS — J441 Chronic obstructive pulmonary disease with (acute) exacerbation: Secondary | ICD-10-CM | POA: Diagnosis not present

## 2016-12-10 DIAGNOSIS — Z888 Allergy status to other drugs, medicaments and biological substances status: Secondary | ICD-10-CM | POA: Insufficient documentation

## 2016-12-10 MED ORDER — NICOTINE 14 MG/24HR TD PT24: 14.0000 mg | MEDICATED_PATCH | Freq: Every day | TRANSDERMAL | 1 refills | Status: DC

## 2016-12-10 MED ORDER — FLUTICASONE PROPIONATE 50 MCG/ACT NA SUSP: 2.0000 | Freq: Every day | NASAL | 5 refills | Status: DC

## 2016-12-10 MED ORDER — IPRATROPIUM-ALBUTEROL 0.5-2.5 (3) MG/3ML IN SOLN: 3.0000 mL | Freq: Four times a day (QID) | RESPIRATORY_TRACT | Status: DC

## 2016-12-10 MED ORDER — BUDESONIDE-FORMOTEROL FUMARATE 160-4.5 MCG/ACT IN AERO: 2.0000 | INHALATION_SPRAY | Freq: Two times a day (BID) | RESPIRATORY_TRACT | 5 refills | Status: DC

## 2016-12-10 MED ORDER — PANTOPRAZOLE SODIUM 40 MG PO TBEC: 40.0000 mg | DELAYED_RELEASE_TABLET | Freq: Every day | ORAL | 5 refills | Status: DC

## 2016-12-10 MED ORDER — METHYLPREDNISOLONE SODIUM SUCC 125 MG IJ SOLR
125.0000 mg | Freq: Once | INTRAMUSCULAR | Status: AC
  Administered 2016-12-10: 125 mg via INTRAMUSCULAR

## 2016-12-10 MED ORDER — IPRATROPIUM-ALBUTEROL 0.5-2.5 (3) MG/3ML IN SOLN
3.0000 mL | Freq: Once | RESPIRATORY_TRACT | Status: AC
  Administered 2016-12-10: 3 mL via RESPIRATORY_TRACT

## 2016-12-10 MED ORDER — TIOTROPIUM BROMIDE MONOHYDRATE 18 MCG IN CAPS: 18.0000 ug | ORAL_CAPSULE | Freq: Every day | RESPIRATORY_TRACT | 5 refills | Status: DC

## 2016-12-10 MED ORDER — PREDNISONE 20 MG PO TABS: 40.0000 mg | ORAL_TABLET | Freq: Every day | ORAL | 0 refills | Status: DC

## 2016-12-10 MED ORDER — LISINOPRIL-HYDROCHLOROTHIAZIDE 20-25 MG PO TABS: 1.0000 | ORAL_TABLET | Freq: Every day | ORAL | 5 refills | Status: DC

## 2016-12-10 MED ORDER — ALBUTEROL SULFATE HFA 108 (90 BASE) MCG/ACT IN AERS: 2.0000 | INHALATION_SPRAY | Freq: Four times a day (QID) | RESPIRATORY_TRACT | 3 refills | Status: DC | PRN

## 2016-12-10 MED FILL — ?PANTOPRAZOLE SOD DR 40MG: 40 MG | 30 days supply | Qty: 30 | Fill #0

## 2016-12-10 MED FILL — LISINOPRIL-HCTZ 20-25 MG TA: 20-25 | 30 days supply | Qty: 30 | Fill #0

## 2016-12-10 MED FILL — !VENTOLIN HFA INHALER: 108 (90 BAS | 25 days supply | Qty: 18 | Fill #0

## 2016-12-10 MED FILL — NICOTINE 14 MG/24HR PATCH: 14 | 28 days supply | Qty: 28 | Fill #0

## 2016-12-10 MED FILL — ?PREDNISONE 20MG TABLET: 20 | 5 days supply | Qty: 10 | Fill #0

## 2016-12-10 MED FILL — SYMBICORT 160-4.5 MCG INH: 160-4.5 | 30 days supply | Qty: 10 | Fill #0

## 2016-12-10 MED FILL — FLUTICASONE PROP 50 MCG SPR: 50 | 30 days supply | Qty: 16 | Fill #0

## 2016-12-10 NOTE — Patient Instructions (Signed)
Chronic Obstructive Pulmonary Disease Chronic obstructive pulmonary disease (COPD) is a common lung condition in which airflow from the lungs is limited. COPD is a general term that can be used to describe many different lung problems that limit airflow, including both chronic bronchitis and emphysema. If you have COPD, your lung function will probably never return to normal, but there are measures you can take to improve lung function and make yourself feel better. What are the causes?  Smoking (common).  Exposure to secondhand smoke.  Genetic problems.  Chronic inflammatory lung diseases or recurrent infections. What are the signs or symptoms?  Shortness of breath, especially with physical activity.  Deep, persistent (chronic) cough with a large amount of thick mucus.  Wheezing.  Rapid breaths (tachypnea).  Gray or bluish discoloration (cyanosis) of the skin, especially in your fingers, toes, or lips.  Fatigue.  Weight loss.  Frequent infections or episodes when breathing symptoms become much worse (exacerbations).  Chest tightness. How is this diagnosed? Your health care provider will take a medical history and perform a physical examination to diagnose COPD. Additional tests for COPD may include:  Lung (pulmonary) function tests.  Chest X-ray.  CT scan.  Blood tests. How is this treated? Treatment for COPD may include:  Inhaler and nebulizer medicines. These help manage the symptoms of COPD and make your breathing more comfortable.  Supplemental oxygen. Supplemental oxygen is only helpful if you have a low oxygen level in your blood.  Exercise and physical activity. These are beneficial for nearly all people with COPD.  Lung surgery or transplant.  Nutrition therapy to gain weight, if you are underweight.  Pulmonary rehabilitation. This may involve working with a team of health care providers and specialists, such as respiratory, occupational, and physical  therapists. Follow these instructions at home:  Take all medicines (inhaled or pills) as directed by your health care provider.  Avoid over-the-counter medicines or cough syrups that dry up your airway (such as antihistamines) and slow down the elimination of secretions unless instructed otherwise by your health care provider.  If you are a smoker, the most important thing that you can do is stop smoking. Continuing to smoke will cause further lung damage and breathing trouble. Ask your health care provider for help with quitting smoking. He or she can direct you to community resources or hospitals that provide support.  Avoid exposure to irritants such as smoke, chemicals, and fumes that aggravate your breathing.  Use oxygen therapy and pulmonary rehabilitation if directed by your health care provider. If you require home oxygen therapy, ask your health care provider whether you should purchase a pulse oximeter to measure your oxygen level at home.  Avoid contact with individuals who have a contagious illness.  Avoid extreme temperature and humidity changes.  Eat healthy foods. Eating smaller, more frequent meals and resting before meals may help you maintain your strength.  Stay active, but balance activity with periods of rest. Exercise and physical activity will help you maintain your ability to do things you want to do.  Preventing infection and hospitalization is very important when you have COPD. Make sure to receive all the vaccines your health care provider recommends, especially the pneumococcal and influenza vaccines. Ask your health care provider whether you need a pneumonia vaccine.  Learn and use relaxation techniques to manage stress.  Learn and use controlled breathing techniques as directed by your health care provider. Controlled breathing techniques include: 1. Pursed lip breathing. Start by breathing in (inhaling)   through your nose for 1 second. Then, purse your lips as  if you were going to whistle and breathe out (exhale) through the pursed lips for 2 seconds. 2. Diaphragmatic breathing. Start by putting one hand on your abdomen just above your waist. Inhale slowly through your nose. The hand on your abdomen should move out. Then purse your lips and exhale slowly. You should be able to feel the hand on your abdomen moving in as you exhale.  Learn and use controlled coughing to clear mucus from your lungs. Controlled coughing is a series of short, progressive coughs. The steps of controlled coughing are: 1. Lean your head slightly forward. 2. Breathe in deeply using diaphragmatic breathing. 3. Try to hold your breath for 3 seconds. 4. Keep your mouth slightly open while coughing twice. 5. Spit any mucus out into a tissue. 6. Rest and repeat the steps once or twice as needed. Contact a health care provider if:  You are coughing up more mucus than usual.  There is a change in the color or thickness of your mucus.  Your breathing is more labored than usual.  Your breathing is faster than usual. Get help right away if:  You have shortness of breath while you are resting.  You have shortness of breath that prevents you from:  Being able to talk.  Performing your usual physical activities.  You have chest pain lasting longer than 5 minutes.  Your skin color is more cyanotic than usual.  You measure low oxygen saturations for longer than 5 minutes with a pulse oximeter. This information is not intended to replace advice given to you by your health care provider. Make sure you discuss any questions you have with your health care provider. Document Released: 11/22/2004 Document Revised: 07/21/2015 Document Reviewed: 10/09/2012 Elsevier Interactive Patient Education  2017 Elsevier Inc.  

## 2016-12-10 NOTE — Progress Notes (Signed)
Pt states she lost all medication on bus and needs refill for everything.

## 2016-12-10 NOTE — BH Specialist Note (Signed)
Integrated Behavioral Health Initial Visit  MRN: [id number] Name: Melissa Dickerson  Number of Integrated Behavioral Health Clinician visits:: 1/6 Session Start time: 2:25 PM  Session End time: 2:55 PM Total time: 30 minutes  Type of Service: Integrated Behavioral Health- Individual/Family Interpretor:No. Interpretor Name and Language: N/A   Warm Hand Off Completed.       SUBJECTIVE: Melissa Dickerson is a 46 y.o. female accompanied by self Patient was referred by Dr. Venetia Night for anxiety and depression. 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 She has a hx of trauma and disclosed that people take advantage of her  GOALS ADDRESSED: Patient will: 1. Reduce symptoms of: anxiety and depression 2. Increase knowledge and/or ability of: coping skills  3. Demonstrate ability to: Increase adequate support systems for patient/family, Improve medication compliance and Decrease self-medicating behaviors  INTERVENTIONS: Interventions utilized: Solution-Focused Strategies, Supportive Counseling, Psychoeducation  and/or Health Education and Link to [facility]  Standardized Assessments completed: GAD-7 and PHQ 2&9 with C-SSRS  ASSESSMENT: Patient currently experiencing depression and anxiety triggered by ongoing medical concerns, limited support, and hx of substance use. Pt reports hx of trauma and SI. She denies current SI/HI/AVH or access to weapons. LCSWA inquired protective factors with pt and provided crisis intervention resources.    Patient may benefit from psychoeducation. LCSWA educated pt on how substance use can negatively impact one's mental and physical health. Pt identified healthy coping skills to decrease symptoms and maintain sobriety. She is receiving assistance with RN at [employer] and is participating in medication management and psychotherapy at [facility] [employer]. LCSWA provided information on initiating supportive services with [facility], per pt's request.     PLAN: 1. Follow up with behavioral health clinician on : Pt was encouraged to contact LCSWA if symptoms worsen or fail to improve to schedule appointments at [facility] or [facility] 2. Behavioral recommendations: LCSWA recommends that pt apply healthy coping skills discussed, comply with medication management and psychotherapy, and utilize provided resources. Pt is encouraged to initiate services with [facility] to strengthen support system 3. Referral(s): Paramedic (LME/Outside Clinic) and [employer] 4. "From scale of 1-10, how likely are you to follow plan?": 10/10  Bridgett Larsson, LCSW 12/13/16 10:54 AM

## 2016-12-10 NOTE — Congregational Nurse Program (Signed)
Congregational Nurse Program Note  Date of Encounter: 12/10/2016  Past Medical History: Past Medical History:  Diagnosis Date  . COPD (chronic obstructive pulmonary disease) (HCC)   . Schizoaffective disorder [facility])     Encounter Details:     CNP Questionnaire - 12/10/16 1000      Patient Demographics   Is this a new or existing patient? Existing   Patient is considered a/an Not Applicable   Race Caucasian/White     Patient Assistance   Location of Patient Assistance [facility]   Patient's financial/insurance status Medicaid;Low Income   Uninsured Patient ([employer] officer, trade union) No   Patient referred to apply for the following financial assistance Not Applicable   Food insecurities addressed Not Applicable   Transportation assistance Yes   Type of Assistance Bus Pass Given   Assistance securing medications No   Educational health offerings Safety;Navigating the healthcare system     Encounter Details   Primary purpose of visit Navigating the Healthcare System;Post ED/Hospitalization Visit   Was an Emergency Department visit averted? Not Applicable   Does patient have a medical provider? Yes   Patient referred to Follow up with established PCP   Was a mental health screening completed? (GAINS tool) No   Does patient have dental issues? No   Does patient have vision issues? No   Does your patient have an abnormal blood pressure today? No   Since previous encounter, have you referred patient for abnormal blood pressure that resulted in a new diagnosis or medication change? No   Does your patient have an abnormal blood glucose today? No   Since previous encounter, have you referred patient for abnormal blood glucose that resulted in a new diagnosis or medication change? No   Was there a life-saving intervention made? No     Follow up visit after hospital discharge. CN spoke with client and CHW nurse case manager regarding follow up clinic appointment as client lost her  medication. Client sates she would like to go to a group home and have someone manage her disability payments until she is more "stable". CN called [facility] with client to arrange appointnment on 10/17 at 1pm. Client will speak to SW at PCP appointment this afternoon. Bus passes given with written bus directions. Client states she will be able to take bus to clinic and will leave shelter at 12. Client agrees to return to shelter clinic tomorrow to discuss follow up.

## 2016-12-10 NOTE — Telephone Encounter (Signed)
Transitional Care Clinic Post-discharge Follow-Up Phone Call:  Date of Discharge:   12/07/2016  Principal Discharge Diagnosis(es): COPD exacerbation, Schizoaffective disorder, HTN Post-discharge Communication: (Clearly document all attempts clearly and date contact made) call received from the patient Call Completed: Yes                  With Whom: Patient and Shann Medal, RN/[facility] Interpreter Needed: No     Please check all that apply:  ? Patient is knowledgeable of his/her condition(s) and/or treatment. ? Patient is caring for self at home.  X  Patient is receiving assist at home from family and/or caregiver. Family and/or caregiver is knowledgeable of patient's condition(s) and/or treatment. - She is currently homeless and staying at the [facility].  She hopes to be able to move into a group home.  ? Patient is receiving home health services. If so, name of agency.     Medication Reconciliation:  ? Medication list reviewed with patient.  X  Patient obtained all discharge medications. If not, why?  - She called to inform this CM that she lost of her prescriptions that she was given at discharge and she has no medications.  She said that she left the prescriptions on the bus.  She was agreeable to coming to [facility] today to see Dr Venetia Night and have her prescriptions ordered. [place] [facility] to arrange for transportation to the clinic as needed.    Activities of Daily Living:  ? Independent X  Needs assist (describe; ? home DME used) needs assistance with organizing and managing her daily needs/tasks ? [facility] (describe, ? home DME used)   Community resources in place for patient:  X  Shann Medal, RN [facility].   ? Home Health/Home DME ? Assisted Living ? Support Group          Patient Education:  Jenel Lucks, LCSW/[facility] notified that the patient would like to speak to her about a obtaining a payee and resources for group home  The patient has an appointment  with [facility] on 12/12/16 .

## 2016-12-11 ENCOUNTER — Telehealth: Payer: Self-pay

## 2016-12-11 ENCOUNTER — Encounter: Payer: Self-pay | Admitting: Pediatric Intensive Care

## 2016-12-11 NOTE — Congregational Nurse Program (Signed)
Congregational Nurse Program Note  Date of Encounter: 12/11/2016  Past Medical History: Past Medical History:  Diagnosis Date  . COPD (chronic obstructive pulmonary disease) (HCC)   . Schizoaffective disorder [facility])     Encounter Details:     CNP Questionnaire - 12/11/16 1015      Patient Demographics   Is this a new or existing patient? Existing   Patient is considered a/an Not Applicable   Race Caucasian/White     Patient Assistance   Location of Patient Assistance [place]   Patient's financial/insurance status Low Income;Medicaid   Uninsured Patient ([employer], trade [employer]) No   Patient referred to apply for the following financial assistance Not Applicable   Food insecurities addressed Not Applicable   Transportation assistance Yes   Type of Assistance Bus Pass Given   Assistance securing medications Yes   Type of Assistance Other   Biomedical engineer;Interpersonal relationships;Navigating the healthcare system;Medications     Encounter Details   Primary purpose of visit Chronic Illness/Condition Visit;Education/Health Concerns;Safety;Post PCP Visit   Was an Emergency Department visit averted? Not Applicable   Does patient have a medical provider? Yes   Patient referred to Follow up with established PCP   Was a mental health screening completed? (GAINS tool) No   Does patient have dental issues? No   Does patient have vision issues? No   Does your patient have an abnormal blood pressure today? Yes   Since previous encounter, have you referred patient for abnormal blood pressure that resulted in a new diagnosis or medication change? No   Does your patient have an abnormal blood glucose today? No   Since previous encounter, have you referred patient for abnormal blood glucose that resulted in a new diagnosis or medication change? No   Was there a life-saving intervention made? No         Clinical Intake - 12/10/16 1403      Pre-visit preparation   Pre-visit preparation completed Yes     Pain   Pain  No/denies pain     Nutrition Screen   Diabetes No     Functional Status   Activities of Daily Living Independent   Ambulation Independent   Medication Administration Independent   Home Management Independent     Risk/Barriers   Barriers to Care Management & Learning None     Abuse/Neglect   Do you feel unsafe in your current relationship? No   Do you feel physically threatened by others? No   Anyone hurting you at home, work, or school? No   Unable to ask? No     Web designer Needed? No    Clinic follow up after PCP appointment. Client presented appointment card for next PCP appointment. Client is crying and states that she has a lot of emotions to process. CN assisted with deep breathing technique. CN assisted to verify [facility] appointment for 10/17 at 1300. Bus passes given. Client to check in with [facility] tomorrow and daily. CN will speak with Robyne Peers CM at [facility] clinic regarding coordinating care for client. Client will contact Social Security regarding award letter to obtain inhaler at [employer].

## 2016-12-11 NOTE — Progress Notes (Signed)
TRANSITIONAL CARE CLINIC  Date of Telephone Encounter:12/10/16  Date of 1st service:12/10/16   Admit Date:12/04/16 Discharge Date: 12/07/16  PCP: None    Subjective:  Patient ID: Melissa Dickerson, female    DOB: [birth year]  Age: 46 y.o. MRN: [id number]  CC: Asthma and Medication Refill   HPI Melissa Dickerson is a 46 year old female with Schizoaffective disorder, Hypertension, COPD who moved to [place] from [place] one year ago and presents today to get established at the transitional care clinic after a hospitalization for COPD exacerbation.  She had presented with increasing shortness of breath, cough, hypoxia.  CXR revealed cardiomegaly with mild pulmonary vascular prominence, bilateral interstitial prominence suggesting mild CHF. Low lung volumes with mild basilar atelectasis. She was placed on Azithromycin, IV steroids, nebulizer treatments with Pulmicort, Brovana and Lasix was administered due to concern for volume overload. Repeat CXR was revealed no active cardiopulmonary disease; 2d echo was ordered which she declined. She was subsequently discharged on a prednisone taper and her regular medications.  Today she presents after loosing all her prescriptions on the bus and complains of cough, dyspnea and wheezing but denies chest pain.  Oxygen saturation reads 79% but she does not appear to be in distress. Solumedrol and nebulizer treatments administered. She receives mental health care at [facility].  Past Medical History:  Diagnosis Date  . COPD (chronic obstructive pulmonary disease) (HCC)   . Schizoaffective disorder [facility])     Past Surgical History:  Procedure Laterality Date  . CHOLECYSTECTOMY      Allergies  Allergen Reactions  . Haldol [Haloperidol]     rash  . Pork-Derived Products Other (See Comments)  . Prozac [Fluoxetine Hcl] Rash     Outpatient Medications Prior to Visit  Medication Sig Dispense Refill  . ARIPiprazole (ABILIFY) 10 MG tablet Take 10  mg by mouth daily.    . benztropine (COGENTIN) 2 MG tablet Take 2 mg by mouth daily.    . divalproex (DEPAKOTE) 250 MG DR tablet Take 750 mg by mouth at bedtime.  0  . escitalopram (LEXAPRO) 10 MG tablet Take 10 mg by mouth daily.  0  . loratadine (CLARITIN) 10 MG tablet Take 1 tablet (10 mg total) by mouth daily. (Patient not taking: Reported on 12/10/2016) 30 tablet 0  . albuterol (PROVENTIL HFA;VENTOLIN HFA) 108 (90 Base) MCG/ACT inhaler Inhale 1-2 puffs into the lungs every 6 (six) hours as needed for wheezing or shortness of breath. Use 2 puffs 3 times daily x 5 days then every 6 hours as needed. (Patient not taking: Reported on 12/10/2016) 1 Inhaler 0  . budesonide-formoterol (SYMBICORT) 160-4.5 MCG/ACT inhaler Inhale 2 puffs into the lungs 2 (two) times daily. (Patient not taking: Reported on 12/10/2016) 1 Inhaler 12  . fluticasone (FLONASE) 50 MCG/ACT nasal spray Place 2 sprays into both nostrils daily. (Patient not taking: Reported on 12/10/2016) 16 g 0  . hydrochlorothiazide (HYDRODIURIL) 25 MG tablet Take 25 mg by mouth daily.  0  . lisinopril (PRINIVIL,ZESTRIL) 20 MG tablet Take 20 mg by mouth daily.  0  . nicotine (NICODERM CQ - DOSED IN MG/24 HOURS) 14 mg/24hr patch Place 1 patch (14 mg total) onto the skin daily. (Patient not taking: Reported on 12/10/2016) 28 patch 0  . pantoprazole (PROTONIX) 40 MG tablet Take 1 tablet (40 mg total) by mouth daily at 6 (six) AM. (Patient not taking: Reported on 12/10/2016) 30 tablet 0  . predniSONE (DELTASONE) 20 MG tablet Take 1-3 tablets (20-60 mg total) by  mouth daily with breakfast. Take 3 tablets ( ) daily x 2 days, then 2 tablets ( ) daily x 3 days, then 1 tablet ( ) daily x 3 days then stop. (Patient not taking: Reported on 12/10/2016) 15 tablet 0  . tiotropium (SPIRIVA HANDIHALER) 18 MCG inhalation capsule Place 1 capsule (18 mcg total) into inhaler and inhale daily. (Patient not taking: Reported on 12/10/2016) 30 capsule 0   No  facility-administered medications prior to visit.     ROS Review of Systems  Constitutional: Negative for activity change, appetite change and fatigue.  HENT: Negative for congestion, sinus pressure and sore throat.   Eyes: Negative for visual disturbance.  Respiratory: Positive for cough, shortness of breath and wheezing. Negative for chest tightness.   Cardiovascular: Negative for chest pain and palpitations.  Gastrointestinal: Negative for abdominal distention, abdominal pain and constipation.  Endocrine: Negative for polydipsia.  Genitourinary: Negative for dysuria and frequency.  Musculoskeletal: Negative for arthralgias and back pain.  Skin: Negative for rash.  Neurological: Negative for tremors, light-headedness and numbness.  Hematological: Does not bruise/bleed easily.  Psychiatric/Behavioral: Negative for agitation and behavioral problems.    Objective:  BP 124/81   Pulse 79   Temp 97.7 F (36.5 C) (Oral)   Wt (!) 303 lb 6.4 oz (137.6 kg)   LMP 12/06/2016   SpO2 (!) 79%   BMI 47.52 kg/m   BP/Weight 12/10/2016 12/07/2016 12/04/2016  Systolic BP 124 140 152  Diastolic BP 81 72 86  Wt. (Lbs) 303.4 - 300.4  BMI 47.52 - 47.05     Physical Exam  Constitutional: She is oriented to person, place, and time. She appears well-developed and well-nourished.  Cardiovascular: Normal rate, normal heart sounds and intact distal pulses.   No murmur heard. Pulmonary/Chest: Effort normal. She has wheezes (in all lung zones bilaterally). She has no rales. She exhibits no tenderness.  Abdominal: Soft. Bowel sounds are normal. She exhibits no distension and no mass. There is no tenderness.  Musculoskeletal: Normal range of motion.  Neurological: She is alert and oriented to person, place, and time.  Skin: Skin is warm and dry.  Psychiatric: She has a normal mood and affect.     Assessment & Plan:   1. COPD exacerbation (HCC) Acute exacerbation secondary to not picking up her  medications which I have refilled IM Solumedrol ,  duoneb administered in the clinic Will reassess at next OV  Advised that smoking cessation will retard progression of condition - albuterol (PROVENTIL HFA;VENTOLIN HFA) 108 (90 Base) MCG/ACT inhaler; Inhale 2 puffs into the lungs every 6 (six) hours as needed for wheezing or shortness of breath.  Dispense: 1 Inhaler; Refill: 3 - tiotropium (SPIRIVA HANDIHALER) 18 MCG inhalation capsule; Place 1 capsule (18 mcg total) into inhaler and inhale daily.  Dispense: 30 capsule; Refill: 5 - budesonide-formoterol (SYMBICORT) 160-4.5 MCG/ACT inhaler; Inhale 2 puffs into the lungs 2 (two) times daily.  Dispense: 1 Inhaler; Refill: 5 - predniSONE (DELTASONE) 20 MG tablet; Take 2 tablets (40 mg total) by mouth daily with breakfast.  Dispense: 10 tablet; Refill: 0 - methylPREDNISolone sodium succinate (SOLU-MEDROL) 125 mg/2 mL injection 125 mg; Inject 2 mLs (125 mg total) into the muscle once. - ipratropium-albuterol (DUONEB) 0.5-2.5 (3) MG/3ML nebulizer solution 3 mL; Take 3 mLs by nebulization once.  2. Hypertension, unspecified type Controlled Low sodium, DASH diet, lifestyle modifications - lisinopril-hydrochlorothiazide (PRINZIDE,ZESTORETIC) 20-25 MG tablet; Take 1 tablet by mouth daily.  Dispense: 30 tablet; Refill: 5  3. Schizoaffective disorder, depressive type (  HCC) Elevated PHQ 9 score of 22 Currently on Lexapro, Abilify, Congentin Seen by LCSW today  4. Tobacco abuse Smoking cessation support: smoking cessation hotline: 1-800-QUIT-NOW.  Smoking cessation classes are available through [facility]. Call [phone] or visit our website at [email or web address].  Spent 3 minutes counseling on smoking cessation and patient is ready to quit. - nicotine (NICODERM CQ - DOSED IN MG/24 HOURS) 14 mg/24hr patch; Place 1 patch (14 mg total) onto the skin daily.  Dispense: 28 patch; Refill: 1  5. Gastroesophageal reflux  disease without esophagitis Controlled - pantoprazole (PROTONIX) 40 MG tablet; Take 1 tablet (40 mg total) by mouth daily.  Dispense: 30 tablet; Refill: 5   Meds ordered this encounter  Medications  . albuterol (PROVENTIL HFA;VENTOLIN HFA) 108 (90 Base) MCG/ACT inhaler    Sig: Inhale 2 puffs into the lungs every 6 (six) hours as needed for wheezing or shortness of breath.    Dispense:  1 Inhaler    Refill:  3  . nicotine (NICODERM CQ - DOSED IN MG/24 HOURS) 14 mg/24hr patch    Sig: Place 1 patch (14 mg total) onto the skin daily.    Dispense:  28 patch    Refill:  1  . tiotropium (SPIRIVA HANDIHALER) 18 MCG inhalation capsule    Sig: Place 1 capsule (18 mcg total) into inhaler and inhale daily.    Dispense:  30 capsule    Refill:  5  . budesonide-formoterol (SYMBICORT) 160-4.5 MCG/ACT inhaler    Sig: Inhale 2 puffs into the lungs 2 (two) times daily.    Dispense:  1 Inhaler    Refill:  5  . fluticasone (FLONASE) 50 MCG/ACT nasal spray    Sig: Place 2 sprays into both nostrils daily.    Dispense:  16 g    Refill:  5  . pantoprazole (PROTONIX) 40 MG tablet    Sig: Take 1 tablet (40 mg total) by mouth daily.    Dispense:  30 tablet    Refill:  5  . predniSONE (DELTASONE) 20 MG tablet    Sig: Take 2 tablets (40 mg total) by mouth daily with breakfast.    Dispense:  10 tablet    Refill:  0  . lisinopril-hydrochlorothiazide (PRINZIDE,ZESTORETIC) 20-25 MG tablet    Sig: Take 1 tablet by mouth daily.    Dispense:  30 tablet    Refill:  5  . methylPREDNISolone sodium succinate (SOLU-MEDROL) 125 mg/2 mL injection 125 mg  . DISCONTD: ipratropium-albuterol (DUONEB) 0.5-2.5 (3) MG/3ML nebulizer solution 3 mL  . ipratropium-albuterol (DUONEB) 0.5-2.5 (3) MG/3ML nebulizer solution 3 mL    Follow-up: Return in about 1 week (around 12/17/2016) for TCC - follow up of COPD.   Jaclyn Shaggy MD

## 2016-12-11 NOTE — Telephone Encounter (Signed)
This CM spoke to [place], [place].  She explained that she met with the patient this morning and she is going to have the patient check in with the SW intern daily during the week. Focus on helping to guide her in making good decisions. Jordan Hawks is also going to assist the patient with organizing her medications with bags and a notebook as the patient has requested.  The patient expressed an interest in [facility] after speaking with Christa See, LCSW [facility] yesterday and she is scheduled for an intake with [facility] on 12/17/16 @ 0900. They may be able to assist her with the process of locating an appropriate group home. The patient has bus passes to get to this appointment.  She has an appointment with [facility] tomorrow and has bus passes to get to the appointment.  [place] also noted that the patient needs to get a letter from [employer] in order to obtain her symbicort.  The patient needs clarification of this request.  This CM spoke to Baldo Ash, [place] regarding the letter that the patient needs from [place].  [place] stated that she could check with Dr Jarold Song to see if the patient could use an alternate inhaler that would not require her to go to DSS. This would be much easier for the patient.  Update provided to V. Red Christians, RN

## 2016-12-12 NOTE — Telephone Encounter (Signed)
As per Adrian Blackwaterhristine Epachin, Pharmacy Tech, the patient has medicaid and will not be able to participate in the PASS program.  Awaiting prior authorization for the medication from [facility], [facility].  Update provided to [place]Victoria Hussey, [employer] Alben Spittle/[facility]

## 2016-12-13 ENCOUNTER — Encounter: Payer: Self-pay | Admitting: Pharmacist

## 2016-12-13 NOTE — Progress Notes (Signed)
Prior authorization completed and approved for Symbicort through [place] Medicaid x 24 months. Approval # [id number]

## 2016-12-14 ENCOUNTER — Other Ambulatory Visit: Payer: Self-pay | Admitting: Family Medicine

## 2016-12-17 ENCOUNTER — Telehealth: Payer: Self-pay

## 2016-12-17 NOTE — Telephone Encounter (Signed)
Attempted to contact the patient to check on her and to confirm [facility] - 12/18/16 @ 1400.  Call placed to # [phone] (H) and a HIPAA compliant voicemail message was left requesting a call back to # [phone].   As per Shann MedalVictoria Hussey, RN/ [facility], the patient will need transportation to the clinic. [place], [facility] Front Office Team Lead notified of the need for cab transportation to the clinic.

## 2016-12-18 ENCOUNTER — Telehealth: Payer: Self-pay

## 2016-12-18 ENCOUNTER — Telehealth: Payer: Self-pay | Admitting: Family Medicine

## 2016-12-18 ENCOUNTER — Inpatient Hospital Stay: Payer: Self-pay | Admitting: Family Medicine

## 2016-12-18 NOTE — Telephone Encounter (Signed)
[facility] #[phone] and spoke with Marlis EdelsonPat Paris (intake coordinator), she informed me that patient was a no show to her appointment yesterday. She had no phone number to reach her at. She asked that if we get in touch with patient to have her reschedule her intake appointment with them.

## 2016-12-18 NOTE — Telephone Encounter (Signed)
Message received from [facility] Hus[facility], RN/[facility] noting that the patient did not check in at  the shelter the last couple of nights. The staff said she came there yesterday and said she was going back to [place].

## 2016-12-18 NOTE — Telephone Encounter (Signed)
Call placed to patient #[phone] to remind her of her appointment today and to check on her status. No answer. Left patient a message to return my call at [phone].

## 2016-12-21 ENCOUNTER — Encounter: Payer: Self-pay | Admitting: Pediatric Intensive Care

## 2016-12-21 ENCOUNTER — Telehealth: Payer: Self-pay | Admitting: Family Medicine

## 2016-12-21 ENCOUNTER — Telehealth: Payer: Self-pay

## 2016-12-21 NOTE — Telephone Encounter (Signed)
Call placed to patient # [phone] to check on her status and reschedule her appointment. No answer. Left patient a message to return my call at [phone].

## 2016-12-21 NOTE — Telephone Encounter (Signed)
Call received from [facility], [place].  She met with the patient in her clinic today. She explained that the patient has not gone to [place] yet but is still considering re-locating.  The patient stated that she would follow up with [facility] for her behavioral health medications. The patient also acknowledged that she missed her appointment at [facility] this week and was not interested in their services at this time.  She also did not want to re-schedule an appointment at [facility] and said she would call [facility] herself when she is ready for an appointment.

## 2016-12-25 ENCOUNTER — Encounter (HOSPITAL_COMMUNITY): Payer: Self-pay | Admitting: Emergency Medicine

## 2016-12-25 ENCOUNTER — Emergency Department (HOSPITAL_COMMUNITY): Payer: Medicaid Other

## 2016-12-25 DIAGNOSIS — I10 Essential (primary) hypertension: Secondary | ICD-10-CM | POA: Insufficient documentation

## 2016-12-25 DIAGNOSIS — F17210 Nicotine dependence, cigarettes, uncomplicated: Secondary | ICD-10-CM | POA: Insufficient documentation

## 2016-12-25 DIAGNOSIS — J449 Chronic obstructive pulmonary disease, unspecified: Secondary | ICD-10-CM | POA: Diagnosis not present

## 2016-12-25 DIAGNOSIS — R0602 Shortness of breath: Secondary | ICD-10-CM | POA: Diagnosis present

## 2016-12-25 DIAGNOSIS — Z79899 Other long term (current) drug therapy: Secondary | ICD-10-CM | POA: Diagnosis not present

## 2016-12-25 NOTE — ED Triage Notes (Signed)
Patient c/o SOB since last night. Hx COPD. Denies cough and chest pain. Speaking in full sentences without difficulty.

## 2016-12-26 ENCOUNTER — Emergency Department (HOSPITAL_COMMUNITY)
Admission: EM | Admit: 2016-12-26 | Discharge: 2016-12-26 | Disposition: A | Discharge status: Home / Self Care | Payer: Medicaid Other | Attending: Emergency Medicine | Admitting: Emergency Medicine

## 2016-12-26 DIAGNOSIS — J441 Chronic obstructive pulmonary disease with (acute) exacerbation: Secondary | ICD-10-CM

## 2016-12-26 MED ORDER — ALBUTEROL SULFATE HFA 108 (90 BASE) MCG/ACT IN AERS: 2.0000 | INHALATION_SPRAY | RESPIRATORY_TRACT | 0 refills | Status: DC | PRN

## 2016-12-26 MED ORDER — DEXAMETHASONE SODIUM PHOSPHATE 10 MG/ML IJ SOLN
10.0000 mg | Freq: Once | INTRAMUSCULAR | Status: DC
  Filled 2016-12-26: qty 1

## 2016-12-26 MED ORDER — ALBUTEROL SULFATE HFA 108 (90 BASE) MCG/ACT IN AERS
2.0000 | INHALATION_SPRAY | Freq: Once | RESPIRATORY_TRACT | Status: AC
  Administered 2016-12-26: 2 via RESPIRATORY_TRACT
  Filled 2016-12-26: qty 6.7

## 2016-12-26 MED ORDER — DOXYCYCLINE HYCLATE 100 MG PO CAPS: 100.0000 mg | ORAL_CAPSULE | Freq: Two times a day (BID) | ORAL | 0 refills | Status: DC

## 2016-12-26 MED ORDER — DEXAMETHASONE SODIUM PHOSPHATE 10 MG/ML IJ SOLN
10.0000 mg | Freq: Once | INTRAMUSCULAR | Status: AC
  Administered 2016-12-26: 10 mg via INTRAVENOUS

## 2016-12-26 MED ORDER — IPRATROPIUM-ALBUTEROL 0.5-2.5 (3) MG/3ML IN SOLN
3.0000 mL | Freq: Once | RESPIRATORY_TRACT | Status: AC
  Administered 2016-12-26: 3 mL via RESPIRATORY_TRACT
  Filled 2016-12-26: qty 3

## 2016-12-26 NOTE — ED Notes (Signed)
Patient tolerated ambulating in the hallway. Portable O2 sat showed saturations between 80-92. Levels would drop and then quickly increase.

## 2016-12-26 NOTE — ED Provider Notes (Signed)
[facility]-EMERGENCY DEPT Provider Note   CSN: [id number] Arrival date & time: 12/25/16  1729     History   Chief Complaint Chief Complaint  Patient presents with  . Shortness of Breath    HPI Melissa Dickerson is a 46 y.o. female.  HPI  This is a 46 year old female with a history of COPD who presents with shortness of breath.  Patient is homeless and currently living in a tent.  She was admitted earlier in October for a COPD exacerbation.  At that time she was discharged on a burst dose of steroids.  She reports that she uses 2 inhalers daily.  Over the last 24-48 hours she has had worsening shortness of breath.  Denies cough, chest pain, or fevers.  She has used her inhaler with minimal improvement.  She denies any lower extremity swelling.  Past Medical History:  Diagnosis Date  . COPD (chronic obstructive pulmonary disease) (HCC)   . Schizoaffective disorder [facility](HCC)     Patient Active Problem List   Diagnosis Date Noted  . Tobacco abuse 12/10/2016  . GERD (gastroesophageal reflux disease) 12/10/2016  . HTN (hypertension) 12/05/2016  . COPD exacerbation (HCC) 12/04/2016  . Schizoaffective disorder, depressive type (HCC) 03/12/2016    Past Surgical History:  Procedure Laterality Date  . CHOLECYSTECTOMY      OB History    No data available       Home Medications    Prior to Admission medications   Medication Sig Start Date End Date Taking? Authorizing Provider  albuterol (PROVENTIL HFA;VENTOLIN HFA) 108 (90 Base) MCG/ACT inhaler Inhale 2 puffs into the lungs every 6 (six) hours as needed for wheezing or shortness of breath. 12/10/16  Yes Jaclyn ShaggyAmao, Enobong, MD  ARIPiprazole (ABILIFY) 10 MG tablet Take 10 mg by mouth daily.   Yes [provider]  benztropine (COGENTIN) 2 MG tablet Take 2 mg by mouth daily.   Yes [provider]  budesonide-formoterol (SYMBICORT) 160-4.5 MCG/ACT inhaler Inhale 2 puffs into the lungs 2 (two) times  daily. 12/10/16  Yes Jaclyn ShaggyAmao, Enobong, MD  divalproex (DEPAKOTE) 250 MG DR tablet Take 750 mg by mouth at bedtime. 11/24/16  Yes [provider]  escitalopram (LEXAPRO) 10 MG tablet Take 10 mg by mouth daily. 11/24/16  Yes [provider]  fluticasone (FLONASE) 50 MCG/ACT nasal spray Place 2 sprays into both nostrils daily. 12/10/16  Yes Jaclyn ShaggyAmao, Enobong, MD  lisinopril-hydrochlorothiazide (PRINZIDE,ZESTORETIC) 20-25 MG tablet Take 1 tablet by mouth daily. 12/10/16  Yes Jaclyn ShaggyAmao, Enobong, MD  pantoprazole (PROTONIX) 40 MG tablet Take 1 tablet (40 mg total) by mouth daily. 12/10/16  Yes Jaclyn ShaggyAmao, Enobong, MD  tiotropium (SPIRIVA HANDIHALER) 18 MCG inhalation capsule Place 1 capsule (18 mcg total) into inhaler and inhale daily. 12/10/16  Yes Jaclyn ShaggyAmao, Enobong, MD  albuterol (PROVENTIL HFA;VENTOLIN HFA) 108 (90 Base) MCG/ACT inhaler Inhale 2 puffs into the lungs every 4 (four) hours as needed for wheezing or shortness of breath. 12/26/16   Rida Loudin, Mayer Maskerourtney F, MD  doxycycline (VIBRAMYCIN) 100 MG capsule Take 1 capsule (100 mg total) by mouth 2 (two) times daily. 12/26/16   Odie Rauen, Mayer Maskerourtney F, MD  nicotine (NICODERM CQ - DOSED IN MG/24 HOURS) 14 mg/24hr patch Place 1 patch (14 mg total) onto the skin daily. 12/10/16   Jaclyn ShaggyAmao, Enobong, MD    Family History History reviewed. No pertinent family history.  Social History Social History  Substance Use Topics  . Smoking status: Current Every Day Smoker    Packs/day: 0.15  Types: Cigarettes  . Smokeless tobacco: Never Used  . Alcohol use No     Allergies   Haldol [haloperidol]; Pork-derived products; and Prozac [fluoxetine hcl]   Review of Systems Review of Systems  Constitutional: Negative for fever.  Respiratory: Positive for shortness of breath. Negative for cough.   Cardiovascular: Negative for chest pain and leg swelling.  Gastrointestinal: Negative for abdominal pain, nausea and vomiting.  All other systems reviewed and are  negative.    Physical Exam Updated Vital Signs BP (!) 113/57   Pulse 78   Temp 97.9 F (36.6 C) (Oral)   Resp 19   Ht 5\' 7"  (1.702 m)   Wt (!) 175.4 kg (386 lb 9.6 oz)   LMP 12/06/2016   SpO2 98%   BMI 60.55 kg/m   Physical Exam  Constitutional: She is oriented to person, place, and time.  Obese, nontoxic-appearing, no acute distress  HENT:  Head: Normocephalic and atraumatic.  Cardiovascular: Normal rate, regular rhythm and normal heart sounds.   Pulmonary/Chest: Effort normal. No respiratory distress. She has wheezes.  Diffuse expiratory wheeze right lung fields greater than left, fair air movement  Abdominal: Soft. Bowel sounds are normal. There is no tenderness.  Musculoskeletal:  Trace bilateral lower extremity edema  Neurological: She is alert and oriented to person, place, and time.  Skin: Skin is warm and dry.  Psychiatric: She has a normal mood and affect.  Nursing note and vitals reviewed.    ED Treatments / Results  Labs (all labs ordered are listed, but only abnormal results are displayed) Labs Reviewed - No data to display  EKG  EKG Interpretation  Date/Time:  Tuesday December 25 2016 18:08:09 EDT Ventricular Rate:  81 PR Interval:    QRS Duration: 88 QT Interval:  367 QTC Calculation: 426 R Axis:   -8 Text Interpretation:  Sinus rhythm Confirmed by Ross Marcus ([id number]) on 12/26/2016 12:49:48 AM       Radiology Dg Chest 2 View  Result Date: 12/25/2016 CLINICAL DATA:  Shortness of breath starting today.  Weight loss. EXAM: CHEST  2 VIEW COMPARISON:  12/07/2016. FINDINGS: The heart size and mediastinal contours are within normal limits. Both lungs are clear. The visualized skeletal structures are unremarkable. IMPRESSION: No active cardiopulmonary disease.  Stable appearance from priors. Electronically Signed   By: Elsie Stain M.D.   On: 12/25/2016 18:35    Procedures Procedures (including critical care time)  Medications Ordered in  ED Medications  albuterol (PROVENTIL HFA;VENTOLIN HFA) 108 (90 Base) MCG/ACT inhaler 2 puff (not administered)  ipratropium-albuterol (DUONEB) 0.5-2.5 (3) MG/3ML nebulizer solution 3 mL (3 mLs Nebulization Given 12/26/16 0214)  dexamethasone (DECADRON) injection 10 mg (10 mg Intravenous Given 12/26/16 0214)  ipratropium-albuterol (DUONEB) 0.5-2.5 (3) MG/3ML nebulizer solution 3 mL (3 mLs Nebulization Given 12/26/16 0323)     Initial Impression / Assessment and Plan / ED Course  I have reviewed the triage vital signs and the nursing notes.  Pertinent labs & imaging results that were available during my care of the patient were reviewed by me and considered in my medical decision making (see chart for details).  Clinical Course as of Dec 26 508  Wed Dec 26, 2016  6045 Patient feels improved after DuoNeb.  However, with ambulation she drops her O2 sats in the low 80s.  Will repeat DuoNeb and continue to monitor.  [CH]    Clinical Course User Index [CH] Aanya Haynes, Mayer Masker, MD    Patient presents with shortness of  breath.  Wheezing on exam but otherwise nontoxic.  No respiratory distress.  Patient was initially given a DuoNeb and Decadron.  Chest x-ray shows no evidence of pneumonia.  EKG is reassuring.  Initial attempt at ambulation with drops in O2 sats.  However, patient does not appear in acute distress.  Repeat DuoNeb was ordered.  On recheck, patient states she feels much better.  She is now able to ambulate and keep pulse ox greater than 96%.  Suspect acute COPD exacerbation.  Will discharge with albuterol and doxycycline.  She was given Decadron in the ED.  After history, exam, and medical workup I feel the patient has been appropriately medically screened and is safe for discharge home. Pertinent diagnoses were discussed with the patient. Patient was given return precautions.   Final Clinical Impressions(s) / ED Diagnoses   Final diagnoses:  COPD exacerbation (HCC)    New  Prescriptions New Prescriptions   ALBUTEROL (PROVENTIL HFA;VENTOLIN HFA) 108 (90 BASE) MCG/ACT INHALER    Inhale 2 puffs into the lungs every 4 (four) hours as needed for wheezing or shortness of breath.   DOXYCYCLINE (VIBRAMYCIN) 100 MG CAPSULE    Take 1 capsule (100 mg total) by mouth 2 (two) times daily.     Shon Baton, MD 12/26/16 [phone]

## 2016-12-26 NOTE — ED Notes (Signed)
Pt ambulated up-down the hall and back to room 96%
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
## 2017-01-17 ENCOUNTER — Encounter (HOSPITAL_COMMUNITY): Payer: Self-pay | Admitting: Nurse Practitioner

## 2017-01-17 DIAGNOSIS — R0602 Shortness of breath: Secondary | ICD-10-CM | POA: Diagnosis present

## 2017-01-17 DIAGNOSIS — Z79899 Other long term (current) drug therapy: Secondary | ICD-10-CM | POA: Diagnosis not present

## 2017-01-17 DIAGNOSIS — F17210 Nicotine dependence, cigarettes, uncomplicated: Secondary | ICD-10-CM | POA: Diagnosis not present

## 2017-01-17 DIAGNOSIS — J449 Chronic obstructive pulmonary disease, unspecified: Secondary | ICD-10-CM | POA: Diagnosis not present

## 2017-01-17 NOTE — ED Triage Notes (Signed)
Pt states due to being homeless, smoking and walking too much in the cold, she is experiencing cough and cold symptoms. She received an albuteral breathing treatment from the medics who brought her in from the homeless shelter which she reports much improvement.

## 2017-01-18 ENCOUNTER — Emergency Department (HOSPITAL_COMMUNITY)
Admission: EM | Admit: 2017-01-18 | Discharge: 2017-01-18 | Disposition: A | Discharge status: Home / Self Care | Payer: Medicaid Other | Attending: Emergency Medicine | Admitting: Emergency Medicine

## 2017-01-18 DIAGNOSIS — J441 Chronic obstructive pulmonary disease with (acute) exacerbation: Secondary | ICD-10-CM

## 2017-01-18 MED ORDER — ALBUTEROL SULFATE HFA 108 (90 BASE) MCG/ACT IN AERS
2.0000 | INHALATION_SPRAY | RESPIRATORY_TRACT | Status: DC | PRN
  Administered 2017-01-18: 2 via RESPIRATORY_TRACT
  Filled 2017-01-18: qty 6.7

## 2017-01-18 MED ORDER — IPRATROPIUM-ALBUTEROL 0.5-2.5 (3) MG/3ML IN SOLN
3.0000 mL | Freq: Once | RESPIRATORY_TRACT | Status: AC
  Administered 2017-01-18: 3 mL via RESPIRATORY_TRACT
  Filled 2017-01-18: qty 3

## 2017-01-18 MED ORDER — DOXYCYCLINE HYCLATE 100 MG PO CAPS: 100.0000 mg | ORAL_CAPSULE | Freq: Two times a day (BID) | ORAL | 0 refills | Status: DC

## 2017-01-18 MED ORDER — PREDNISONE 20 MG PO TABS: 40.0000 mg | ORAL_TABLET | Freq: Every day | ORAL | 0 refills | Status: DC

## 2017-01-18 MED ORDER — PREDNISONE 20 MG PO TABS
60.0000 mg | ORAL_TABLET | Freq: Once | ORAL | Status: AC
  Administered 2017-01-18: 60 mg via ORAL
  Filled 2017-01-18: qty 3

## 2017-01-18 NOTE — ED Provider Notes (Signed)
[facility]-EMERGENCY DEPT Provider Note   CSN: [id number] Arrival date & time: 01/17/17  2304     History   Chief Complaint Chief Complaint  Patient presents with  . URI    HPI Melissa Dickerson is a 46 y.o. female.  Patient with history of COPD presents to the ER for evaluation of dry cough, shortness of breath and wheezing.  She comes to the ER by ambulance.  She was administered albuterol nebulizer during transport with improvement.  She reports that she is feeling better.  She has not had any fever.  No chest pain.      Past Medical History:  Diagnosis Date  . COPD (chronic obstructive pulmonary disease) (HCC)   . Schizoaffective disorder [facility](HCC)     Patient Active Problem List   Diagnosis Date Noted  . Tobacco abuse 12/10/2016  . GERD (gastroesophageal reflux disease) 12/10/2016  . HTN (hypertension) 12/05/2016  . COPD exacerbation (HCC) 12/04/2016  . Schizoaffective disorder, depressive type (HCC) 03/12/2016    Past Surgical History:  Procedure Laterality Date  . CHOLECYSTECTOMY      OB History    No data available       Home Medications    Prior to Admission medications   Medication Sig Start Date End Date Taking? Authorizing Provider  albuterol (PROVENTIL HFA;VENTOLIN HFA) 108 (90 Base) MCG/ACT inhaler Inhale 2 puffs into the lungs every 6 (six) hours as needed for wheezing or shortness of breath. 12/10/16   Jaclyn ShaggyAmao, Enobong, MD  albuterol (PROVENTIL HFA;VENTOLIN HFA) 108 (90 Base) MCG/ACT inhaler Inhale 2 puffs into the lungs every 4 (four) hours as needed for wheezing or shortness of breath. 12/26/16   Horton, Mayer Maskerourtney F, MD  ARIPiprazole (ABILIFY) 10 MG tablet Take 10 mg by mouth daily.    [provider]  benztropine (COGENTIN) 2 MG tablet Take 2 mg by mouth daily.    [provider]  budesonide-formoterol (SYMBICORT) 160-4.5 MCG/ACT inhaler Inhale 2 puffs into the lungs 2 (two) times daily. 12/10/16   Jaclyn ShaggyAmao,  Enobong, MD  divalproex (DEPAKOTE) 250 MG DR tablet Take 750 mg by mouth at bedtime. 11/24/16   [provider]  doxycycline (VIBRAMYCIN) 100 MG capsule Take 1 capsule (100 mg total) by mouth 2 (two) times daily. 01/18/17   Gilda CreasePollina, Christopher J, MD  escitalopram (LEXAPRO) 10 MG tablet Take 10 mg by mouth daily. 11/24/16   [provider]  fluticasone (FLONASE) 50 MCG/ACT nasal spray Place 2 sprays into both nostrils daily. 12/10/16   Jaclyn ShaggyAmao, Enobong, MD  lisinopril-hydrochlorothiazide (PRINZIDE,ZESTORETIC) 20-25 MG tablet Take 1 tablet by mouth daily. 12/10/16   Jaclyn ShaggyAmao, Enobong, MD  nicotine (NICODERM CQ - DOSED IN MG/24 HOURS) 14 mg/24hr patch Place 1 patch (14 mg total) onto the skin daily. 12/10/16   Jaclyn ShaggyAmao, Enobong, MD  pantoprazole (PROTONIX) 40 MG tablet Take 1 tablet (40 mg total) by mouth daily. 12/10/16   Jaclyn ShaggyAmao, Enobong, MD  predniSONE (DELTASONE) 20 MG tablet Take 2 tablets (40 mg total) by mouth daily with breakfast. 01/18/17   Pollina, Canary Brimhristopher J, MD  tiotropium (SPIRIVA HANDIHALER) 18 MCG inhalation capsule Place 1 capsule (18 mcg total) into inhaler and inhale daily. 12/10/16   Jaclyn ShaggyAmao, Enobong, MD    Family History History reviewed. No pertinent family history.  Social History Social History   Tobacco Use  . Smoking status: Current Every Day Smoker    Packs/day: 0.15    Types: Cigarettes  . Smokeless tobacco: Never Used  Substance Use Topics  .  Alcohol use: No  . Drug use: No     Allergies   Haldol [haloperidol]; Pork-derived products; and Prozac [fluoxetine hcl]   Review of Systems Review of Systems  Respiratory: Positive for cough, shortness of breath and wheezing.   All other systems reviewed and are negative.    Physical Exam Updated Vital Signs BP (!) 143/74 (BP Location: Left Arm)   Pulse 93   Temp (!) 97.5 F (36.4 C) (Oral)   Resp 18   SpO2 98%   Physical Exam  Constitutional: She is oriented to person, place, and time. She appears  well-developed and well-nourished. No distress.  HENT:  Head: Normocephalic and atraumatic.  Right Ear: Hearing normal.  Left Ear: Hearing normal.  Nose: Nose normal.  Mouth/Throat: Oropharynx is clear and moist and mucous membranes are normal.  Eyes: Conjunctivae and EOM are normal. Pupils are equal, round, and reactive to light.  Neck: Normal range of motion. Neck supple.  Cardiovascular: Regular rhythm, S1 normal and S2 normal. Exam reveals no gallop and no friction rub.  No murmur heard. Pulmonary/Chest: Effort normal. No respiratory distress. She has wheezes. She exhibits no tenderness.  Abdominal: Soft. Normal appearance and bowel sounds are normal. There is no hepatosplenomegaly. There is no tenderness. There is no rebound, no guarding, no tenderness at McBurney's point and negative Murphy's sign. No hernia.  Musculoskeletal: Normal range of motion.  Neurological: She is alert and oriented to person, place, and time. She has normal strength. No cranial nerve deficit or sensory deficit. Coordination normal. GCS eye subscore is 4. GCS verbal subscore is 5. GCS motor subscore is 6.  Skin: Skin is warm, dry and intact. No rash noted. No cyanosis.  Psychiatric: She has a normal mood and affect. Her speech is normal and behavior is normal. Thought content normal.  Nursing note and vitals reviewed.    ED Treatments / Results  Labs (all labs ordered are listed, but only abnormal results are displayed) Labs Reviewed - No data to display  EKG  EKG Interpretation None       Radiology No results found.  Procedures Procedures (including critical care time)  Medications Ordered in ED Medications  ipratropium-albuterol (DUONEB) 0.5-2.5 (3) MG/3ML nebulizer solution 3 mL (not administered)  predniSONE (DELTASONE) tablet 60 mg (not administered)  albuterol (PROVENTIL HFA;VENTOLIN HFA) 108 (90 Base) MCG/ACT inhaler 2 puff (not administered)     Initial Impression / Assessment and  Plan / ED Course  I have reviewed the triage vital signs and the nursing notes.  Pertinent labs & imaging results that were available during my care of the patient were reviewed by me and considered in my medical decision making (see chart for details).     Patient with history of COPD presents to the ER for evaluation of cough, shortness of breath and wheezing.  She improved with nebulizer treatment during transport, still has some slight wheezing.  She does have good air movement, normal oxygenation.  No clinical concern for pneumonia.  We will treat for COPD exacerbation.  Continue bronchodilator therapy, add prednisone and doxy.  Final Clinical Impressions(s) / ED Diagnoses   Final diagnoses:  COPD exacerbation [facility](HCC)    ED Discharge Orders        Ordered    predniSONE (DELTASONE) 20 MG tablet  Daily with breakfast     01/18/17 0141    doxycycline (VIBRAMYCIN) 100 MG capsule  2 times daily     01/18/17 0141  Gilda Crease, MD 01/18/17 [phone]

## 2017-01-18 NOTE — ED Notes (Signed)
Bed: [facility]3 Expected date:  Expected time:  Means of arrival:  Comments: 

## 2017-03-02 ENCOUNTER — Emergency Department (HOSPITAL_COMMUNITY): Payer: Medicaid Other

## 2017-03-02 ENCOUNTER — Emergency Department (HOSPITAL_COMMUNITY)
Admission: EM | Admit: 2017-03-02 | Discharge: 2017-03-02 | Disposition: A | Discharge status: Home / Self Care | Payer: Medicaid Other | Attending: Emergency Medicine | Admitting: Emergency Medicine

## 2017-03-02 ENCOUNTER — Other Ambulatory Visit: Payer: Self-pay

## 2017-03-02 DIAGNOSIS — I10 Essential (primary) hypertension: Secondary | ICD-10-CM | POA: Diagnosis not present

## 2017-03-02 DIAGNOSIS — J441 Chronic obstructive pulmonary disease with (acute) exacerbation: Secondary | ICD-10-CM

## 2017-03-02 DIAGNOSIS — Z79899 Other long term (current) drug therapy: Secondary | ICD-10-CM | POA: Insufficient documentation

## 2017-03-02 DIAGNOSIS — F1410 Cocaine abuse, uncomplicated: Secondary | ICD-10-CM | POA: Insufficient documentation

## 2017-03-02 DIAGNOSIS — R0602 Shortness of breath: Secondary | ICD-10-CM | POA: Diagnosis present

## 2017-03-02 DIAGNOSIS — F17210 Nicotine dependence, cigarettes, uncomplicated: Secondary | ICD-10-CM | POA: Diagnosis not present

## 2017-03-02 LAB — COMPREHENSIVE METABOLIC PANEL
ALBUMIN: 3.8 g/dL (ref 3.5–5.0)
ALK PHOS: 59 U/L (ref 38–126)
ALT: 24 U/L (ref 14–54)
ANION GAP: 6 (ref 5–15)
AST: 24 U/L (ref 15–41)
BUN: 13 mg/dL (ref 6–20)
CALCIUM: 8.7 mg/dL — AB (ref 8.9–10.3)
CO2: 29 mmol/L (ref 22–32)
Chloride: 105 mmol/L (ref 101–111)
Creatinine, Ser: 0.89 mg/dL (ref 0.44–1.00)
GFR calc Af Amer: >60 mL/min (ref >60)
GFR calc non Af Amer: >60 mL/min (ref >60)
GLUCOSE: 110 mg/dL — AB (ref 65–99)
Potassium: 4.3 mmol/L (ref 3.5–5.1)
SODIUM: 140 mmol/L (ref 135–145)
Total Bilirubin: 0.6 mg/dL (ref 0.3–1.2)
Total Protein: 7.1 g/dL (ref 6.5–8.1)

## 2017-03-02 LAB — RAPID URINE DRUG SCREEN, HOSP PERFORMED
Amphetamines: NOT DETECTED
Barbiturates: NOT DETECTED
Benzodiazepines: NOT DETECTED
Cocaine: POSITIVE — AB
OPIATES: NOT DETECTED
TETRAHYDROCANNABINOL: NOT DETECTED

## 2017-03-02 LAB — CBC WITH DIFFERENTIAL/PLATELET
Basophils Absolute: 0 10*3/uL (ref 0.0–0.1)
Basophils Relative: 0 %
EOS ABS: 0.1 10*3/uL (ref 0.0–0.7)
Eosinophils Relative: 1 %
HCT: 39.7 % (ref 36.0–46.0)
HEMOGLOBIN: 12.8 g/dL (ref 12.0–15.0)
Lymphocytes Relative: 44 %
Lymphs Abs: 3.5 10*3/uL (ref 0.7–4.0)
MCH: 28.8 pg (ref 26.0–34.0)
MCHC: 32.2 g/dL (ref 30.0–36.0)
MCV: 89.4 fL (ref 78.0–100.0)
Monocytes Absolute: 0.4 10*3/uL (ref 0.1–1.0)
Monocytes Relative: 5 %
NEUTROS PCT: 50 %
Neutro Abs: 3.9 10*3/uL (ref 1.7–7.7)
Platelets: 216 10*3/uL (ref 150–400)
RBC: 4.44 MIL/uL (ref 3.87–5.11)
RDW: 15 % (ref 11.5–15.5)
WBC: 7.9 10*3/uL (ref 4.0–10.5)

## 2017-03-02 LAB — ETHANOL: Alcohol, Ethyl (B): <10 mg/dL (ref <10)

## 2017-03-02 LAB — I-STAT TROPONIN, ED: Troponin i, poc: 0 ng/mL (ref 0.00–0.08)

## 2017-03-02 MED ORDER — PREDNISONE 10 MG PO TABS: ORAL_TABLET | ORAL | 0 refills | Status: DC

## 2017-03-02 MED ORDER — IPRATROPIUM BROMIDE 0.02 % IN SOLN
0.5000 mg | Freq: Once | RESPIRATORY_TRACT | Status: AC
  Administered 2017-03-02: 0.5 mg via RESPIRATORY_TRACT
  Filled 2017-03-02: qty 2.5

## 2017-03-02 MED ORDER — ALBUTEROL SULFATE (2.5 MG/3ML) 0.083% IN NEBU
5.0000 mg | INHALATION_SOLUTION | Freq: Once | RESPIRATORY_TRACT | Status: AC
  Administered 2017-03-02: 5 mg via RESPIRATORY_TRACT
  Filled 2017-03-02: qty 6

## 2017-03-02 MED ORDER — SODIUM CHLORIDE 0.9 % IV BOLUS (SEPSIS)
1000.0000 mL | Freq: Once | INTRAVENOUS | Status: AC
  Administered 2017-03-02: 1000 mL via INTRAVENOUS

## 2017-03-02 MED ORDER — ALBUTEROL SULFATE HFA 108 (90 BASE) MCG/ACT IN AERS
1.0000 | INHALATION_SPRAY | RESPIRATORY_TRACT | Status: DC | PRN
  Filled 2017-03-02: qty 6.7

## 2017-03-02 NOTE — Discharge Instructions (Signed)
Take prednisone as prescribed.   [place] albuterol every 4 hrs as needed for cough or wheezing.   See list of shelters.   See list of outpatient resources for detox. [facility] or [facility]   See primary care doctor   Return to ER if you have worse trouble breathing, shortness of breath, chest pain.

## 2017-03-02 NOTE — ED Triage Notes (Signed)
Per EMS, Pt is homeless, diff breathing. Hx asthma and COPD. No inhaler. Wheezing. Albuterol 10 given and 0.5 atrovent. Solumedrol 125 IV. Pt wants help with rehab with cocaine abuse.

## 2017-03-02 NOTE — ED Provider Notes (Signed)
[facility]-EMERGENCY DEPT Provider Note   CSN: [id number] Arrival date & time: 03/02/17  1708     History   Chief Complaint Chief Complaint  Patient presents with  . Shortness of Breath    HPI Melissa Dickerson is a 47 y.o. female hx of COPD, schizoaffective, here presenting with cough, shortness of breath.  Patient states that she is homeless and has been in the rain last several days.  For the last several days, she has been coughing and has some subjective shortness of breath.  She states that she still smokes cigarettes and this is similar to her previous COPD exacerbation.  Patient also admits to cocaine abuse and wants help with rehab.  Patient was noted to have diffuse wheezing per EMS was given albuterol 10 mg and Atrovent 0.5 mg as well as Solu-Medrol 125 mg IV.  Patient has a history of COPD.  The history is provided by the patient.    Past Medical History:  Diagnosis Date  . COPD (chronic obstructive pulmonary disease) (HCC)   . Schizoaffective disorder [facility])     Patient Active Problem List   Diagnosis Date Noted  . Tobacco abuse 12/10/2016  . GERD (gastroesophageal reflux disease) 12/10/2016  . HTN (hypertension) 12/05/2016  . COPD exacerbation (HCC) 12/04/2016  . Schizoaffective disorder, depressive type (HCC) 03/12/2016    Past Surgical History:  Procedure Laterality Date  . CHOLECYSTECTOMY      OB History    No data available       Home Medications    Prior to Admission medications   Medication Sig Start Date End Date Taking? Authorizing Provider  albuterol (PROVENTIL HFA;VENTOLIN HFA) 108 (90 Base) MCG/ACT inhaler Inhale 2 puffs into the lungs every 6 (six) hours as needed for wheezing or shortness of breath. 12/10/16   Jaclyn Shaggy, MD  albuterol (PROVENTIL HFA;VENTOLIN HFA) 108 (90 Base) MCG/ACT inhaler Inhale 2 puffs into the lungs every 4 (four) hours as needed for wheezing or shortness of breath. 12/26/16   Horton, Mayer Masker,  MD  ARIPiprazole (ABILIFY) 10 MG tablet Take 10 mg by mouth daily.    [provider]  benztropine (COGENTIN) 2 MG tablet Take 2 mg by mouth daily.    [provider]  budesonide-formoterol (SYMBICORT) 160-4.5 MCG/ACT inhaler Inhale 2 puffs into the lungs 2 (two) times daily. 12/10/16   Jaclyn Shaggy, MD  divalproex (DEPAKOTE) 250 MG DR tablet Take 750 mg by mouth at bedtime. 11/24/16   [provider]  doxycycline (VIBRAMYCIN) 100 MG capsule Take 1 capsule (100 mg total) by mouth 2 (two) times daily. 01/18/17   Gilda Crease, MD  escitalopram (LEXAPRO) 10 MG tablet Take 10 mg by mouth daily. 11/24/16   [provider]  fluticasone (FLONASE) 50 MCG/ACT nasal spray Place 2 sprays into both nostrils daily. 12/10/16   Jaclyn Shaggy, MD  lisinopril-hydrochlorothiazide (PRINZIDE,ZESTORETIC) 20-25 MG tablet Take 1 tablet by mouth daily. 12/10/16   Jaclyn Shaggy, MD  nicotine (NICODERM CQ - DOSED IN MG/24 HOURS) 14 mg/24hr patch Place 1 patch (14 mg total) onto the skin daily. 12/10/16   Jaclyn Shaggy, MD  pantoprazole (PROTONIX) 40 MG tablet Take 1 tablet (40 mg total) by mouth daily. 12/10/16   Jaclyn Shaggy, MD  predniSONE (DELTASONE) 20 MG tablet Take 2 tablets (40 mg total) by mouth daily with breakfast. 01/18/17   Pollina, Canary Brim, MD  tiotropium (SPIRIVA HANDIHALER) 18 MCG inhalation capsule Place 1 capsule (18 mcg total) into inhaler  and inhale daily. 12/10/16   Jaclyn Shaggy, MD    Family History No family history on file.  Social History Social History   Tobacco Use  . Smoking status: Current Every Day Smoker    Packs/day: 0.15    Types: Cigarettes  . Smokeless tobacco: Never Used  Substance Use Topics  . Alcohol use: No  . Drug use: No     Allergies   Haldol [haloperidol]; Pork-derived products; and Prozac [fluoxetine hcl]   Review of Systems Review of Systems  Respiratory: Positive for shortness of breath.   All other  systems reviewed and are negative.    Physical Exam Updated Vital Signs BP 125/73   Pulse 77   Temp 99 F (37.2 C) (Oral)   Resp 13   Ht 5\' 5"  (1.651 m)   Wt (!) 175.1 kg (386 lb)   LMP 02/16/2017   SpO2 100%   BMI 64.23 kg/m   Physical Exam  Constitutional: She appears well-developed.  Disheveled, coughing   HENT:  Head: Normocephalic.  Mouth/Throat: Oropharynx is clear and moist.  Eyes: EOM are normal. Pupils are equal, round, and reactive to light.  Neck: Normal range of motion.  Cardiovascular: Normal rate and normal heart sounds.  Pulmonary/Chest:  Slightly tachypneic, mild diffuse wheezing, no retractions   Abdominal: Soft. Bowel sounds are normal.  Musculoskeletal: Normal range of motion.       Right lower leg: Normal.       Left lower leg: Normal.  Neurological: She is alert.  Skin: Skin is warm.  Nursing note and vitals reviewed.    ED Treatments / Results  Labs (all labs ordered are listed, but only abnormal results are displayed) Labs Reviewed  COMPREHENSIVE METABOLIC PANEL - Abnormal; Notable for the following components:      Result Value   Glucose, Bld 110 (*)    Calcium 8.7 (*)    All other components within normal limits  CBC WITH DIFFERENTIAL/PLATELET  ETHANOL  RAPID URINE DRUG SCREEN, HOSP PERFORMED  I-STAT TROPONIN, ED    EKG  EKG Interpretation  Date/Time:  Saturday March 02 2017 17:23:06 EST Ventricular Rate:  79 PR Interval:    QRS Duration: 89 QT Interval:  398 QTC Calculation: 457 R Axis:   6 Text Interpretation:  Sinus rhythm No significant change since last tracing Confirmed by Richardean Canal ([id number]) on 03/02/2017 5:29:50 PM       Radiology Dg Chest Port 1 View  Result Date: 03/02/2017 CLINICAL DATA:  Shortness of breath and cough. Difficulty breathing. Wheezing. History of asthma and COPD. Smoker. EXAM: PORTABLE CHEST 1 VIEW COMPARISON:  12/25/2016 FINDINGS: Shallow inspiration. Normal heart size and pulmonary  vascularity. No focal airspace disease or consolidation in the lungs. No blunting of costophrenic angles. No pneumothorax. Mediastinal contours appear intact. IMPRESSION: No active disease. Electronically Signed   By: Burman Nieves M.D.   On: 03/02/2017 18:23    Procedures Procedures (including critical care time)  Medications Ordered in ED Medications  albuterol (PROVENTIL) (2.5 MG/3ML) 0.083% nebulizer solution 5 mg (not administered)  ipratropium (ATROVENT) nebulizer solution 0.5 mg (not administered)  sodium chloride 0.9 % bolus 1,000 mL (1,000 mLs Intravenous New Bag/Given 03/02/17 1735)     Initial Impression / Assessment and Plan / ED Course  I have reviewed the triage vital signs and the nursing notes.  Pertinent labs & imaging results that were available during my care of the patient were reviewed by me and considered in my medical  decision making (see chart for details).     Melissa Dickerson is a 47 y.o. female here with cough. Likely COPD exacerbation. Given nebs, steroids by EMS. Will get labs, EKG, UDS, CXR. Will give another neb and reassess. Will consult case management regarding shelter placement, drug rehab.   7:20 PM Labs and CXR normal. No case manager on currently. Felt better with nebs and steroids. Minimal wheezing now. Not hypoxic. She lives in the street so will give list of shelters and rehab facilities for cocaine use.    Final Clinical Impressions(s) / ED Diagnoses   Final diagnoses:  None    ED Discharge Orders    None       Charlynne PanderYao, Dotsie Gillette Hsienta, MD 03/02/17 Ernestina [id number]

## 2017-03-02 NOTE — ED Notes (Signed)
Bed: ZO10WA22 Expected date:  Expected time:  Means of arrival:  Comments: Tr. 4

## 2017-03-05 ENCOUNTER — Other Ambulatory Visit: Payer: Self-pay | Admitting: Family Medicine

## 2017-12-25 ENCOUNTER — Emergency Department (HOSPITAL_COMMUNITY)
Admission: EM | Admit: 2017-12-25 | Discharge: 2017-12-25 | Disposition: A | Discharge status: Home / Self Care | Payer: Medicaid Other | Attending: Emergency Medicine | Admitting: Emergency Medicine

## 2017-12-25 DIAGNOSIS — F17210 Nicotine dependence, cigarettes, uncomplicated: Secondary | ICD-10-CM | POA: Insufficient documentation

## 2017-12-25 DIAGNOSIS — Y939 Activity, unspecified: Secondary | ICD-10-CM | POA: Diagnosis not present

## 2017-12-25 DIAGNOSIS — J449 Chronic obstructive pulmonary disease, unspecified: Secondary | ICD-10-CM | POA: Diagnosis not present

## 2017-12-25 DIAGNOSIS — Y929 Unspecified place or not applicable: Secondary | ICD-10-CM | POA: Insufficient documentation

## 2017-12-25 DIAGNOSIS — L299 Pruritus, unspecified: Secondary | ICD-10-CM | POA: Insufficient documentation

## 2017-12-25 DIAGNOSIS — S40861A Insect bite (nonvenomous) of right upper arm, initial encounter: Secondary | ICD-10-CM | POA: Insufficient documentation

## 2017-12-25 DIAGNOSIS — Y999 Unspecified external cause status: Secondary | ICD-10-CM | POA: Diagnosis not present

## 2017-12-25 DIAGNOSIS — W57XXXA Bitten or stung by nonvenomous insect and other nonvenomous arthropods, initial encounter: Secondary | ICD-10-CM | POA: Insufficient documentation

## 2017-12-25 DIAGNOSIS — I10 Essential (primary) hypertension: Secondary | ICD-10-CM | POA: Insufficient documentation

## 2017-12-25 NOTE — ED Provider Notes (Signed)
Emergency Department Provider Note   I have reviewed the triage vital signs and the nursing notes.   HISTORY  Chief Complaint Insect Bite   HPI Melissa Dickerson is a 47 y.o. female with PMH of COPD and schizoaffective who is currently homeless presents to the emergency department with itchy bumps over her arms and under the right breast.  The patient states she is currently sleeping underneath a bridge and has seen many spiders in the area.  She noticed itching and burning to these areas on her arm today and called EMS for transport.  She denies any IV drug abuse.  Denies fevers or chills.  She states she is looking for a shelter she can sleep but has not found one as of yet.  No associated symptoms.  No other modifying factors. Patient was given benadryl en route with EMS.   Past Medical History:  Diagnosis Date  . COPD (chronic obstructive pulmonary disease) (HCC)   . Schizoaffective disorder [facility])     Patient Active Problem List   Diagnosis Date Noted  . Tobacco abuse 12/10/2016  . GERD (gastroesophageal reflux disease) 12/10/2016  . HTN (hypertension) 12/05/2016  . COPD exacerbation (HCC) 12/04/2016  . Schizoaffective disorder, depressive type (HCC) 03/12/2016    Past Surgical History:  Procedure Laterality Date  . CHOLECYSTECTOMY     Allergies Haldol [haloperidol]; Pork-derived products; and Prozac [fluoxetine hcl]  No family history on file.  Social History Social History   Tobacco Use  . Smoking status: Current Every Day Smoker    Packs/day: 0.15    Types: Cigarettes  . Smokeless tobacco: Never Used  Substance Use Topics  . Alcohol use: No  . Drug use: No    Review of Systems  Constitutional: No fever/chills Eyes: No visual changes. ENT: No sore throat. Cardiovascular: Denies chest pain. Respiratory: Denies shortness of breath. Gastrointestinal: No abdominal pain.  No nausea, no vomiting.  No diarrhea.  No constipation. Genitourinary: Negative for  dysuria. Musculoskeletal: Negative for back pain. Skin: Rash on arm.  Neurological: Negative for headaches, focal weakness or numbness.  10-point ROS otherwise negative.  ____________________________________________   PHYSICAL EXAM:  VITAL SIGNS: ED Triage Vitals [12/25/17 0836]  Enc Vitals Group     BP (!) 149/73     Pulse Rate 81     Resp 18     Temp 98.3 F (36.8 C)     Temp Source Oral     SpO2 97 %   Constitutional: Alert and oriented. Well appearing and in no acute distress. Eyes: Conjunctivae are normal. Head: Atraumatic. Nose: No congestion/rhinnorhea. Mouth/Throat: Mucous membranes are moist.  Neck: No stridor.   Cardiovascular: Normal rate, regular rhythm. Good peripheral circulation. Grossly normal heart sounds.   Respiratory: Normal respiratory effort.  No retractions. Lungs CTAB. Gastrointestinal: Soft and nontender. No distention.  Musculoskeletal: No lower extremity tenderness nor edema. No gross deformities of extremities. Neurologic:  Normal speech and language. No gross focal neurologic deficits are appreciated.  Skin:  Two circular lesions over the right arm. Erythematous with punctate area in the central sound. No induration or fluctuance. No streaking. Smaller area noted over the right lateral breast.      ____________________________________________   PROCEDURES  Procedure(s) performed:   Procedures  None  ____________________________________________   INITIAL IMPRESSION / ASSESSMENT AND PLAN / ED COURSE  Pertinent labs & imaging results that were available during my care of the patient were reviewed by me and considered in my medical decision making (  see chart for details).  Patient presents to the emergency department for evaluation of likely insect bite to the right arm.  No evidence to suggest acute infectious process.  No evidence of abscess.  Patient is describing itching at the site.  She received Benadryl in route.  Patient is  homeless and sleeping in an area with many spiders.  No muscle cramping or other signs/symptoms to suspect a more serious envenomation.  Patient is not experiencing any signs or symptoms to suggest anaphylaxis.  Plan for conservative management and will provide resources to area shelters.    ____________________________________________  FINAL CLINICAL IMPRESSION(S) / ED DIAGNOSES  Final diagnoses:  Insect bite of right upper arm, initial encounter    Note:  This document was prepared using Dragon voice recognition software and may include unintentional dictation errors.  Alona Bene, MD Emergency Medicine    Lamonica Trueba, Arlyss Repress, MD 12/25/17 [phone]

## 2017-12-25 NOTE — ED Notes (Signed)
Bed: WA06 Expected date:  Expected time:  Means of arrival:  Comments: 47 y/o -bug bites

## 2017-12-25 NOTE — ED Triage Notes (Signed)
Transported by [employer] --pt is homeless, woke up [place] a bridge and noticed diffused bites to right forearm and under right breast. EMS administered 50 mg of PO Benadryl. +wheezing, area is hot to touch.

## 2017-12-25 NOTE — Discharge Instructions (Signed)
Substance Abuse Treatment Programs ° °Intensive Outpatient Programs °[facility]     °[address]      °[place], [place]                   °[phone]      ° °[facility] °[address] °[place], [place] °[phone] ° °[facility]     °(Inpatient and outpatient)     °[address]           °[phone]   ° °[facility] °[phone] (Suboxone and Methadone) ° °[address]      °[place], [place] [address]      °[phone]      ° °[address] °[place], [place] °852-3033 ° °[facility] (Outpatient/Inpatient, [facility])    °(insurance only) [phone]      °       °[facility] (Groups & Residential) °[place], [place] °[phone] ° °   °[facility]     °[address]     °[place], [place]      °[phone]      ° °[facility] (for caregivers and family) °[address] °[place], [place] °336-299-4655 ° ° ° ° ° °Residential Treatment Programs °[facility]      °[address], [place], [place] [address]  °([phone]      ° °[facility] °[address] [place], [place] [address] °[phone] ° °[facility]        °336-248-8914      ° °[facility] °[phone] ° °[facility]             °[address]                                         °[place], [place]                                                °[phone] or [phone]                              ° °[facility] [place] °[address] °[place], [address] °[phone] ° °[facility]    °[address]      °[place], [place]     °[phone]      ° °[facility] °[address] °, [place] °[phone] ° °[facility]   °[address]     °[place], [place] [address]     °[phone]      °Admissions: 8am-3pm M-F ° °[facility] °[address] °[place],  [place] °336-227-7417 ° °[facility]: Residential Program (90 Days)   °[place], [place]      °336-725-8389 or 800-758-6077    ° °[facility] °[place], [place] °(Walk in Hours over the weekend or by referral) ° °[facility] [facility] °[address], [place], [place] [address] °([phone] ° °Crisis Mobile: [facility]:  1-877-626-1772 (for crisis response 24 hours a day) °[facility] Hotline:      1-800-256-2452 °Outpatient Psychiatry and Counseling ° °[facility]: Mobile Crisis   Management 24 hours:  1-877-626-1772 ° °[facility] sliding scale fee and walk in schedule: M-F 8am-12pm/1pm-3pm °[address]  °[place], [place] [address] °[phone] ° °[facility] °[address] °[place], [place] [address] °[phone] ° °[facility] ([facility] known [facility])- new patient walk-in appointments available Monday - Friday 8am -3pm.          °[address] °[place], [place] [address] °336-676-6840 or crisis line- 336-676-6905 ° °[facility]/ Intensive Outpatient Therapy Program °[address] °[place], [place] [address] °336-832-9804 ° °[facility]                  °Crisis Services      °[phone]      °[address]     °[place], [place] [address]                ° °[facility]   °[facility] °[phone] °[address] °[place], [place] [address] ° ° °[facility]          °[address]  °[place], [place] [address]       °([phone] ° °[facility] °[address] °[place], [place] [address] °336-292-1510 ° °[facility]    °[address]    °[place], [place] [address]     °[phone]      ° °Parish McKinney, MD     °[address]     °[place] [address]     °336-282-1251     °  °[facility] °[address] °[place] [address] ° °[facility]     °[address]     °[place], [place]      °336-542-2076      ° °[facility] °Shamsher Ahluwalia, MD °[address] °[place], [place] [address] °[phone] ° °[facility]     °[address]     °[place], [place] [address]     °336-274-1237      ° °[facility] °[address] °[place], [place] [address] °336-854-4450 °Resources for Temporary Residential Assistance/Crisis Centers ° °DAY CENTERS °[facility] °M-F 8am-3pm   °[address] [place], [place] [address]   336-332-0824 °Services include: laundry, barbering, support groups, case management, phone  & computer access, showers, AA/NA mtgs, mental health/substance abuse nurse, job skills class, disability information, VA assistance, spiritual classes, etc.  ° °HOMELESS SHELTERS ° °[facility]     °[facility]   °[address], [place]     °[phone]       °       °[facility] (women and children)       °[address] °[place], [place] [address] °336-275-0820 °[email or web address] for application and process °Application Required ° °[facility]   °[address]    °[place] [address]     °[phone]       °             °[facility] °[address] °[place],  [address] °336.273.5572 °336-235-0363(schedule application appt.) °Application Required ° °[facility] (women only)    °[address]     °[place],  [address]     °336-884-1039      °  Intake starts 6pm daily °Need valid ID, SSC, & Police report °[facility] [place] °[address] °[place], [place] °336-881-5420 °Application Required ° °[facility] (men only)     °[address]      °[place], [place]     °[phone]      ° °[facility] °(Pregnant women only) °[address] °[place], [place] °336-275-0206 ° °[facility]      °[address]      °[place], [place] [address]     °[phone]      °       °[facility] [place] [facility] °[address] °[place], [place] °336-723-1848 °90 day commitment/SA/Application process ° °[facility](men only)     °[address]     °[place], [place]     °[phone]       °Check-in at 7pm     °       °[facility] °[address] °[place], [place] [address] °336-248-6684 °Men/Women/Women and Children must be there by 7 pm ° °[facility] °[place], [place] °336-722-8721                ° °

## 2018-01-05 ENCOUNTER — Other Ambulatory Visit: Payer: Self-pay

## 2018-01-05 ENCOUNTER — Emergency Department (HOSPITAL_COMMUNITY): Payer: Medicaid Other

## 2018-01-05 ENCOUNTER — Emergency Department (HOSPITAL_COMMUNITY)
Admission: EM | Admit: 2018-01-05 | Discharge: 2018-01-05 | Disposition: A | Discharge status: Home / Self Care | Payer: Medicaid Other | Attending: Emergency Medicine | Admitting: Emergency Medicine

## 2018-01-05 ENCOUNTER — Encounter (HOSPITAL_COMMUNITY): Payer: Self-pay

## 2018-01-05 DIAGNOSIS — J441 Chronic obstructive pulmonary disease with (acute) exacerbation: Secondary | ICD-10-CM | POA: Diagnosis not present

## 2018-01-05 DIAGNOSIS — Z79899 Other long term (current) drug therapy: Secondary | ICD-10-CM | POA: Insufficient documentation

## 2018-01-05 DIAGNOSIS — I10 Essential (primary) hypertension: Secondary | ICD-10-CM | POA: Insufficient documentation

## 2018-01-05 DIAGNOSIS — F17210 Nicotine dependence, cigarettes, uncomplicated: Secondary | ICD-10-CM | POA: Diagnosis not present

## 2018-01-05 DIAGNOSIS — R062 Wheezing: Secondary | ICD-10-CM | POA: Diagnosis present

## 2018-01-05 LAB — CBC WITH DIFFERENTIAL/PLATELET
ABS IMMATURE GRANULOCYTES: 0.03 10*3/uL (ref 0.00–0.07)
BASOS ABS: 0 10*3/uL (ref 0.0–0.1)
BASOS PCT: 0 %
EOS ABS: 0.2 10*3/uL (ref 0.0–0.5)
Eosinophils Relative: 3 %
HCT: 41.3 % (ref 36.0–46.0)
Hemoglobin: 12.8 g/dL (ref 12.0–15.0)
IMMATURE GRANULOCYTES: 0 %
Lymphocytes Relative: 42 %
Lymphs Abs: 3.1 10*3/uL (ref 0.7–4.0)
MCH: 28.8 pg (ref 26.0–34.0)
MCHC: 31 g/dL (ref 30.0–36.0)
MCV: 93 fL (ref 80.0–100.0)
MONOS PCT: 5 %
Monocytes Absolute: 0.4 10*3/uL (ref 0.1–1.0)
NEUTROS ABS: 3.6 10*3/uL (ref 1.7–7.7)
NEUTROS PCT: 50 %
NRBC: 0 % (ref 0.0–0.2)
PLATELETS: 173 10*3/uL (ref 150–400)
RBC: 4.44 MIL/uL (ref 3.87–5.11)
RDW: 13.2 % (ref 11.5–15.5)
WBC: 7.3 10*3/uL (ref 4.0–10.5)

## 2018-01-05 LAB — BASIC METABOLIC PANEL
ANION GAP: 6 (ref 5–15)
BUN: 9 mg/dL (ref 6–20)
CALCIUM: 8.4 mg/dL — AB (ref 8.9–10.3)
CO2: 27 mmol/L (ref 22–32)
Chloride: 108 mmol/L (ref 98–111)
Creatinine, Ser: 0.75 mg/dL (ref 0.44–1.00)
GFR calc Af Amer: >60 mL/min (ref >60)
GLUCOSE: 124 mg/dL — AB (ref 70–99)
POTASSIUM: 3.8 mmol/L (ref 3.5–5.1)
SODIUM: 141 mmol/L (ref 135–145)

## 2018-01-05 MED ORDER — ALBUTEROL SULFATE (2.5 MG/3ML) 0.083% IN NEBU
2.5000 mg | INHALATION_SOLUTION | Freq: Once | RESPIRATORY_TRACT | Status: AC
  Administered 2018-01-05: 2.5 mg via RESPIRATORY_TRACT
  Filled 2018-01-05: qty 3

## 2018-01-05 MED ORDER — ALBUTEROL SULFATE (2.5 MG/3ML) 0.083% IN NEBU
5.0000 mg | INHALATION_SOLUTION | Freq: Once | RESPIRATORY_TRACT | Status: AC
  Administered 2018-01-05: 5 mg via RESPIRATORY_TRACT
  Filled 2018-01-05: qty 6

## 2018-01-05 MED ORDER — PREDNISONE 50 MG PO TABS: 50.0000 mg | ORAL_TABLET | Freq: Every day | ORAL | 0 refills | Status: AC

## 2018-01-05 MED ORDER — ALBUTEROL SULFATE HFA 108 (90 BASE) MCG/ACT IN AERS
2.0000 | INHALATION_SPRAY | Freq: Once | RESPIRATORY_TRACT | Status: AC
  Administered 2018-01-05: 2 via RESPIRATORY_TRACT
  Filled 2018-01-05: qty 6.7

## 2018-01-05 MED ORDER — AEROCHAMBER Z-STAT PLUS/MEDIUM MISC
1.0000 | Freq: Once | Status: AC
  Administered 2018-01-05: 1
  Filled 2018-01-05: qty 1

## 2018-01-05 MED ORDER — IPRATROPIUM-ALBUTEROL 0.5-2.5 (3) MG/3ML IN SOLN
3.0000 mL | Freq: Once | RESPIRATORY_TRACT | Status: AC
  Administered 2018-01-05: 3 mL via RESPIRATORY_TRACT
  Filled 2018-01-05: qty 3

## 2018-01-05 NOTE — ED Notes (Addendum)
Pt ambulated in hallway with no assist; tolerated well. Oxygen saturations ranged from 94-100% while ambulating. 100% at rest before and after walk.

## 2018-01-05 NOTE — Discharge Instructions (Signed)
Your symptoms are due to a COPD exacerbation, please take prednisone for the next 5 days as directed, use your albuterol inhaler every 4-6 hours for the next day and then as needed for shortness of breath and wheezing.  Please follow-up with your primary care doctor.  Return to the emergency department if you have worsening shortness of breath, cough, fevers, chest pain or any other new or concerning symptoms.

## 2018-01-05 NOTE — ED Notes (Signed)
Bed: ZO10 Expected date:  Expected time:  Means of arrival:  Comments: 47 yo SOB, nebs/solumedrol given- hallucinations

## 2018-01-05 NOTE — ED Triage Notes (Signed)
She c/o shortness of breath x 2 days. She told EMS she has been unable to obtain her albuterol for past few days. She rec'd. Duoneb tx and IV solu medrol en route to hospital. She arrives in no distress. She tells [place] she has been homeless "for a long time until recently, I just got my own place". [sic]

## 2018-01-05 NOTE — ED Provider Notes (Signed)
[facility]-EMERGENCY DEPT Provider Note   CSN: [id number] Arrival date & time: 01/05/18  1650     History   Chief Complaint Chief Complaint  Patient presents with  . Wheezing    HPI Melissa Dickerson is a 47 y.o. female.  Melissa Dickerson is a 47 y.o. female with a history of hypertension, COPD, GERD and schizoaffective disorder, who presents to the emergency department for evaluation of shortness of breath, wheezing and cough.  Symptoms have been present and worsening over the past 3 days.  Patient reports she was unable to locate her albuterol inhaler, and did not try anything else to help with her symptoms.  She reports that her shortness of breath got significantly worse tonight while she was up trying to walk around her house and so she called EMS for further evaluation.  She denies any associated chest pain.  She has not had any cough or fevers.  Reports it feels like she cannot get enough air in.  This feels similar to her prior COPD exacerbations.  No associated rhinorrhea, sore throat.  No abdominal pain, nausea or vomiting.  Patient received 10 mg of albuterol, 0.5 of Atrovent and 125 of Solu-Medrol in route with mild improvement in symptoms.  No oxygen requirement at baseline.  Patient was previously homeless but has just obtained a permanent residence to live at, and reports she is trying to get back on track with all of her medications through [facility].      Past Medical History:  Diagnosis Date  . COPD (chronic obstructive pulmonary disease) (HCC)   . Schizoaffective disorder [facility])     Patient Active Problem List   Diagnosis Date Noted  . Tobacco abuse 12/10/2016  . GERD (gastroesophageal reflux disease) 12/10/2016  . HTN (hypertension) 12/05/2016  . COPD exacerbation (HCC) 12/04/2016  . Schizoaffective disorder, depressive type (HCC) 03/12/2016    Past Surgical History:  Procedure Laterality Date  . CHOLECYSTECTOMY       OB History   None        Home Medications    Prior to Admission medications   Medication Sig Start Date End Date Taking? Authorizing Provider  albuterol (PROVENTIL HFA;VENTOLIN HFA) 108 (90 Base) MCG/ACT inhaler Inhale 2 puffs into the lungs every 6 (six) hours as needed for wheezing or shortness of breath. Patient not taking: Reported on 12/25/2017 12/10/16   Hoy Register, MD  albuterol (PROVENTIL HFA;VENTOLIN HFA) 108 (90 Base) MCG/ACT inhaler Inhale 2 puffs into the lungs every 4 (four) hours as needed for wheezing or shortness of breath. 12/26/16   Horton, Mayer Masker, MD  ARIPiprazole (ABILIFY) 10 MG tablet Take 10 mg by mouth daily.    [provider]  benztropine (COGENTIN) 2 MG tablet Take 2 mg by mouth daily.    [provider]  budesonide-formoterol (SYMBICORT) 160-4.5 MCG/ACT inhaler Inhale 2 puffs into the lungs 2 (two) times daily. 12/10/16   Hoy Register, MD  divalproex (DEPAKOTE) 250 MG DR tablet Take 750 mg by mouth at bedtime. 11/24/16   [provider]  doxycycline (VIBRAMYCIN) 100 MG capsule Take 1 capsule (100 mg total) by mouth 2 (two) times daily. Patient not taking: Reported on 12/25/2017 01/18/17   Gilda Crease, MD  escitalopram (LEXAPRO) 10 MG tablet Take 10 mg by mouth daily. 11/24/16   [provider]  fluticasone (FLONASE) 50 MCG/ACT nasal spray Place 2 sprays into both nostrils daily. 12/10/16   Hoy Register, MD  lisinopril-hydrochlorothiazide (PRINZIDE,ZESTORETIC) 20-25 MG tablet  Take 1 tablet by mouth daily. 12/10/16   Hoy Register, MD  nicotine (NICODERM CQ - DOSED IN MG/24 HOURS) 14 mg/24hr patch Place 1 patch (14 mg total) onto the skin daily. 12/10/16   Hoy Register, MD  pantoprazole (PROTONIX) 40 MG tablet Take 1 tablet (40 mg total) by mouth daily. 12/10/16   Hoy Register, MD  predniSONE (DELTASONE) 50 MG tablet Take 1 tablet (50 mg total) by mouth daily for 5 days. 01/05/18 01/10/18  Dartha Lodge, PA-C  tiotropium  (SPIRIVA HANDIHALER) 18 MCG inhalation capsule Place 1 capsule (18 mcg total) into inhaler and inhale daily. 12/10/16   Hoy Register, MD    Family History No family history on file.  Social History Social History   Tobacco Use  . Smoking status: Current Every Day Smoker    Packs/day: 0.15    Types: Cigarettes  . Smokeless tobacco: Never Used  Substance Use Topics  . Alcohol use: No  . Drug use: No     Allergies   Haldol [haloperidol]; Pork-derived products; and Prozac [fluoxetine hcl]   Review of Systems Review of Systems  Constitutional: Negative for chills and fever.  HENT: Negative.   Eyes: Negative for visual disturbance.  Respiratory: Positive for chest tightness, shortness of breath and wheezing. Negative for cough and stridor.   Cardiovascular: Negative for chest pain.  Gastrointestinal: Negative for abdominal pain, nausea and vomiting.  Musculoskeletal: Negative for myalgias.  Skin: Negative for color change and rash.  Neurological: Negative for dizziness, syncope and light-headedness.  All other systems reviewed and are negative.    Physical Exam Updated Vital Signs Blood pressure 125/74, pulse 80, temperature 98 F (36.7 C), temperature source Oral, resp. rate 15, last menstrual period 12/29/2017, SpO2 100 %.  Physical Exam  Constitutional: She is oriented to person, place, and time. She appears well-developed and well-nourished. No distress.  Patient appears calm and in no acute distress  HENT:  Head: Normocephalic and atraumatic.  Mouth/Throat: Oropharynx is clear and moist.  Eyes: Right eye exhibits no discharge. Left eye exhibits no discharge.  Neck: Normal range of motion. Neck supple.  No stridor  Cardiovascular: Normal rate, regular rhythm, normal heart sounds and intact distal pulses. Exam reveals no gallop and no friction rub.  No murmur heard. Pulmonary/Chest: No respiratory distress. She has wheezes.  On arrival patient with mildly  increased respiratory effort, respiratory rate of 23, able to speak in full sentences but does seem somewhat exasperated, on exam patient has wheezes diffusely throughout bilateral lung fields with slightly diminished air movement.  Abdominal: Soft. Bowel sounds are normal. She exhibits no distension and no mass. There is no tenderness. There is no guarding.  Abdomen soft, nondistended, nontender to palpation in all quadrants without guarding or peritoneal signs  Musculoskeletal: She exhibits no deformity.  Neurological: She is alert and oriented to person, place, and time. Coordination normal.  Skin: Skin is warm and dry. Capillary refill takes less than 2 seconds. She is not diaphoretic.  Psychiatric: She has a normal mood and affect. Her behavior is normal.  Nursing note and vitals reviewed.    ED Treatments / Results  Labs (all labs ordered are listed, but only abnormal results are displayed) Labs Reviewed  BASIC METABOLIC PANEL - Abnormal; Notable for the following components:      Result Value   Glucose, Bld 124 (*)    Calcium 8.4 (*)    All other components within normal limits  CBC WITH DIFFERENTIAL/PLATELET  EKG EKG Interpretation  Date/Time:  Sunday January 05 2018 17:08:58 EST Ventricular Rate:  73 PR Interval:    QRS Duration: 94 QT Interval:  393 QTC Calculation: 433 R Axis:   -23 Text Interpretation:  Sinus rhythm Borderline left axis deviation No significant change was found Confirmed by Azalia Bilis ([id number]) on 01/05/2018 7:36:17 PM   Radiology Dg Chest 2 View  Result Date: 01/05/2018 CLINICAL DATA:  Shortness of breath. EXAM: CHEST - 2 VIEW COMPARISON:  Radiograph of March 02, 2017. FINDINGS: The heart size and mediastinal contours are within normal limits. Both lungs are clear. No pneumothorax or pleural effusion is noted. The visualized skeletal structures are unremarkable. IMPRESSION: No active cardiopulmonary disease. Electronically Signed   By: Lupita Raider, M.D.   On: 01/05/2018 18:14    Procedures Procedures (including critical care time)  Medications Ordered in ED Medications  ipratropium-albuterol (DUONEB) 0.5-2.5 (3) MG/3ML nebulizer solution 3 mL (3 mLs Nebulization Given 01/05/18 1728)  albuterol (PROVENTIL) (2.5 MG/3ML) 0.083% nebulizer solution 2.5 mg (2.5 mg Nebulization Given 01/05/18 1728)  albuterol (PROVENTIL) (2.5 MG/3ML) 0.083% nebulizer solution 5 mg (5 mg Nebulization Given 01/05/18 1924)  albuterol (PROVENTIL HFA;VENTOLIN HFA) 108 (90 Base) MCG/ACT inhaler 2 puff (2 puffs Inhalation Given 01/05/18 2044)  aerochamber Z-Stat Plus/medium 1 each (1 each Other Given 01/05/18 2044)     Initial Impression / Assessment and Plan / ED Course  I have reviewed the triage vital signs and the nursing notes.  Pertinent labs & imaging results that were available during my care of the patient were reviewed by me and considered in my medical decision making (see chart for details).  She presents for evaluation of 3 days of shortness of breath and wheezing which seems consistent with her prior COPD exacerbations, was out of her albuterol inhaler to try and treat symptoms at home.  On arrival she is satting well on room air and appears to be in no acute respiratory distress, received albuterol, Atrovent and Solu-Medrol in transit with EMS.  On exam she still has diffuse wheezing throughout bilateral lung fields with some decreased breath sounds.  Will get chest x-ray, EKG and basic labs, and give additional nebulizer treatment and reevaluate.  Chest x-ray shows no evidence of pneumonia or other active cardiopulmonary disease, EKG without concerning ischemic changes.  Labs show no leukocytosis, Normal hemoglobin and no acute electrolyte derangements requiring intervention, normal renal function.  On reevaluation significant improvement in wheezing and patient reports she is feeling much better, will allow patient to space out and receive  more of the benefit from steroids, give 1 additional nebulizer treatment and then see how patient does ambulating throughout the department.  After additional nebulizer treatment patient ambulated while maintaining O2 saturations between 94-100% reports she is feeling significantly better, lungs with only faint wheezes scattered, and good air movement throughout.  At this time I feel patient is stable for discharge home will give 5-day burst of steroids, provided with albuterol healer and spacer here in the emergency department instructed to use this regularly for the next 24 hours and then as needed.  Patient to follow-up with her primary care doctor.  Return precautions discussed.  Patient expresses understanding and is in agreement with this plan.  Final Clinical Impressions(s) / ED Diagnoses   Final diagnoses:  COPD with acute exacerbation [facility])    ED Discharge Orders         Ordered    predniSONE (DELTASONE) 50 MG tablet  Daily  01/05/18 2036           Dartha Lodge, PA-C 01/05/18 2138    Azalia Bilis, MD 01/05/18 [phone]

## 2018-03-21 ENCOUNTER — Emergency Department (HOSPITAL_COMMUNITY)
Admission: EM | Admit: 2018-03-21 | Discharge: 2018-03-22 | Disposition: A | Discharge status: Home / Self Care | Payer: Medicaid Other | Attending: Emergency Medicine | Admitting: Emergency Medicine

## 2018-03-21 DIAGNOSIS — J449 Chronic obstructive pulmonary disease, unspecified: Secondary | ICD-10-CM | POA: Diagnosis not present

## 2018-03-21 DIAGNOSIS — F251 Schizoaffective disorder, depressive type: Secondary | ICD-10-CM | POA: Diagnosis present

## 2018-03-21 DIAGNOSIS — Z79899 Other long term (current) drug therapy: Secondary | ICD-10-CM | POA: Diagnosis not present

## 2018-03-21 DIAGNOSIS — I10 Essential (primary) hypertension: Secondary | ICD-10-CM | POA: Diagnosis not present

## 2018-03-21 DIAGNOSIS — F259 Schizoaffective disorder, unspecified: Secondary | ICD-10-CM | POA: Diagnosis present

## 2018-03-21 DIAGNOSIS — F17210 Nicotine dependence, cigarettes, uncomplicated: Secondary | ICD-10-CM | POA: Diagnosis not present

## 2018-03-21 LAB — COMPREHENSIVE METABOLIC PANEL
ALBUMIN: 4 g/dL (ref 3.5–5.0)
ALT: 17 U/L (ref 0–44)
AST: 34 U/L (ref 15–41)
Alkaline Phosphatase: 69 U/L (ref 38–126)
Anion gap: 5 (ref 5–15)
BUN: 12 mg/dL (ref 6–20)
CO2: 27 mmol/L (ref 22–32)
Calcium: 8.5 mg/dL — ABNORMAL LOW (ref 8.9–10.3)
Chloride: 111 mmol/L (ref 98–111)
Creatinine, Ser: 0.86 mg/dL (ref 0.44–1.00)
GFR calc Af Amer: >60 mL/min (ref >60)
GFR calc non Af Amer: >60 mL/min (ref >60)
GLUCOSE: 121 mg/dL — AB (ref 70–99)
Potassium: 3.8 mmol/L (ref 3.5–5.1)
Sodium: 143 mmol/L (ref 135–145)
Total Bilirubin: 0.3 mg/dL (ref 0.3–1.2)
Total Protein: 7 g/dL (ref 6.5–8.1)

## 2018-03-21 LAB — SALICYLATE LEVEL: Salicylate Lvl: <7 mg/dL (ref 2.8–30.0)

## 2018-03-21 LAB — CBC
HCT: 43.7 % (ref 36.0–46.0)
Hemoglobin: 13.5 g/dL (ref 12.0–15.0)
MCH: 28.4 pg (ref 26.0–34.0)
MCHC: 30.9 g/dL (ref 30.0–36.0)
MCV: 92 fL (ref 80.0–100.0)
Platelets: 227 10*3/uL (ref 150–400)
RBC: 4.75 MIL/uL (ref 3.87–5.11)
RDW: 13.8 % (ref 11.5–15.5)
WBC: 10.7 10*3/uL — ABNORMAL HIGH (ref 4.0–10.5)
nRBC: 0 % (ref 0.0–0.2)

## 2018-03-21 LAB — ETHANOL: Alcohol, Ethyl (B): <10 mg/dL (ref <10)

## 2018-03-21 LAB — I-STAT TROPONIN, ED: TROPONIN I, POC: 0 ng/mL (ref 0.00–0.08)

## 2018-03-21 LAB — ACETAMINOPHEN LEVEL: Acetaminophen (Tylenol), Serum: <10 ug/mL — ABNORMAL LOW (ref 10–30)

## 2018-03-21 LAB — I-STAT BETA HCG BLOOD, ED (MC, WL, AP ONLY): I-stat hCG, quantitative: <5 m[IU]/mL (ref <5)

## 2018-03-21 LAB — VALPROIC ACID LEVEL: Valproic Acid Lvl: <10 ug/mL — ABNORMAL LOW (ref 50.0–100.0)

## 2018-03-21 MED ORDER — LORAZEPAM 2 MG/ML IJ SOLN
1.0000 mg | Freq: Once | INTRAMUSCULAR | Status: AC
  Administered 2018-03-21: 1 mg via INTRAMUSCULAR
  Filled 2018-03-21: qty 1

## 2018-03-21 MED ORDER — ZIPRASIDONE MESYLATE 20 MG IM SOLR
10.0000 mg | Freq: Once | INTRAMUSCULAR | Status: AC
  Administered 2018-03-21: 10 mg via INTRAMUSCULAR
  Filled 2018-03-21: qty 20

## 2018-03-21 NOTE — ED Provider Notes (Signed)
[facility]-EMERGENCY DEPT Provider Note   CSN: [id number] Arrival date & time: 03/21/18  1750     History   Chief Complaint Chief Complaint  Patient presents with  . Medical Clearance  . Delusional    HPI Melissa Dickerson is a 48 y.o. female.  HPI Level 5 caveat due to psychiatric disorder. Patient reportedly called EMS because of things at her group home.  States they are racist.  Patient states she is worried she is not can be able to go back however.  States she is been homeless before.  History of schizoaffective disorder is not been taking her medicines.  Really cannot provide much history now however.  Denies suicidal homicidal thoughts.  Denies hallucinations. Past Medical History:  Diagnosis Date  . COPD (chronic obstructive pulmonary disease) (HCC)   . Schizoaffective disorder [facility])     Patient Active Problem List   Diagnosis Date Noted  . Tobacco abuse 12/10/2016  . GERD (gastroesophageal reflux disease) 12/10/2016  . HTN (hypertension) 12/05/2016  . COPD exacerbation (HCC) 12/04/2016  . Schizoaffective disorder, depressive type (HCC) 03/12/2016    Past Surgical History:  Procedure Laterality Date  . CHOLECYSTECTOMY       OB History   No obstetric history on file.      Home Medications    Prior to Admission medications   Medication Sig Start Date End Date Taking? Authorizing Provider  albuterol (PROVENTIL HFA;VENTOLIN HFA) 108 (90 Base) MCG/ACT inhaler Inhale 2 puffs into the lungs every 6 (six) hours as needed for wheezing or shortness of breath. Patient not taking: Reported on 12/25/2017 12/10/16   Hoy Register, MD  albuterol (PROVENTIL HFA;VENTOLIN HFA) 108 (90 Base) MCG/ACT inhaler Inhale 2 puffs into the lungs every 4 (four) hours as needed for wheezing or shortness of breath. 12/26/16   Horton, Mayer Masker, MD  ARIPiprazole (ABILIFY) 10 MG tablet Take 10 mg by mouth daily.    [provider]  benztropine (COGENTIN)  2 MG tablet Take 2 mg by mouth daily.    [provider]  budesonide-formoterol (SYMBICORT) 160-4.5 MCG/ACT inhaler Inhale 2 puffs into the lungs 2 (two) times daily. 12/10/16   Hoy Register, MD  divalproex (DEPAKOTE) 250 MG DR tablet Take 750 mg by mouth at bedtime. 11/24/16   [provider]  doxycycline (VIBRAMYCIN) 100 MG capsule Take 1 capsule (100 mg total) by mouth 2 (two) times daily. Patient not taking: Reported on 12/25/2017 01/18/17   Gilda Crease, MD  escitalopram (LEXAPRO) 10 MG tablet Take 10 mg by mouth daily. 11/24/16   [provider]  fluticasone (FLONASE) 50 MCG/ACT nasal spray Place 2 sprays into both nostrils daily. 12/10/16   Hoy Register, MD  lisinopril-hydrochlorothiazide (PRINZIDE,ZESTORETIC) 20-25 MG tablet Take 1 tablet by mouth daily. 12/10/16   Hoy Register, MD  nicotine (NICODERM CQ - DOSED IN MG/24 HOURS) 14 mg/24hr patch Place 1 patch (14 mg total) onto the skin daily. 12/10/16   Hoy Register, MD  pantoprazole (PROTONIX) 40 MG tablet Take 1 tablet (40 mg total) by mouth daily. 12/10/16   Hoy Register, MD  tiotropium (SPIRIVA HANDIHALER) 18 MCG inhalation capsule Place 1 capsule (18 mcg total) into inhaler and inhale daily. 12/10/16   Hoy Register, MD    Family History No family history on file.  Social History Social History   Tobacco Use  . Smoking status: Current Every Day Smoker    Packs/day: 0.15    Types: Cigarettes  . Smokeless tobacco: Never  Used  Substance Use Topics  . Alcohol use: No  . Drug use: No     Allergies   Haldol [haloperidol]; Pork-derived products; and Prozac [fluoxetine hcl]   Review of Systems Review of Systems  Unable to perform ROS: Psychiatric disorder     Physical Exam Updated Vital Signs BP (!) 163/97 (BP Location: Left Arm) Comment: RN notified  Pulse 86   Temp 98.3 F (36.8 C) (Oral)   Resp (!) 26 Comment: RN notified  LMP  (LMP Unknown)   SpO2 93%    Physical Exam HENT:     Head: Normocephalic.     Mouth/Throat:     Mouth: Mucous membranes are moist.  Eyes:     General: No scleral icterus. Neck:     Musculoskeletal: Neck supple.  Cardiovascular:     Rate and Rhythm: Regular rhythm.  Pulmonary:     Effort: Pulmonary effort is normal.  Abdominal:     Tenderness: There is no abdominal tenderness.  Skin:    Coloration: Skin is not jaundiced.  Neurological:     General: No focal deficit present.     Mental Status: She is alert.     Comments: Awake and able to answer questions but somewhat pressured.  Psychiatric:     Comments: Somewhat pressured speech.  Continuously moving her fingers.      ED Treatments / Results  Labs (all labs ordered are listed, but only abnormal results are displayed) Labs Reviewed  COMPREHENSIVE METABOLIC PANEL - Abnormal; Notable for the following components:      Result Value   Glucose, Bld 121 (*)    Calcium 8.5 (*)    All other components within normal limits  ACETAMINOPHEN LEVEL - Abnormal; Notable for the following components:   Acetaminophen (Tylenol), Serum <10 (*)    All other components within normal limits  CBC - Abnormal; Notable for the following components:   WBC 10.7 (*)    All other components within normal limits  VALPROIC ACID LEVEL - Abnormal; Notable for the following components:   Valproic Acid Lvl <10 (*)    All other components within normal limits  ETHANOL  SALICYLATE LEVEL  RAPID URINE DRUG SCREEN, HOSP PERFORMED  I-STAT BETA HCG BLOOD, ED ([facility], [facility], [facility] ONLY)  I-STAT TROPONIN, ED    EKG None  Radiology No results found.  Procedures Procedures (including critical care time)  Medications Ordered in ED Medications  ziprasidone (GEODON) injection 10 mg (10 mg Intramuscular Given 03/21/18 2114)  LORazepam (ATIVAN) injection 1 mg (1 mg Intramuscular Given 03/21/18 2114)     Initial Impression / Assessment and Plan / ED Course  I have reviewed the triage  vital signs and the nursing notes.  Pertinent labs & imaging results that were available during my care of the patient were reviewed by me and considered in my medical decision making (see chart for details).    Patient presents after calling EMS.  Psychiatric history.  Reportedly stopped taking medicines.  Difficult to get a history from.  Appears to be medically cleared.  Seen by TTS and inpatient treatment recommended.-  Final Clinical Impressions(s) / ED Diagnoses   Final diagnoses:  Schizoaffective disorder, unspecified type [facility])    ED Discharge Orders    None       Benjiman Core, MD 03/21/18 2252

## 2018-03-21 NOTE — ED Notes (Addendum)
Pt agitated, yelling in halls. Stating " I don't want black people in my room." pt became intrusive. Pt given medications see emar. Pt safe, will continue to monitor.

## 2018-03-21 NOTE — ED Notes (Signed)
[id number] Expected date:  Expected time:  Means of arrival:  Comments: EMS 47yo mental eval

## 2018-03-21 NOTE — ED Notes (Signed)
Pt alert and uncooperative. Pt denies any si,hi ,or avh at this time. Pt disorganized and answers questions randomly. Pt refuses vital signs. Pt states " only the doctor makes decisions for me". Pt safe, will continue to monitor.

## 2018-03-21 NOTE — ED Notes (Signed)
Pt refused vitals 

## 2018-03-21 NOTE — ED Triage Notes (Signed)
Patient called EMS to pick her up from group home.  Patient reports she is schizoaffective and has not been taking her medications.

## 2018-03-21 NOTE — BH Assessment (Addendum)
Assessment Note  Melissa Dickerson is an 48 y.o. female who presents to the ED voluntarily. TTS observed the pt talking to herself and responding to internal stimuli upon entering the pt's room. Pt states she came to the ED because she does not feel safe in her current living situation. Pt states she feels that people are racist towards her. Pt is speaking in tangents throughout the assessment and her speech patterns are erratic. Pt states "that is white supremacy." Pt states she saw several Black men standing around her all wearing white suits. Pt states she received OPT in [place] but denies she has a current provider. TTS asked the pt when did she move to [facility] and pt stated "you know too much about me. How did you know that?" Pt continues to express paranoid ideations during the assessment and states she believes that this Clinical research associate is a member of the house and that I have been sent here to fraud her. Pt also suspects that I am the wife of the leader of the house and states she no longer wishes to speak to TTS because "you guys are all working together trying to hurt me."  Per Nira Conn, NP pt meets criteria for inpt treatment. EDP Benjiman Core, MD and pt's nurse Linus Galas, RN have been advised.   Diagnosis: Schizoaffective disorder  Past Medical History:  Past Medical History:  Diagnosis Date  . COPD (chronic obstructive pulmonary disease) (HCC)   . Schizoaffective disorder [facility])     Past Surgical History:  Procedure Laterality Date  . CHOLECYSTECTOMY      Family History: No family history on file.  Social History:  reports that she has been smoking cigarettes. She has been smoking about 0.15 packs per day. She has never used smokeless tobacco. She reports that she does not drink alcohol or use drugs.  Additional Social History:  Alcohol / Drug Use Pain Medications: See MAR Prescriptions: See MAR Over the Counter: See MAR History of alcohol / drug use?: No history of alcohol /  drug abuse  CIWA: CIWA-Ar BP: (!) 163/97(RN notified) Pulse Rate: 86 COWS:    Allergies:  Allergies  Allergen Reactions  . Haldol [Haloperidol]     rash  . Pork-Derived Products Other (See Comments)  . Prozac [Fluoxetine Hcl] Rash    Home Medications: (Not in a hospital admission)   OB/GYN Status:  No LMP recorded (lmp unknown).  General Assessment Data Location of Assessment: [facility] ED TTS Assessment: In system Is this a Tele or Face-to-Face Assessment?: Face-to-Face Is this an Initial Assessment or a Re-assessment for this encounter?: Initial Assessment Patient Accompanied by:: N/A Language Other than English: No Living Arrangements: In Group Home: (Comment: Name of Group Home) What gender do you identify as?: Female Marital status: Single Pregnancy Status: No Living Arrangements: Group Home Can pt return to current living arrangement?: Yes Admission Status: Voluntary Is patient capable of signing voluntary admission?: Yes Referral Source: Self/Family/Friend Insurance type: MCD     Crisis Care Plan Living Arrangements: Group Home Name of Psychiatrist: pt denies current provider Name of Therapist: pt denies current provider  Education Status Is patient currently in school?: No Is the patient employed, unemployed or receiving disability?: Receiving disability income  Risk to self with the past 6 months Suicidal Ideation: No Has patient been a risk to self within the past 6 months prior to admission? : No Suicidal Intent: No Has patient had any suicidal intent within the past 6 months prior to  admission? : No Is patient at risk for suicide?: No Suicidal Plan?: No Has patient had any suicidal plan within the past 6 months prior to admission? : No Access to Means: No What has been your use of drugs/alcohol within the last 12 months?: pt denies Previous Attempts/Gestures: No Triggers for Past Attempts: None known Intentional Self Injurious Behavior: None Family  Suicide History: No Recent stressful life event(s): Other (Comment)(delusions) Persecutory voices/beliefs?: No Depression: No Substance abuse history and/or treatment for substance abuse?: No Suicide prevention information given to non-admitted patients: Not applicable  Risk to Others within the past 6 months Homicidal Ideation: No Does patient have any lifetime risk of violence toward others beyond the six months prior to admission? : No Thoughts of Harm to Others: No Current Homicidal Intent: No Current Homicidal Plan: No Access to Homicidal Means: No History of harm to others?: No Assessment of Violence: None Noted Does patient have access to weapons?: No Criminal Charges Pending?: No Does patient have a court date: No Is patient on probation?: No  Psychosis Hallucinations: Auditory Delusions: Persecutory  Mental Status Report Appearance/Hygiene: Disheveled Eye Contact: Good Motor Activity: Restlessness Speech: Tangential Level of Consciousness: Restless Mood: Anxious, Suspicious, Labile Affect: Anxious, Frightened, Inconsistent with thought content, Labile Anxiety Level: Severe Thought Processes: Tangential, Flight of Ideas Judgement: Impaired Orientation: Person Obsessive Compulsive Thoughts/Behaviors: None  Cognitive Functioning Concentration: Decreased Memory: Remote Intact, Recent Intact Is patient IDD: No Insight: Poor Impulse Control: Poor Appetite: Good Have you had any weight changes? : No Change Sleep: Unable to Assess Vegetative Symptoms: None  ADLScreening [facility]) Patient's cognitive ability adequate to safely complete daily activities?: Yes Patient able to express need for assistance with ADLs?: Yes Independently performs ADLs?: Yes (appropriate for developmental age)  Prior Inpatient Therapy Prior Inpatient Therapy: (UTA due to AMS)  Prior Outpatient Therapy Prior Outpatient Therapy: Yes Prior Therapy Dates: unknown Prior  Therapy Facilty/Provider(s): facility in [place] Reason for Treatment: unknown Does patient have an ACCT team?: Unknown Does patient have Intensive In-House Services?  : Unknown Does patient have [facility] services? : Unknown Does patient have P4CC services?: Unknown  ADL Screening (condition at time of admission) Patient's cognitive ability adequate to safely complete daily activities?: Yes Is the patient deaf or have difficulty hearing?: No Does the patient have difficulty seeing, even when wearing glasses/contacts?: No Does the patient have difficulty concentrating, remembering, or making decisions?: Yes Patient able to express need for assistance with ADLs?: Yes Does the patient have difficulty dressing or bathing?: No Independently performs ADLs?: Yes (appropriate for developmental age) Does the patient have difficulty walking or climbing stairs?: No Weakness of Legs: None Weakness of Arms/Hands: None  Home Assistive Devices/Equipment Home Assistive Devices/Equipment: None    Abuse/Neglect Assessment (Assessment to be complete while patient is alone) Abuse/Neglect Assessment Can Be Completed: Unable to assess, patient is non-responsive or altered mental status     Advance Directives (For Healthcare) Does Patient Have a Medical Advance Directive?: No Would patient like information on creating a medical advance directive?: No - Patient declined          Disposition: Per Nira Conn, NP pt meets criteria for inpt treatment. EDP Benjiman Core, MD and pt's nurse Linus Galas, RN have been advised.   Disposition Initial Assessment Completed for this Encounter: Yes Disposition of Patient: Admit Type of inpatient treatment program: Adult(per Nira Conn, NP) Patient refused recommended treatment: No  On Site Evaluation by:   Reviewed with Physician:  Karolee Ohsquicha R Kabella Cassidy 03/21/2018 8:06 PM

## 2018-03-21 NOTE — Progress Notes (Signed)
Per Nira Conn, NP pt meets criteria for inpt treatment. EDP Benjiman Core, MD and pt's nurse Linus Galas, RN have been advised.   Princess Bruins, MSW, LCSW Therapeutic Triage Specialist  [phone]

## 2018-03-21 NOTE — ED Notes (Signed)
Patient reports she has been off her medications for schizoaffective disorder and is currently living somewhere she doesn't feel safe.  Patient is poor historian, but was able to say she is not suicidal or homicidal and not hearing voices or seeing things that aren't there.  Patient appears to be mildly confused, anxious, with impaired concentration.  Patient unable to sit still, with constant movements in her hands and arms.

## 2018-03-22 DIAGNOSIS — F251 Schizoaffective disorder, depressive type: Secondary | ICD-10-CM

## 2018-03-22 LAB — RAPID URINE DRUG SCREEN, HOSP PERFORMED
Amphetamines: NOT DETECTED
Barbiturates: NOT DETECTED
Benzodiazepines: NOT DETECTED
Cocaine: POSITIVE — AB
Opiates: NOT DETECTED
Tetrahydrocannabinol: POSITIVE — AB

## 2018-03-22 NOTE — Consult Note (Addendum)
[facility] Psych ED Discharge  03/22/2018 11:35 AM Melissa Dickerson  MRN:  [id number] Principal Problem: Schizoaffective disorder, depressive type [facility]) Discharge Diagnoses:   Subjective:  On admission per TTS:  Melissa Dickerson is an 48 y.o. female who presents to the ED voluntarily. TTS observed the pt talking to herself and responding to internal stimuli upon entering the pt's room. Pt states she came to the ED because she does not feel safe in her current living situation. Pt states she feels that people are racist towards her. Pt is speaking in tangents throughout the assessment and her speech patterns are erratic. Pt states "that is white supremacy." Pt states she saw several Black men standing around her all wearing white suits. Pt states she received OPT in [place] but denies she has a current provider. TTS asked the pt when did she move to [facility] and pt stated "you know too much about me. How did you know that?" Pt continues to express paranoid ideations during the assessment and states she believes that this Clinical research associate is a member of the house and that I have been sent here to fraud her. Pt also suspects that I am the wife of the leader of the house and states she no longer wishes to speak to TTS because "you guys are all working together trying to hurt me."  On evaluation today, patient is resting in bed with the lights off.  When mental health team entered her room, she sat up and dangled at the bedside.  She is calm and cooperative and  willing to engage treatment team in discussion.  Patient is alert and oriented.  She minimizes her cocaine usage, denies addiction.  She denies suicidal or homicidal ideations.  Denies audible or visual hallucinations.  She does not demonstrate anxiousness, confusion or impaired concentration.  Patient reports verbal disagreement with her landlord prior to admission.  Now states once discharged she will communicate with her payee to address the concern.  She states she is ready for  discharge today and agreeable to meeting with peer support prior to leaving.    Total Time spent with patient: 45 minutes  Past Psychiatric History: Schizoaffective disorder  Past Medical History:  Past Medical History:  Diagnosis Date  . COPD (chronic obstructive pulmonary disease) (HCC)   . Schizoaffective disorder [facility])     Past Surgical History:  Procedure Laterality Date  . CHOLECYSTECTOMY     Family History: No family history on file. Family Psychiatric  History: none Social History:  Social History   Substance and Sexual Activity  Alcohol Use No     Social History   Substance and Sexual Activity  Drug Use No    Social History   Socioeconomic History  . Marital status: Divorced    Spouse name: Not on file  . Number of children: Not on file  . Years of education: Not on file  . Highest education level: Not on file  Occupational History  . Not on file  Social Needs  . Financial resource strain: Not on file  . Food insecurity:    Worry: Not on file    Inability: Not on file  . Transportation needs:    Medical: Not on file    Non-medical: Not on file  Tobacco Use  . Smoking status: Current Every Day Smoker    Packs/day: 0.15    Types: Cigarettes  . Smokeless tobacco: Never Used  Substance and Sexual Activity  . Alcohol use: No  . Drug use:  No  . Sexual activity: Not on file  Lifestyle  . Physical activity:    Days per week: Not on file    Minutes per session: Not on file  . Stress: Not on file  Relationships  . Social connections:    Talks on phone: Not on file    Gets together: Not on file    Attends religious service: Not on file    Active member of club or organization: Not on file    Attends meetings of clubs or organizations: Not on file    Relationship status: Not on file  Other Topics Concern  . Not on file  Social History Narrative  . Not on file    Has this patient used any form of tobacco in the last 30 days? (Cigarettes,  Smokeless Tobacco, Cigars, and/or Pipes) NA  Current Medications: No current facility-administered medications for this encounter.    Current Outpatient Medications  Medication Sig Dispense Refill  . albuterol (PROVENTIL HFA;VENTOLIN HFA) 108 (90 Base) MCG/ACT inhaler Inhale 2 puffs into the lungs every 6 (six) hours as needed for wheezing or shortness of breath. (Patient not taking: Reported on 12/25/2017) 1 Inhaler 3  . albuterol (PROVENTIL HFA;VENTOLIN HFA) 108 (90 Base) MCG/ACT inhaler Inhale 2 puffs into the lungs every 4 (four) hours as needed for wheezing or shortness of breath. (Patient not taking: Reported on 03/22/2018) 1 Inhaler 0  . budesonide-formoterol (SYMBICORT) 160-4.5 MCG/ACT inhaler Inhale 2 puffs into the lungs 2 (two) times daily. (Patient not taking: Reported on 03/22/2018) 1 Inhaler 5  . doxycycline (VIBRAMYCIN) 100 MG capsule Take 1 capsule (100 mg total) by mouth 2 (two) times daily. (Patient not taking: Reported on 12/25/2017) 20 capsule 0  . fluticasone (FLONASE) 50 MCG/ACT nasal spray Place 2 sprays into both nostrils daily. (Patient not taking: Reported on 03/22/2018) 16 g 5  . lisinopril-hydrochlorothiazide (PRINZIDE,ZESTORETIC) 20-25 MG tablet Take 1 tablet by mouth daily. (Patient not taking: Reported on 03/22/2018) 30 tablet 5  . nicotine (NICODERM CQ - DOSED IN MG/24 HOURS) 14 mg/24hr patch Place 1 patch (14 mg total) onto the skin daily. (Patient not taking: Reported on 03/22/2018) 28 patch 1  . pantoprazole (PROTONIX) 40 MG tablet Take 1 tablet (40 mg total) by mouth daily. (Patient not taking: Reported on 03/22/2018) 30 tablet 5  . tiotropium (SPIRIVA HANDIHALER) 18 MCG inhalation capsule Place 1 capsule (18 mcg total) into inhaler and inhale daily. (Patient not taking: Reported on 03/22/2018) 30 capsule 5   PTA Medications: (Not in a hospital admission)   Musculoskeletal: Strength & Muscle Tone: within normal limits Gait & Station: normal Patient leans:  N/A  Psychiatric Specialty Exam: Physical Exam  Nursing note and vitals reviewed. Constitutional: She is oriented to person, place, and time. She appears well-developed and well-nourished.  HENT:  Head: Normocephalic.  Respiratory: Effort normal.  Musculoskeletal: Normal range of motion.  Neurological: She is alert and oriented to person, place, and time.  Psychiatric: She has a normal mood and affect. Her speech is normal and behavior is normal. Judgment and thought content normal. Cognition and memory are normal.    Review of Systems  Psychiatric/Behavioral: Positive for substance abuse. The patient is nervous/anxious.   All other systems reviewed and are negative.   Blood pressure (!) 163/97, pulse 86, temperature 98.3 F (36.8 C), temperature source Oral, resp. rate (!) 26, SpO2 93 %.There is no height or weight on file to calculate BMI.  General Appearance: Casual  Eye Contact:  Good  Speech:  Normal Rate  Volume:  Normal  Mood:  Irritable  Affect:  Congruent  Thought Process:  Coherent  Orientation:  Full (Time, Place, and Person)  Thought Content:  Logical  Suicidal Thoughts:  No  Homicidal Thoughts:  No  Memory:  Immediate;   Good Recent;   Good Remote;   Good  Judgement:  Good  Insight:  Good  Psychomotor Activity:  Normal  Concentration:  Concentration: Good and Attention Span: Good  Recall:  Good  Fund of Knowledge:  Fair  Language:  Good  Akathisia:  Negative  Handed:  Right  AIMS (if indicated):     Assets:  Communication Skills Desire for Improvement Resilience Social Support  ADL's:  Intact  Cognition:  WNL  Sleep:        Demographic Factors:  Caucasian  Loss Factors: NA  Historical Factors: NA  Risk Reduction Factors:   Positive social support, Positive therapeutic relationship and Positive coping skills or problem solving skills  Continued Clinical Symptoms:  Anxiety, mild  Cognitive Features That Contribute To Risk:  None     Suicide Risk:  Minimal: No identifiable suicidal ideation.  Patients presenting with no risk factors but with morbid ruminations; may be classified as minimal risk based on the severity of the depressive symptoms   Plan Of Care/Follow-up recommendations:  Schizoaffective disorder, bipolar type: -Referred to her PCP as she reports she does not take medications -Social work consult Activity:  as tolerated Diet:  heart healthy diet  Disposition: discharge home Nanine MeansLORD, JAMISON, NP 03/22/2018, 11:35 AM  Patient seen face-to-face for psychiatric evaluation, chart reviewed and case discussed with the physician extender and developed treatment plan. Reviewed the information documented and agree with the treatment plan. Thedore MinsMojeed Mitchell Epling, MD

## 2018-03-23 ENCOUNTER — Emergency Department (HOSPITAL_COMMUNITY)
Admission: EM | Admit: 2018-03-23 | Discharge: 2018-03-26 | Disposition: A | Discharge status: Other Acute Inpatient Hospital | Payer: Medicaid Other | Attending: Emergency Medicine | Admitting: Emergency Medicine

## 2018-03-23 ENCOUNTER — Encounter (HOSPITAL_COMMUNITY): Payer: Self-pay | Admitting: Behavioral Health

## 2018-03-23 ENCOUNTER — Other Ambulatory Visit: Payer: Self-pay

## 2018-03-23 DIAGNOSIS — Z0489 Encounter for examination and observation for other specified reasons: Secondary | ICD-10-CM | POA: Diagnosis present

## 2018-03-23 DIAGNOSIS — F17210 Nicotine dependence, cigarettes, uncomplicated: Secondary | ICD-10-CM | POA: Diagnosis not present

## 2018-03-23 DIAGNOSIS — F259 Schizoaffective disorder, unspecified: Secondary | ICD-10-CM | POA: Diagnosis not present

## 2018-03-23 DIAGNOSIS — R45851 Suicidal ideations: Secondary | ICD-10-CM | POA: Insufficient documentation

## 2018-03-23 DIAGNOSIS — J449 Chronic obstructive pulmonary disease, unspecified: Secondary | ICD-10-CM | POA: Insufficient documentation

## 2018-03-23 LAB — URINALYSIS, ROUTINE W REFLEX MICROSCOPIC
Bilirubin Urine: NEGATIVE
Glucose, UA: NEGATIVE mg/dL
Hgb urine dipstick: NEGATIVE
Ketones, ur: 5 mg/dL — AB
LEUKOCYTES UA: NEGATIVE
Nitrite: NEGATIVE
Protein, ur: NEGATIVE mg/dL
Specific Gravity, Urine: 1.01 (ref 1.005–1.030)
pH: 7 (ref 5.0–8.0)

## 2018-03-23 LAB — CBC WITH DIFFERENTIAL/PLATELET
Abs Immature Granulocytes: 0.07 10*3/uL (ref 0.00–0.07)
BASOS PCT: 1 %
Basophils Absolute: 0 10*3/uL (ref 0.0–0.1)
Eosinophils Absolute: 0.1 10*3/uL (ref 0.0–0.5)
Eosinophils Relative: 2 %
HCT: 43.6 % (ref 36.0–46.0)
Hemoglobin: 13.2 g/dL (ref 12.0–15.0)
Immature Granulocytes: 1 %
Lymphocytes Relative: 34 %
Lymphs Abs: 2.7 10*3/uL (ref 0.7–4.0)
MCH: 27.2 pg (ref 26.0–34.0)
MCHC: 30.3 g/dL (ref 30.0–36.0)
MCV: 89.7 fL (ref 80.0–100.0)
Monocytes Absolute: 0.5 10*3/uL (ref 0.1–1.0)
Monocytes Relative: 7 %
Neutro Abs: 4.7 10*3/uL (ref 1.7–7.7)
Neutrophils Relative %: 55 %
Platelets: 207 10*3/uL (ref 150–400)
RBC: 4.86 MIL/uL (ref 3.87–5.11)
RDW: 13.6 % (ref 11.5–15.5)
WBC: 8.2 10*3/uL (ref 4.0–10.5)
nRBC: 0 % (ref 0.0–0.2)

## 2018-03-23 LAB — COMPREHENSIVE METABOLIC PANEL
ALT: 17 U/L (ref 0–44)
AST: 32 U/L (ref 15–41)
Albumin: 3.4 g/dL — ABNORMAL LOW (ref 3.5–5.0)
Alkaline Phosphatase: 53 U/L (ref 38–126)
Anion gap: 7 (ref 5–15)
BUN: 5 mg/dL — ABNORMAL LOW (ref 6–20)
CO2: 28 mmol/L (ref 22–32)
Calcium: 8.6 mg/dL — ABNORMAL LOW (ref 8.9–10.3)
Chloride: 104 mmol/L (ref 98–111)
Creatinine, Ser: 0.74 mg/dL (ref 0.44–1.00)
GFR calc Af Amer: >60 mL/min (ref >60)
GFR calc non Af Amer: >60 mL/min (ref >60)
Glucose, Bld: 90 mg/dL (ref 70–99)
POTASSIUM: 4.4 mmol/L (ref 3.5–5.1)
Sodium: 139 mmol/L (ref 135–145)
Total Bilirubin: 0.7 mg/dL (ref 0.3–1.2)
Total Protein: 6 g/dL — ABNORMAL LOW (ref 6.5–8.1)

## 2018-03-23 LAB — RAPID URINE DRUG SCREEN, HOSP PERFORMED
Amphetamines: NOT DETECTED
Barbiturates: NOT DETECTED
Benzodiazepines: NOT DETECTED
Cocaine: NOT DETECTED
Opiates: NOT DETECTED
TETRAHYDROCANNABINOL: NOT DETECTED

## 2018-03-23 LAB — ETHANOL: Alcohol, Ethyl (B): <10 mg/dL (ref <10)

## 2018-03-23 LAB — I-STAT BETA HCG BLOOD, ED (MC, WL, AP ONLY): I-stat hCG, quantitative: <5 m[IU]/mL (ref <5)

## 2018-03-23 LAB — ACETAMINOPHEN LEVEL: Acetaminophen (Tylenol), Serum: <10 ug/mL — ABNORMAL LOW (ref 10–30)

## 2018-03-23 LAB — SALICYLATE LEVEL: Salicylate Lvl: <7 mg/dL (ref 2.8–30.0)

## 2018-03-23 MED ORDER — SUCRALFATE 1 GM/10ML PO SUSP: 1.0000 g | Freq: Three times a day (TID) | ORAL | Status: DC

## 2018-03-23 MED ORDER — OLANZAPINE 5 MG PO TABS
5.0000 mg | ORAL_TABLET | Freq: Two times a day (BID) | ORAL | Status: DC
  Administered 2018-03-23 – 2018-03-26 (×7): 5 mg via ORAL
  Filled 2018-03-23 (×7): qty 1

## 2018-03-23 MED ORDER — IBUPROFEN 800 MG PO TABS
800.0000 mg | ORAL_TABLET | Freq: Once | ORAL | Status: AC
  Administered 2018-03-23: 800 mg via ORAL
  Filled 2018-03-23: qty 1

## 2018-03-23 MED ORDER — DIPHENHYDRAMINE HCL 25 MG PO CAPS
25.0000 mg | ORAL_CAPSULE | Freq: Three times a day (TID) | ORAL | Status: DC | PRN
  Administered 2018-03-23 – 2018-03-25 (×4): 25 mg via ORAL
  Filled 2018-03-23 (×5): qty 1

## 2018-03-23 NOTE — SANE Note (Signed)
03/23/2018 1530 Spoke with nurse and staff in behavioral health area. Lurena Joiner, RN states that patient is "psychotic and manic and fixated on something. I don't think she'd be able to give you what you need." Per previous note, patient attempted to elope from ED and was escorted back to room by security and staff. I informed to page on call SANE if patient stabilizes and wants further SANE assistance.
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
## 2018-03-23 NOTE — ED Notes (Signed)
Belongings inventoried by Donato Schultz, RN - 2 labeled belongings bags - in [place] #3 - NO Valuables envelope noted.

## 2018-03-23 NOTE — ED Notes (Signed)
IVC papers served - copy faxed to [facility], copy sent to Medical Records, original placed in folder for Magistrate, and all 3 sets on clipboard.  

## 2018-03-23 NOTE — ED Notes (Signed)
Pt lying on bed - appears to be manic. Pt talking continuously. Pt calling staff by different names - refers to this RN as "Actuary as "[place]". Pt noted to become easily irritated.

## 2018-03-23 NOTE — ED Notes (Signed)
Pt lying on bed - continuously talking to herself.

## 2018-03-23 NOTE — SANE Note (Signed)
Approached nursing staff about patient's condition for potential SANE evaluation. Per report, she is manic and psychotic. She may not be able to participate. SANE evaluation deferred at this time. Please contact the SANE/FNE nurse on call (listed in Amion) with any further concerns.

## 2018-03-23 NOTE — ED Notes (Signed)
Dinner order placed 

## 2018-03-23 NOTE — ED Notes (Signed)
Pt asked about providing a urine sample. Pt stated that she did not have to go to the bathroom. A few minutes later, pt ambulated to the bathroom without assistance and stated that she had been calling twice to go to the bathroom. Unable to get a urine sample. Will try later.

## 2018-03-23 NOTE — ED Notes (Signed)
Received a call from [place] at [facility]. Requests to fax copy of IVC papers to [facility]. Copy faxed to [facility] and one to [facility].
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
## 2018-03-23 NOTE — Progress Notes (Signed)
Patient meets criteria for inpatient treatment. No appropriate or available beds at [facility]. CSW faxed referrals to the following facilities for review:  [facility], [facility], [place], [facility], [facility], [place] [facility], [place], [place], [place], and [place].  TTS will continue to seek bed placement.  Vilma Meckel. Algis Greenhouse, MSW, LCSW Clinical Social Work/Disposition Phone: [phone] Fax: [phone]

## 2018-03-23 NOTE — SANE Note (Signed)
The SANE/FNE Teacher, music(Forensic Nurse Examiner) has consulted with the patient. Due to psychiatric and medical needs, consult was not able to be completed. If time permits, I may return to talk with the patient further. Apolinar JunesBrandon, GeorgiaPA  and Trish Magenna RN have been notified. Please contact the SANE/FNE nurse on call (listed in Amion) with any further concerns or if patient verbalizes desire to see SANE.

## 2018-03-23 NOTE — ED Notes (Signed)
Patient denies pain and is resting comfortably.  

## 2018-03-23 NOTE — ED Notes (Signed)
Pt c/o staff are "too friendly" then states "no one is telling me what's going on". Advised pt of tx plan x 3. Pt had removed ID bracelet x 2 - then denies bracelet being applied. New ID bracelet applied to right ankle after verifying pt's name and DOB. Pt took Zyprexa after much encouragement.

## 2018-03-23 NOTE — ED Notes (Signed)
Pt took a shower at 8pm
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
## 2018-03-23 NOTE — ED Notes (Signed)
Sane assessment is completed.

## 2018-03-23 NOTE — ED Notes (Signed)
Pt became upset - attempted to leave ED - pt escorted back to room by staff and security. Pt given sandwich and coffee as requested.

## 2018-03-24 NOTE — ED Notes (Signed)
Pt is being very argument with staff about various things; pt asked to see her belongings that has been inventoried and placed in locker when she arrived two days ago; pt was shown inventory list and given opportunity to sign but pt refused to sigh;pt was advised that once property has been inventoried and placed in locker this RN would not go into belongings; pt advised she will get inventory list at d/c or upon tranfer to different facility belongings will be transported with her; pt sitting in room talking to self at this time-Monique,RN

## 2018-03-24 NOTE — ED Notes (Signed)
Regular dinner meal tray ordered 

## 2018-03-24 NOTE — ED Triage Notes (Signed)
PT non-complient  And removes Pt ID band.

## 2018-03-24 NOTE — ED Notes (Signed)
Pt is an IVC patient and sitter left at 0300. Charge RN aware.

## 2018-03-24 NOTE — ED Notes (Signed)
Pt meal never arrived, pt was given a [place] sandwich and coffee at her request

## 2018-03-24 NOTE — ED Notes (Signed)
Received call from [facility]. Pt is denied. Will let md and [facility] know.

## 2018-03-24 NOTE — ED Triage Notes (Signed)
New PT ID bend placed for PT. Pt removed the band multiple times after placement and after band was adjusted to Pt satisfaction ,then band was scaned for med admin.

## 2018-03-24 NOTE — ED Notes (Signed)
Pt requested to see her personal possessions, she was informed that her items could not be brought back to her but that the inventory list was available for her to check.  Upon seeing the list, she denied its accuracy, citing additional items that she brought to the ED.  This tech offered to recheck her possessions and make a new list for her to approve, pt declined offer, continuing to verbalize complaint after staff left the room.

## 2018-03-24 NOTE — ED Notes (Signed)
Pts bedding changed when menstrual blood noted on sheets while pt was in restroom.  Pt requests an update on her plan of care, states she feels she is only being "baby-sat"

## 2018-03-24 NOTE — ED Notes (Signed)
Breakfast tray ordered 

## 2018-03-25 NOTE — BH Assessment (Signed)
03/25/2018: Received a call from [employer] staff [phone]). Patient accepted to [facility] for admission 03/26/2018 (anytime after 7am). The accepting provider is Dr. Ellamae Sia. Nurse report # is [phone]. The bed # will be assigned upon patients arrival to the facility. Patient's nurse Gabriel Rung) @ [facility] updated with patient's disposition details.

## 2018-03-25 NOTE — ED Notes (Signed)
Pt noted to be anxious. Asking Sitter how much money she owes her and that she can get w/her payee d/t "I was staying out on the street".

## 2018-03-25 NOTE — ED Notes (Signed)
Pt noted to be labile - pt ambulatory to nurses' desk stating "I don't want that man watching me!" pointing toward female Sitter. Attempted to advise pt she is her Sitter and she has responsible to watch her. Pt appears to be argumentative. Encouraged pt to display appropriate behavior. Pt returned to her room.

## 2018-03-25 NOTE — BH Assessment (Addendum)
03/25/2018 Patient continues to meet criteria for INPT treatment. Information re-faxed to the following hospitals for review: [address], [address], [address], [place], [place]. [place], [place], [address], [place], [place], [address], [address] [place], [address], [address], [place], [place], [place], [facility], [facility], [place], [place], [place], [place], [place], and [facility]. [facility] has declined due to acuity.

## 2018-03-25 NOTE — ED Notes (Signed)
Pt c/o Sitter following her when she ambulates to bathroom.

## 2018-03-25 NOTE — ED Notes (Signed)
Regular Diet was ordered for Lunch. 

## 2018-03-25 NOTE — ED Notes (Signed)
Patient was given a snack and drink. 

## 2018-03-25 NOTE — ED Notes (Signed)
Breakfast tray ordered 

## 2018-03-25 NOTE — ED Notes (Signed)
Pt in shower.  

## 2018-03-26 NOTE — ED Notes (Signed)
Declined W/C at D/C and was escorted to lobby by RN. 

## 2018-03-26 NOTE — ED Notes (Signed)
Breakfast tray ordered 

## 2018-04-05 ENCOUNTER — Encounter (HOSPITAL_COMMUNITY): Payer: Self-pay | Admitting: Emergency Medicine

## 2018-04-05 ENCOUNTER — Emergency Department (HOSPITAL_COMMUNITY): Payer: Medicaid Other

## 2018-04-05 ENCOUNTER — Emergency Department (HOSPITAL_COMMUNITY)
Admission: EM | Admit: 2018-04-05 | Discharge: 2018-04-05 | Disposition: A | Discharge status: Other Acute Inpatient Hospital | Payer: Medicaid Other | Attending: Emergency Medicine | Admitting: Emergency Medicine

## 2018-04-05 DIAGNOSIS — J449 Chronic obstructive pulmonary disease, unspecified: Secondary | ICD-10-CM | POA: Diagnosis not present

## 2018-04-05 DIAGNOSIS — M79672 Pain in left foot: Secondary | ICD-10-CM | POA: Insufficient documentation

## 2018-04-05 DIAGNOSIS — F39 Unspecified mood [affective] disorder: Secondary | ICD-10-CM | POA: Insufficient documentation

## 2018-04-05 DIAGNOSIS — F17210 Nicotine dependence, cigarettes, uncomplicated: Secondary | ICD-10-CM | POA: Diagnosis not present

## 2018-04-05 DIAGNOSIS — I10 Essential (primary) hypertension: Secondary | ICD-10-CM | POA: Insufficient documentation

## 2018-04-05 DIAGNOSIS — M79671 Pain in right foot: Secondary | ICD-10-CM | POA: Diagnosis not present

## 2018-04-05 LAB — CBC
HCT: 43.2 % (ref 36.0–46.0)
Hemoglobin: 13.3 g/dL (ref 12.0–15.0)
MCH: 27.8 pg (ref 26.0–34.0)
MCHC: 30.8 g/dL (ref 30.0–36.0)
MCV: 90.2 fL (ref 80.0–100.0)
Platelets: 253 10*3/uL (ref 150–400)
RBC: 4.79 MIL/uL (ref 3.87–5.11)
RDW: 13.6 % (ref 11.5–15.5)
WBC: 10.9 10*3/uL — ABNORMAL HIGH (ref 4.0–10.5)
nRBC: 0 % (ref 0.0–0.2)

## 2018-04-05 LAB — RAPID URINE DRUG SCREEN, HOSP PERFORMED
Amphetamines: NOT DETECTED
Barbiturates: NOT DETECTED
Benzodiazepines: NOT DETECTED
Cocaine: NOT DETECTED
OPIATES: NOT DETECTED
Tetrahydrocannabinol: NOT DETECTED

## 2018-04-05 LAB — COMPREHENSIVE METABOLIC PANEL
ALT: 24 U/L (ref 0–44)
AST: 22 U/L (ref 15–41)
Albumin: 3.9 g/dL (ref 3.5–5.0)
Alkaline Phosphatase: 60 U/L (ref 38–126)
Anion gap: 8 (ref 5–15)
BUN: 19 mg/dL (ref 6–20)
CO2: 27 mmol/L (ref 22–32)
Calcium: 9.1 mg/dL (ref 8.9–10.3)
Chloride: 104 mmol/L (ref 98–111)
Creatinine, Ser: 0.68 mg/dL (ref 0.44–1.00)
GFR calc Af Amer: >60 mL/min (ref >60)
GFR calc non Af Amer: >60 mL/min (ref >60)
Glucose, Bld: 97 mg/dL (ref 70–99)
Potassium: 3.9 mmol/L (ref 3.5–5.1)
Sodium: 139 mmol/L (ref 135–145)
Total Bilirubin: 0.7 mg/dL (ref 0.3–1.2)
Total Protein: 6.9 g/dL (ref 6.5–8.1)

## 2018-04-05 LAB — I-STAT BETA HCG BLOOD, ED (MC, WL, AP ONLY): I-stat hCG, quantitative: <5 m[IU]/mL (ref <5)

## 2018-04-05 LAB — ETHANOL

## 2018-04-05 MED ORDER — ALBUTEROL SULFATE HFA 108 (90 BASE) MCG/ACT IN AERS: 1.0000 | INHALATION_SPRAY | Freq: Once | RESPIRATORY_TRACT | Status: DC

## 2018-04-05 MED ORDER — IPRATROPIUM-ALBUTEROL 0.5-2.5 (3) MG/3ML IN SOLN
3.0000 mL | Freq: Once | RESPIRATORY_TRACT | Status: AC
  Administered 2018-04-05: 3 mL via RESPIRATORY_TRACT
  Filled 2018-04-05: qty 3

## 2018-04-05 NOTE — ED Notes (Signed)
Bed: WA27 Expected date:  Expected time:  Means of arrival:  Comments: Medical clearance

## 2018-04-05 NOTE — ED Provider Notes (Signed)
[facility]-EMERGENCY DEPT Provider Note   CSN: [id number] Arrival date & time: 04/05/18  1452     History   Chief Complaint Chief Complaint  Patient presents with  . Medical Clearance    HPI Melissa Dickerson is a 48 y.o. female.  The history is provided by the patient and medical records. No language interpreter was used.   Melissa Dickerson is a 48 y.o. female who presents to the Emergency Department complaining of foot pain and sob. [place] of bilateral foot pain as well shortness of breath. Her foot pain has been present for "a long time". She states that she walks all the time she has been homeless and that both of her feet hurt when she stands on them. She feels like they need bandaged. She denies any fevers, nausea, vomiting, leg swelling. She also reports shortness of breath. She has a history of COPD and has been without her inhaler for the last two days. She denies any chest pain, hemoptysis. Symptoms are mild and constant nature. [facility] and has not yet filled her medications. She denies any SI, HI,  hallucinations. Past Medical History:  Diagnosis Date  . COPD (chronic obstructive pulmonary disease) (HCC)   . Schizoaffective disorder [facility])     Patient Active Problem List   Diagnosis Date Noted  . Tobacco abuse 12/10/2016  . GERD (gastroesophageal reflux disease) 12/10/2016  . HTN (hypertension) 12/05/2016  . COPD exacerbation (HCC) 12/04/2016  . Schizoaffective disorder, depressive type (HCC) 03/12/2016    Past Surgical History:  Procedure Laterality Date  . CHOLECYSTECTOMY       OB History   No obstetric history on file.      Home Medications    Prior to Admission medications   Not on File    Family History No family history on file.  Social History Social History   Tobacco Use  . Smoking status: Current Every Day Smoker    Packs/day: 0.15   Types: Cigarettes  . Smokeless tobacco: Never Used  Substance Use Topics  . Alcohol use: No  . Drug use: No    Comment: UDS NA at time of assessment     Allergies   Haldol [haloperidol]; Pork-derived products; and Prozac [fluoxetine hcl]   Review of Systems Review of Systems  All other systems reviewed and are negative.    Physical Exam Updated Vital Signs BP 136/78 (BP Location: Right Arm)   Pulse 99   Temp 98 F (36.7 C) (Oral)   Resp 19   LMP  (LMP Unknown)   SpO2 100%   Physical Exam Vitals signs and nursing note reviewed.  Constitutional:      Appearance: She is well-developed.  HENT:     Head: Normocephalic and atraumatic.  Cardiovascular:     Rate and Rhythm: Normal rate and regular rhythm.     Heart sounds: No murmur.  Pulmonary:     Effort: Pulmonary effort is normal. No respiratory distress.     Comments: End expiratory wheezes bilaterally Abdominal:     Palpations: Abdomen is soft.     Tenderness: There is no abdominal tenderness. There is no guarding or rebound.  Musculoskeletal:        General: No tenderness.     Comments: 2+ DP pulses bilaterally.  No significant lower extremity edema.  Small abrasions over the dorsal midfoot bilaterally.  No cellulitis or abscess.  Skin:    General: Skin is warm and dry.  Neurological:     Mental Status: She is alert and oriented to person, place, and time.  Psychiatric:        Behavior: Behavior normal.     Comments: Flat affect.       ED Treatments / Results  Labs (all labs ordered are listed, but only abnormal results are displayed) Labs Reviewed  CBC - Abnormal; Notable for the following components:      Result Value   WBC 10.9 (*)    All other components within normal limits  RAPID URINE DRUG SCREEN, HOSP PERFORMED  COMPREHENSIVE METABOLIC PANEL  ETHANOL  I-STAT BETA HCG BLOOD, ED ([facility], [facility], [facility] ONLY)    EKG None  Radiology No results found.  Procedures Procedures (including critical  care time)  Medications Ordered in ED Medications  ipratropium-albuterol (DUONEB) 0.5-2.5 (3) MG/3ML nebulizer solution 3 mL (has no administration in time range)     Initial Impression / Assessment and Plan / ED Course  I have reviewed the triage vital signs and the nursing notes.  Pertinent labs & imaging results that were available during my care of the patient were reviewed by me and considered in my medical decision making (see chart for details).     Patient presents to the emergency department for evaluation of foot pain and shortness of breath, out of her inhaler. She is non-toxic appearing on evaluation with no respiratory distress. She does have a few scattered wheezes, following albuterol treatment her wheezing resolved. Presentation is not consistent with COPD exacerbation, pneumonia, CHF, PE. Will provide inhaler. In terms of her foot pain, no evidence of acute infection, gout, no fracture on imaging. Discussed rest, elevating her feet, proper footwear. Patient does have a psychiatric history but at this time does not appear to be decompensated with no evidence of acute crisis. Plan to discharge home with outpatient resources and return precautions.  Final Clinical Impressions(s) / ED Diagnoses   Final diagnoses:  None    ED Discharge Orders    None       Tilden Fossa, MD 04/05/18 2053

## 2018-04-05 NOTE — ED Notes (Signed)
Pt denies any pain. Pt alert and cooperative. Pt denies si,hi, and avh at this time. Pt mildly confused. Pt resting in bed quietly. Pt safe, will continue to monitor.

## 2018-04-05 NOTE — ED Notes (Signed)
Patient calm, cooperative, and resting.
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
## 2018-04-05 NOTE — ED Notes (Signed)
Pt discharge instructions reviewed pt verbalizes an understanding. Pt stable. Pt reports she's homeless and has no where to go. Pt agreed to sleep in ed room until tomorrow morning.

## 2018-04-16 ENCOUNTER — Encounter: Payer: Self-pay | Admitting: Pediatric Intensive Care

## 2018-04-16 NOTE — Congregational Nurse Program (Signed)
  Dept: [phone]   Congregational Nurse Program Note  Date of Encounter: 04/16/2018  Past Medical History: Past Medical History:  Diagnosis Date  . COPD (chronic obstructive pulmonary disease) (HCC)   . Schizoaffective disorder (HCC)     Encounter Details: Client well known to this CN. She states that she "needs help for addiction". She states she has been smoking crack with a friend who also needs assistance. Client states that she was recently discharged from [facility] for a [facility] admission but substance use was not addressed. She states that her outpatient prescriptions were sent to [facility]. CN called Harlow Asa- there is nor record of prescriptions. Client states she has not had access to medication since she was at [facility] and she has been having difficulty breathing with lower extremity swelling. BBS- biphasic wheezes noted with poor air movement. Client has increased work of breathing. Pitting edema of lower extremities noted. CN advised walk in at [facility] clinic this afternoon for evaluation but client declined saying she was tired and needed to go to [employer]. Client stated she would return tomorrow so CN could assist her with going to [facility] for intake. CN advised client to return to ED if any increased work of breathing or shortness of breath. CN gave contact information with clinic hours. Shann Medal RN BSN CNP [phone]

## 2018-04-17 ENCOUNTER — Encounter: Payer: Self-pay | Admitting: Pediatric Intensive Care

## 2018-04-17 NOTE — Congregational Nurse Program (Signed)
  Dept: [phone]   Congregational Nurse Program Note  Date of Encounter: 04/17/2018  Past Medical History: Past Medical History:  Diagnosis Date  . COPD (chronic obstructive pulmonary disease) (HCC)   . Schizoaffective disorder [facility])     Encounter Details: CNP Questionnaire - 04/17/18 0900      Questionnaire   Patient Status  Not Applicable    Race  White or Caucasian    Location Patient Served At  [employer]    Uninsured  Not Applicable       Client given taxi voucher to go to [place]. Client will check in to be seen by [facility] provider there. Client states she will follow up with CN on Monday. CN will follow up with client by phone later today. CN advised client to go to ED if experiencing difficulty breathing however do not go to ED for medications only. CN advised client that she can be seen in walk in clinic at [facility] on Wednesday afternoon. Written instructions given to client. Shann Medal RN BSN CNP [phone]

## 2018-04-28 ENCOUNTER — Encounter: Payer: Self-pay | Admitting: Pediatric Intensive Care

## 2018-05-26 ENCOUNTER — Encounter (HOSPITAL_COMMUNITY): Payer: Self-pay | Admitting: Family Medicine

## 2018-05-26 ENCOUNTER — Emergency Department (HOSPITAL_COMMUNITY)
Admission: EM | Admit: 2018-05-26 | Discharge: 2018-05-26 | Disposition: A | Discharge status: Other Acute Inpatient Hospital | Payer: Medicaid Other | Attending: Emergency Medicine | Admitting: Emergency Medicine

## 2018-05-26 ENCOUNTER — Encounter (HOSPITAL_COMMUNITY): Payer: Self-pay

## 2018-05-26 ENCOUNTER — Inpatient Hospital Stay (HOSPITAL_COMMUNITY)
Admission: AD | Admit: 2018-05-26 | Discharge: 2018-05-30 | DRG: 885 | Disposition: A | Discharge status: Home / Self Care | Payer: Medicaid Other | Source: Intra-hospital | Attending: Psychiatry | Admitting: Psychiatry

## 2018-05-26 DIAGNOSIS — E250 Congenital adrenogenital disorders associated with enzyme deficiency: Secondary | ICD-10-CM | POA: Diagnosis present

## 2018-05-26 DIAGNOSIS — F333 Major depressive disorder, recurrent, severe with psychotic symptoms: Principal | ICD-10-CM | POA: Diagnosis present

## 2018-05-26 DIAGNOSIS — F1410 Cocaine abuse, uncomplicated: Secondary | ICD-10-CM | POA: Diagnosis present

## 2018-05-26 DIAGNOSIS — I10 Essential (primary) hypertension: Secondary | ICD-10-CM | POA: Insufficient documentation

## 2018-05-26 DIAGNOSIS — Z91018 Allergy to other foods: Secondary | ICD-10-CM | POA: Diagnosis not present

## 2018-05-26 DIAGNOSIS — F17210 Nicotine dependence, cigarettes, uncomplicated: Secondary | ICD-10-CM | POA: Diagnosis not present

## 2018-05-26 DIAGNOSIS — Z818 Family history of other mental and behavioral disorders: Secondary | ICD-10-CM

## 2018-05-26 DIAGNOSIS — F332 Major depressive disorder, recurrent severe without psychotic features: Secondary | ICD-10-CM | POA: Diagnosis present

## 2018-05-26 DIAGNOSIS — Z888 Allergy status to other drugs, medicaments and biological substances status: Secondary | ICD-10-CM

## 2018-05-26 DIAGNOSIS — R45851 Suicidal ideations: Secondary | ICD-10-CM | POA: Insufficient documentation

## 2018-05-26 DIAGNOSIS — J449 Chronic obstructive pulmonary disease, unspecified: Secondary | ICD-10-CM | POA: Diagnosis present

## 2018-05-26 DIAGNOSIS — F1490 Cocaine use, unspecified, uncomplicated: Secondary | ICD-10-CM | POA: Diagnosis not present

## 2018-05-26 DIAGNOSIS — F1010 Alcohol abuse, uncomplicated: Secondary | ICD-10-CM | POA: Diagnosis present

## 2018-05-26 DIAGNOSIS — R45850 Homicidal ideations: Secondary | ICD-10-CM | POA: Diagnosis present

## 2018-05-26 DIAGNOSIS — Z79899 Other long term (current) drug therapy: Secondary | ICD-10-CM | POA: Diagnosis not present

## 2018-05-26 DIAGNOSIS — G4700 Insomnia, unspecified: Secondary | ICD-10-CM | POA: Diagnosis present

## 2018-05-26 DIAGNOSIS — F251 Schizoaffective disorder, depressive type: Secondary | ICD-10-CM | POA: Diagnosis not present

## 2018-05-26 DIAGNOSIS — Z7289 Other problems related to lifestyle: Secondary | ICD-10-CM | POA: Diagnosis not present

## 2018-05-26 DIAGNOSIS — Z915 Personal history of self-harm: Secondary | ICD-10-CM

## 2018-05-26 DIAGNOSIS — F1424 Cocaine dependence with cocaine-induced mood disorder: Secondary | ICD-10-CM | POA: Diagnosis not present

## 2018-05-26 DIAGNOSIS — R440 Auditory hallucinations: Secondary | ICD-10-CM | POA: Diagnosis not present

## 2018-05-26 HISTORY — DX: Essential (primary) hypertension: I10

## 2018-05-26 LAB — COMPREHENSIVE METABOLIC PANEL
ALT: 15 U/L (ref 0–44)
AST: 16 U/L (ref 15–41)
Albumin: 3.9 g/dL (ref 3.5–5.0)
Alkaline Phosphatase: 73 U/L (ref 38–126)
Anion gap: 8 (ref 5–15)
BUN: 19 mg/dL (ref 6–20)
CO2: 27 mmol/L (ref 22–32)
CREATININE: 0.92 mg/dL (ref 0.44–1.00)
Calcium: 8.8 mg/dL — ABNORMAL LOW (ref 8.9–10.3)
Chloride: 104 mmol/L (ref 98–111)
GFR calc Af Amer: >60 mL/min (ref >60)
GFR calc non Af Amer: >60 mL/min (ref >60)
Glucose, Bld: 82 mg/dL (ref 70–99)
Potassium: 3.7 mmol/L (ref 3.5–5.1)
Sodium: 139 mmol/L (ref 135–145)
Total Bilirubin: 0.5 mg/dL (ref 0.3–1.2)
Total Protein: 6.9 g/dL (ref 6.5–8.1)

## 2018-05-26 LAB — CBC
HCT: 44 % (ref 36.0–46.0)
HEMOGLOBIN: 13.5 g/dL (ref 12.0–15.0)
MCH: 28.3 pg (ref 26.0–34.0)
MCHC: 30.7 g/dL (ref 30.0–36.0)
MCV: 92.2 fL (ref 80.0–100.0)
Platelets: 245 10*3/uL (ref 150–400)
RBC: 4.77 MIL/uL (ref 3.87–5.11)
RDW: 14.1 % (ref 11.5–15.5)
WBC: 9.2 10*3/uL (ref 4.0–10.5)
nRBC: 0 % (ref 0.0–0.2)

## 2018-05-26 LAB — ETHANOL: Alcohol, Ethyl (B): <10 mg/dL (ref <10)

## 2018-05-26 LAB — RAPID URINE DRUG SCREEN, HOSP PERFORMED
Amphetamines: NOT DETECTED
BENZODIAZEPINES: NOT DETECTED
Barbiturates: NOT DETECTED
Cocaine: POSITIVE — AB
Opiates: NOT DETECTED
Tetrahydrocannabinol: NOT DETECTED

## 2018-05-26 LAB — ACETAMINOPHEN LEVEL: Acetaminophen (Tylenol), Serum: <10 ug/mL — ABNORMAL LOW (ref 10–30)

## 2018-05-26 LAB — I-STAT BETA HCG BLOOD, ED (MC, WL, AP ONLY): I-stat hCG, quantitative: <5 m[IU]/mL (ref <5)

## 2018-05-26 LAB — SALICYLATE LEVEL: Salicylate Lvl: <7 mg/dL (ref 2.8–30.0)

## 2018-05-26 MED ORDER — DIVALPROEX SODIUM 500 MG PO DR TAB
750.0000 mg | DELAYED_RELEASE_TABLET | Freq: Every day | ORAL | Status: DC
  Administered 2018-05-26: 750 mg via ORAL
  Filled 2018-05-26: qty 1

## 2018-05-26 MED ORDER — HYDROCHLOROTHIAZIDE 12.5 MG PO CAPS
12.5000 mg | ORAL_CAPSULE | Freq: Every day | ORAL | Status: DC
  Administered 2018-05-27 – 2018-05-30 (×4): 12.5 mg via ORAL
  Filled 2018-05-26 (×5): qty 1

## 2018-05-26 MED ORDER — HYDROCHLOROTHIAZIDE 12.5 MG PO CAPS
12.5000 mg | ORAL_CAPSULE | Freq: Every day | ORAL | Status: DC
  Administered 2018-05-26: 12.5 mg via ORAL
  Filled 2018-05-26 (×2): qty 1

## 2018-05-26 MED ORDER — LISINOPRIL 10 MG PO TABS
10.0000 mg | ORAL_TABLET | Freq: Every day | ORAL | Status: DC
  Administered 2018-05-27 – 2018-05-30 (×4): 10 mg via ORAL
  Filled 2018-05-26 (×5): qty 1

## 2018-05-26 MED ORDER — ARIPIPRAZOLE 5 MG PO TABS
5.0000 mg | ORAL_TABLET | Freq: Every day | ORAL | Status: DC
  Administered 2018-05-27 – 2018-05-30 (×4): 5 mg via ORAL
  Filled 2018-05-26 (×6): qty 1

## 2018-05-26 MED ORDER — ALUM & MAG HYDROXIDE-SIMETH 200-200-20 MG/5ML PO SUSP: 30.0000 mL | Freq: Four times a day (QID) | ORAL | Status: DC | PRN

## 2018-05-26 MED ORDER — BENZTROPINE MESYLATE 0.5 MG PO TABS
0.5000 mg | ORAL_TABLET | Freq: Every day | ORAL | Status: DC
  Administered 2018-05-27: 0.5 mg via ORAL
  Filled 2018-05-26 (×3): qty 1

## 2018-05-26 MED ORDER — ONDANSETRON HCL 4 MG PO TABS: 4.0000 mg | ORAL_TABLET | Freq: Three times a day (TID) | ORAL | Status: DC | PRN

## 2018-05-26 MED ORDER — MAGNESIUM HYDROXIDE 400 MG/5ML PO SUSP: 30.0000 mL | Freq: Every day | ORAL | Status: DC | PRN

## 2018-05-26 MED ORDER — LISINOPRIL 10 MG PO TABS
10.0000 mg | ORAL_TABLET | Freq: Every day | ORAL | Status: DC
  Administered 2018-05-26: 10 mg via ORAL
  Filled 2018-05-26: qty 1

## 2018-05-26 MED ORDER — DIVALPROEX SODIUM 500 MG PO DR TAB
750.0000 mg | DELAYED_RELEASE_TABLET | Freq: Every day | ORAL | Status: DC
  Filled 2018-05-26 (×2): qty 1

## 2018-05-26 MED ORDER — ARIPIPRAZOLE 5 MG PO TABS
5.0000 mg | ORAL_TABLET | Freq: Every day | ORAL | Status: DC
  Administered 2018-05-26: 5 mg via ORAL
  Filled 2018-05-26: qty 1

## 2018-05-26 MED ORDER — ACETAMINOPHEN 325 MG PO TABS
650.0000 mg | ORAL_TABLET | Freq: Four times a day (QID) | ORAL | Status: DC | PRN
  Administered 2018-05-29: 20:00:00 650 mg via ORAL
  Filled 2018-05-26: qty 2

## 2018-05-26 MED ORDER — ALUM & MAG HYDROXIDE-SIMETH 200-200-20 MG/5ML PO SUSP: 30.0000 mL | ORAL | Status: DC | PRN

## 2018-05-26 MED ORDER — BENZTROPINE MESYLATE 0.5 MG PO TABS
0.5000 mg | ORAL_TABLET | Freq: Every day | ORAL | Status: DC
  Administered 2018-05-26: 0.5 mg via ORAL
  Filled 2018-05-26: qty 1

## 2018-05-26 NOTE — ED Provider Notes (Signed)
Care transferred to me while waiting for laboratory testing for medical clearance.  Patient is being seen by psychiatry currently and anticipate they will recommend inpatient management for psychiatric problems.  Patient's laboratory testing was overall reassuring. Cocaine was positive.   Patient is medically cleared for further psychiatric management at this time.      Tegeler, Canary Brim, MD 05/26/18 [phone]

## 2018-05-26 NOTE — ED Notes (Signed)
Pt A&O x 3, no distress noted, calm & cooperative.  Monitoring for safety,  Pending bed assignment at [facility], report & [facility] transport.

## 2018-05-26 NOTE — ED Notes (Signed)
Pt arrived on unit calm and cooperative. Pt oriented to unit. Pt denies SI at this time. No complaints noted.  Pt given sandwich and drink.

## 2018-05-26 NOTE — ED Notes (Signed)
Bed: [facility]3 Expected date:  Expected time:  Means of arrival:  Comments: 

## 2018-05-26 NOTE — BH Assessment (Signed)
[facility] Assessment Progress Note  Per Nanine Means, DNP, this pt requires psychiatric hospitalization at this time.  Berneice Heinrich, RN, [facility] has agreed to accept pt to an unspecified bed, pending medical clearance.  Pt has signed Voluntary Admission and Consent for Treatment, as well as Consent to Release Information to [facility] [place], and a notification call has been placed.  Signed forms have been faxed to [facility].  Pt's nurse, Diane, has been notified, and agrees to send original paperwork along with pt via [facility]row, and to call report to [phone].  Doylene Canning, [place] [facility] Coordinator [phone]

## 2018-05-26 NOTE — ED Provider Notes (Signed)
  Physical Exam  BP 134/90   Pulse 69   Temp 97.8 F (36.6 C)   Resp 16   Ht 5\' 6"  (1.676 m)   Wt 113.4 kg   LMP 05/19/2018   SpO2 97%   BMI 40.35 kg/m   Physical Exam  ED Course/Procedures     Procedures  MDM  Patient is been accepted at behavioral health.       Benjiman Core, MD 05/26/18 2018

## 2018-05-26 NOTE — ED Notes (Signed)
Bed: OIB70 Expected date:  Expected time:  Means of arrival:  Comments: Hold for triage 3
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
## 2018-05-26 NOTE — ED Notes (Signed)
Report called to RN Kirby, [facility], [address].  Pending [facility] transport.

## 2018-05-26 NOTE — ED Triage Notes (Signed)
Patient states she walked into today at [facility] [place] for assistance. Patient states last night she did crack with boyfriend last night. This morning, patient states she got up, had a few altercating words with her boyfriend. Pt states she feels suicidal and homicidal. Patient voiced she informed her boyfriend that she would get a knife and cut him half, and she would push him off the porch. Also, states she is hearing voices, stating she is not worth nothing and wasting her life. Patient has been having visions of getting a knife to cutting her wrist open, going in front of a train or semi-truck.

## 2018-05-26 NOTE — ED Triage Notes (Signed)
Patient was at [facility] [place] for therapy appointment and picked by [employer]. According to officers, patient is unable to contract for safety due to suicidal thoughts. Officers states patient is schizoaffective and been off medications for a while and has been smoking crack. Patient had a generic statement of SI but no direct plan was voiced to police officer.

## 2018-05-26 NOTE — BH Assessment (Signed)
[facility] Assessment Progress Note Case was staffed with Lord DNP who recommended a inpatient admission to assist with stabilization.       

## 2018-05-26 NOTE — ED Provider Notes (Signed)
[facility]-EMERGENCY DEPT Provider Note   CSN: [id number] Arrival date & time: 05/26/18  1354    History   Chief Complaint Chief Complaint  Patient presents with  . Psychiatric Evaluation    HPI Melissa Dickerson is a 48 y.o. female.     The history is provided by the patient and medical records. No language interpreter was used.   Melissa Dickerson is a 48 y.o. female who presents to the Emergency Department complaining of psychiatric evaluation. She presents to the emergency department voluntarily by police for suicidal ideation. She reports a long-standing psychiatric history of schizoaffective disorder. She is been staying in a boardinghouse recently and has been off her medications for the last four months. She reports using crack cocaine and experiencing increasing depression and suicidal ideation. She states that she is having difficulty with her boyfriend and they had an argument last night. She stated that she could cut him in half. She also reports passive suicidal ideation with no clear plan. She does also complain of auditory hallucinations. She denies any fevers, chest pain, cough, shortness of breath, nausea, vomiting. Past Medical History:  Diagnosis Date  . COPD (chronic obstructive pulmonary disease) (HCC)   . Schizoaffective disorder [facility])     Patient Active Problem List   Diagnosis Date Noted  . Tobacco abuse 12/10/2016  . GERD (gastroesophageal reflux disease) 12/10/2016  . HTN (hypertension) 12/05/2016  . COPD exacerbation (HCC) 12/04/2016  . Schizoaffective disorder, depressive type (HCC) 03/12/2016    Past Surgical History:  Procedure Laterality Date  . CHOLECYSTECTOMY       OB History   No obstetric history on file.      Home Medications    Prior to Admission medications   Not on File    Family History History reviewed. No pertinent family history.  Social History Social History   Tobacco Use  . Smoking status: Current  Every Day Smoker    Packs/day: 0.50    Types: Cigarettes  . Smokeless tobacco: Never Used  Substance Use Topics  . Alcohol use: Yes    Comment: 3-4 times. Last drink: Last night   . Drug use: Yes    Comment: Crack: Last used last night      Allergies   Haldol [haloperidol]; Pork-derived products; and Prozac [fluoxetine hcl]   Review of Systems Review of Systems  All other systems reviewed and are negative.    Physical Exam Updated Vital Signs BP (!) 153/93 (BP Location: Left Arm)   Pulse 70   Temp 98.7 F (37.1 C) (Oral)   Resp 18   Ht 5\' 6"  (1.676 m)   Wt 113.4 kg   LMP 05/19/2018   SpO2 96%   BMI 40.35 kg/m   Physical Exam Vitals signs and nursing note reviewed.  Constitutional:      Appearance: She is well-developed.  HENT:     Head: Normocephalic and atraumatic.  Cardiovascular:     Rate and Rhythm: Normal rate and regular rhythm.  Pulmonary:     Effort: Pulmonary effort is normal. No respiratory distress.  Musculoskeletal: Normal range of motion.  Skin:    General: Skin is warm.  Neurological:     Mental Status: She is alert and oriented to person, place, and time.  Psychiatric:     Comments: Anxious appearing with tangential thought process. Suicidal thoughts.      ED Treatments / Results  Labs (all labs ordered are listed, but only abnormal results are displayed) Labs  Reviewed  RAPID URINE DRUG SCREEN, HOSP PERFORMED - Abnormal; Notable for the following components:      Result Value   Cocaine POSITIVE (*)    All other components within normal limits  COMPREHENSIVE METABOLIC PANEL  ETHANOL  SALICYLATE LEVEL  ACETAMINOPHEN LEVEL  CBC  I-STAT BETA HCG BLOOD, ED ([facility], [facility], [facility] ONLY)  I-STAT BETA HCG BLOOD, ED ([facility], [facility], [facility] ONLY)    EKG None  Radiology No results found.  Procedures Procedures (including critical care time)  Medications Ordered in ED Medications  ondansetron (ZOFRAN) tablet 4 mg (has no administration in time range)   alum & mag hydroxide-simeth (MAALOX/MYLANTA) 200-200-20 MG/5ML suspension 30 mL (has no administration in time range)  divalproex (DEPAKOTE) DR tablet 750 mg (has no administration in time range)  ARIPiprazole (ABILIFY) tablet 5 mg (has no administration in time range)  benztropine (COGENTIN) tablet 0.5 mg (has no administration in time range)  lisinopril (PRINIVIL,ZESTRIL) tablet 10 mg (has no administration in time range)  hydrochlorothiazide (MICROZIDE) capsule 12.5 mg (has no administration in time range)     Initial Impression / Assessment and Plan / ED Course  I have reviewed the triage vital signs and the nursing notes.  Pertinent labs & imaging results that were available during my care of the patient were reviewed by me and considered in my medical decision making (see chart for details).       patient with history of schizoaffective disorder here for evaluation of the side, HI. She is calm on examination. Patient care transferred pending labs and psychiatry assessment. Final Clinical Impressions(s) / ED Diagnoses   Final diagnoses:  None    ED Discharge Orders    None       Tilden Fossa, MD 05/26/18 [id number]

## 2018-05-27 ENCOUNTER — Other Ambulatory Visit: Payer: Self-pay

## 2018-05-27 DIAGNOSIS — F1490 Cocaine use, unspecified, uncomplicated: Secondary | ICD-10-CM

## 2018-05-27 DIAGNOSIS — F251 Schizoaffective disorder, depressive type: Secondary | ICD-10-CM

## 2018-05-27 MED ORDER — DIVALPROEX SODIUM 500 MG PO DR TAB
500.0000 mg | DELAYED_RELEASE_TABLET | Freq: Every day | ORAL | Status: DC
  Administered 2018-05-27 – 2018-05-29 (×3): 500 mg via ORAL
  Filled 2018-05-27 (×4): qty 1

## 2018-05-27 MED ORDER — ADULT MULTIVITAMIN W/MINERALS CH
1.0000 | ORAL_TABLET | Freq: Every day | ORAL | Status: DC
  Administered 2018-05-27 – 2018-05-30 (×4): 1 via ORAL
  Filled 2018-05-27 (×6): qty 1

## 2018-05-27 MED ORDER — LOPERAMIDE HCL 2 MG PO CAPS: 2.0000 mg | ORAL_CAPSULE | ORAL | Status: DC | PRN

## 2018-05-27 MED ORDER — VITAMIN B-1 100 MG PO TABS
100.0000 mg | ORAL_TABLET | Freq: Every day | ORAL | Status: DC
  Administered 2018-05-28 – 2018-05-30 (×3): 100 mg via ORAL
  Filled 2018-05-27 (×4): qty 1

## 2018-05-27 MED ORDER — HYDROXYZINE HCL 25 MG PO TABS
25.0000 mg | ORAL_TABLET | Freq: Four times a day (QID) | ORAL | Status: DC | PRN
  Administered 2018-05-28 (×2): 25 mg via ORAL
  Filled 2018-05-27 (×3): qty 1

## 2018-05-27 MED ORDER — LORAZEPAM 1 MG PO TABS: 1.0000 mg | ORAL_TABLET | Freq: Four times a day (QID) | ORAL | Status: DC | PRN

## 2018-05-27 MED ORDER — THIAMINE HCL 100 MG/ML IJ SOLN
100.0000 mg | Freq: Once | INTRAMUSCULAR | Status: AC
  Administered 2018-05-27: 100 mg via INTRAMUSCULAR
  Filled 2018-05-27: qty 2

## 2018-05-27 NOTE — [facility] Suicide Risk Assessment (Signed)
CSW attempted to reach patient's contact provided Doristine Locks at [phone], to discuss patient's safety, SPE and to inquire about any collateral information. CSW left a message for Darl Pikes to call assigned CSW back at [phone].

## 2018-05-27 NOTE — Progress Notes (Signed)
D. Pt presents with an anxious affect/mood- calm, cooperative behavior, voices  many concerns about various stressors . Per pt's self inventory, pt rates her depression, hopelessness and anxiety an 7/7/8, respectively. Pt writes that her most important goal to work on today is "to get or set a routine for my daily things to do to stay focused" Pt states that she "dwells on the past too much" and "I am serious about my help I need and hope to see or experience a change in me. Pt endorses passive SI, stating, "some thoughts, but not bad like yesterday". Pt  agrees to contact staff before acting on any harmful thoughts.  A. Labs and vitals monitored. Pt compliant with medications. Pt supported emotionally and encouraged to express concerns and ask questions.   R. Pt remains safe with 15 minute checks. Will continue POC.

## 2018-05-27 NOTE — Progress Notes (Signed)
Patient discussed in group that she has been hospitalized on numerous occasions and that she is working on talking to her peers for support. She also stated that she does not feel alone in terms of dealing with her issues and is comfortable with her peers. Her goal for tomorrow is to begin discussing what her discharge options are.
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
## 2018-05-27 NOTE — Progress Notes (Signed)
D: Pt  passive SI/ AH- getting better. denies HI/VH. Pt is pleasant and cooperative. Pt visible in dayroom doing a puzzle, pt endorsed her feeling like she did the right thing by coming here.   A: Pt was offered support and encouragement. Pt was given scheduled medications. Pt was encourage to attend groups. Q 15 minute checks were done for safety.   R:Pt attends groups and interacts well with peers and staff. Pt is taking medication. Pt has no complaints.Pt receptive to treatment and safety maintained on unit.  Problem: Education: Goal: Emotional status will improve Outcome: Progressing   Problem: Education: Goal: Mental status will improve Outcome: Progressing   Problem: Activity: Goal: Sleeping patterns will improve Outcome: Progressing

## 2018-05-27 NOTE — Tx Team (Signed)
Initial Treatment Plan 05/27/2018 1:08 AM Melissa Dickerson NTZ:[id number]    PATIENT STRESSORS: Medication change or noncompliance Substance abuse   PATIENT STRENGTHS: General fund of knowledge Motivation for treatment/growth Supportive family/friends   PATIENT IDENTIFIED PROBLEMS: Risk for suicide  depression   SA  "get off drugs, get back on medications, get a therapist"               DISCHARGE CRITERIA:  Improved stabilization in mood, thinking, and/or behavior Verbal commitment to aftercare and medication compliance  PRELIMINARY DISCHARGE PLAN: Attend aftercare/continuing care group Attend PHP/IOP Outpatient therapy  PATIENT/FAMILY INVOLVEMENT: This treatment plan has been presented to and reviewed with the patient, Melissa Dickerson.  The patient and family have been given the opportunity to ask questions and make suggestions.  Delos Haring, RN 05/27/2018, 1:08 AM
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
## 2018-05-28 DIAGNOSIS — F17210 Nicotine dependence, cigarettes, uncomplicated: Secondary | ICD-10-CM

## 2018-05-28 DIAGNOSIS — Z7289 Other problems related to lifestyle: Secondary | ICD-10-CM

## 2018-05-28 DIAGNOSIS — R440 Auditory hallucinations: Secondary | ICD-10-CM

## 2018-05-28 DIAGNOSIS — F1410 Cocaine abuse, uncomplicated: Secondary | ICD-10-CM

## 2018-05-28 DIAGNOSIS — F332 Major depressive disorder, recurrent severe without psychotic features: Secondary | ICD-10-CM

## 2018-05-28 LAB — LIPID PANEL
CHOLESTEROL: 122 mg/dL (ref 0–200)
HDL: 41 mg/dL (ref >40)
LDL Cholesterol: 68 mg/dL (ref 0–99)
Total CHOL/HDL Ratio: 3 RATIO
Triglycerides: 63 mg/dL (ref <150)
VLDL: 13 mg/dL (ref 0–40)

## 2018-05-28 LAB — HEMOGLOBIN A1C
Hgb A1c MFr Bld: 5.1 % (ref 4.8–5.6)
Mean Plasma Glucose: 99.67 mg/dL

## 2018-05-28 LAB — TSH: TSH: 1.271 u[IU]/mL (ref 0.350–4.500)

## 2018-05-28 NOTE — Tx Team (Signed)
Interdisciplinary Treatment and Diagnostic Plan Update  05/28/2018 Time of Session: 10:00am Valyn Annand MRN: [id number]  Principal Diagnosis: <principal problem not specified>  Secondary Diagnoses: Active Problems:   Major depressive disorder, recurrent severe without psychotic features (HCC)   Current Medications:  Current Facility-Administered Medications  Medication Dose Route Frequency Provider Last Rate Last Dose  . acetaminophen (TYLENOL) tablet 650 mg  650 mg Oral Q6H PRN Charm Rings, NP      . alum & mag hydroxide-simeth (MAALOX/MYLANTA) 200-200-20 MG/5ML suspension 30 mL  30 mL Oral Q4H PRN Charm Rings, NP      . ARIPiprazole (ABILIFY) tablet 5 mg  5 mg Oral Daily Charm Rings, NP   5 mg at 05/28/18 0751  . divalproex (DEPAKOTE) DR tablet 500 mg  500 mg Oral QHS Cobos, Rockey Situ, MD   500 mg at 05/27/18 2144  . hydrochlorothiazide (MICROZIDE) capsule 12.5 mg  12.5 mg Oral Daily Charm Rings, NP   12.5 mg at 05/28/18 0751  . hydrOXYzine (ATARAX/VISTARIL) tablet 25 mg  25 mg Oral Q6H PRN Cobos, Rockey Situ, MD   25 mg at 05/28/18 0753  . lisinopril (PRINIVIL,ZESTRIL) tablet 10 mg  10 mg Oral Daily Charm Rings, NP   10 mg at 05/28/18 0751  . loperamide (IMODIUM) capsule 2-4 mg  2-4 mg Oral PRN Cobos, Rockey Situ, MD      . LORazepam (ATIVAN) tablet 1 mg  1 mg Oral Q6H PRN Cobos, Fernando A, MD      . magnesium hydroxide (MILK OF MAGNESIA) suspension 30 mL  30 mL Oral Daily PRN Charm Rings, NP      . multivitamin with minerals tablet 1 tablet  1 tablet Oral Daily Cobos, Rockey Situ, MD   1 tablet at 05/28/18 0751  . ondansetron (ZOFRAN) tablet 4 mg  4 mg Oral Q8H PRN Charm Rings, NP      . thiamine (VITAMIN B-1) tablet 100 mg  100 mg Oral Daily Cobos, Rockey Situ, MD   100 mg at 05/28/18 0751   PTA Medications: Medications Prior to Admission  Medication Sig Dispense Refill Last Dose  . ARIPiprazole (ABILIFY) 10 MG tablet Take 10 mg by mouth at bedtime.    unknown  . benztropine (COGENTIN) 1 MG tablet Take 1 mg by mouth 2 (two) times daily.   unknown  . divalproex (DEPAKOTE ER) 500 MG 24 hr tablet Take 500 mg by mouth 2 (two) times daily.   unknown  . lisinopril-hydrochlorothiazide (PRINZIDE,ZESTORETIC) 20-25 MG tablet Take 1 tablet by mouth daily.   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   Physician Treatment Plan for Primary Diagnosis: <principal problem not specified> Long Term Goal(s): Improvement in symptoms so as ready for discharge Improvement in symptoms so as ready for discharge   Short Term Goals: Ability to identify changes in lifestyle to reduce recurrence of condition will improve Ability to verbalize feelings will improve Ability to disclose and discuss suicidal ideas Ability to demonstrate self-control will improve Ability to identify and develop effective coping behaviors will improve Ability to maintain clinical measurements within normal limits will improve Ability to identify changes in lifestyle to reduce recurrence of condition will improve Ability to identify triggers associated with substance abuse/mental health issues will improve  Medication Management: Evaluate patient's response, side effects, and tolerance of medication regimen.  Therapeutic Interventions: 1 to 1 sessions, Unit Group sessions and Medication administration.  Evaluation of Outcomes: Progressing  Physician Treatment Plan for Secondary Diagnosis: Active Problems:   Major depressive disorder, recurrent severe without psychotic features (HCC)  Long Term Goal(s): Improvement in symptoms so as ready for discharge Improvement in symptoms so as ready for  discharge   Short Term Goals: Ability to identify changes in lifestyle to reduce recurrence of condition will improve Ability to verbalize feelings will improve Ability to disclose and discuss suicidal ideas Ability to demonstrate self-control will improve Ability to identify and develop effective coping behaviors will improve Ability to maintain clinical measurements within normal limits will improve Ability to identify changes in lifestyle to reduce recurrence of condition will improve Ability to identify triggers associated with substance abuse/mental health issues will improve     Medication Management: Evaluate patient's response, side effects, and tolerance of medication regimen.  Therapeutic Interventions: 1 to 1 sessions, Unit Group sessions and Medication administration.  Evaluation of Outcomes: Progressing   RN Treatment Plan for Primary Diagnosis: <principal problem not specified> Long Term Goal(s): Knowledge of disease and therapeutic regimen to maintain health will improve  Short Term Goals: Ability to participate in decision making will improve, Ability to identify and develop effective coping behaviors will improve and Compliance with prescribed medications will improve  Medication Management: RN will administer medications as ordered by provider, will assess and evaluate patient's response and provide education to patient for prescribed medication. RN will report any adverse and/or side effects to prescribing provider.  Therapeutic Interventions: 1 on 1 counseling sessions, Psychoeducation, Medication administration, Evaluate responses to treatment, Monitor vital signs and CBGs as ordered, Perform/monitor CIWA, COWS, AIMS and Fall Risk screenings as ordered, Perform wound care treatments as ordered.  Evaluation of Outcomes: Progressing   LCSW Treatment Plan for Primary Diagnosis: <principal problem not specified> Long Term Goal(s): Safe transition to appropriate next  level of care at discharge, Engage patient in therapeutic group addressing interpersonal concerns.  Short Term Goals: Engage patient in aftercare planning with referrals and resources, Increase social support, Increase emotional regulation, Identify triggers associated with mental health/substance abuse issues and Increase skills for wellness and recovery  Therapeutic Interventions: Assess for all discharge needs, 1 to 1 time with Social worker, Explore available resources and support systems, Assess for adequacy in community support network, Educate family and significant other(s) on suicide prevention, Complete Psychosocial Assessment, Interpersonal group therapy.  Evaluation of Outcomes: Progressing   Progress in Treatment: Attending groups: Yes. Participating in groups: Yes. Taking medication as prescribed: Yes. Toleration medication: Yes. Family/Significant other contact made: Yes, individual(s) contacted:  one attempt to reach patient's payee, will make another attempt Patient understands diagnosis: Yes. Discussing patient identified problems/goals with staff: Yes. Medical problems stabilized or resolved: Yes. Denies suicidal/homicidal ideation: No. Endorsing SI and HI toward boyfriend upon admission. Issues/concerns per patient self-inventory: No.  New problem(s) identified: Yes, Describe:  limited income, limited supports  New Short Term/Long Term Goal(s): detox, medication management for mood stabilization; elimination of SI thoughts; development of comprehensive mental wellness/sobriety plan.  Patient Goals:  "get off drugs, get back on medications, get a therapist"  Discharge Plan or Barriers: Likely discharging back home with roommate and following up with an outpatient provider. CSW continuing to assess for appropriate referrals.   Reason for Continuation of Hospitalization: Anxiety Depression Homicidal ideation Suicidal ideation  Estimated Length of Stay: 3-5  days  Attendees: Patient: 05/28/2018 12:21 PM  Physician:  05/28/2018 12:21 PM  Nursing:  05/28/2018 12:21 PM  RN Care Manager: 05/28/2018 12:21 PM  Social Worker: Adria Devon 05/28/2018 12:21 PM  Recreational Therapist:  05/28/2018 12:21 PM  Other:  05/28/2018 12:21 PM  Other:  05/28/2018 12:21 PM  Other: 05/28/2018 12:21 PM    Scribe for Treatment Team: Darreld Mclean, LCSWA 05/28/2018 12:21 PM

## 2018-05-28 NOTE — Progress Notes (Signed)
D:  Patient's self inventory sheet, patient sleeps good, no sleep medication.  Good appetite, low energy level, poor concentration.  Rated depression 6-7, hopeless 4-5,  Anxiety 7-8.  Denied withdrawals.  Denied SI.  Denied physical problems.  Denied physical pain.  Goal is think on positive things and develop a routine to follow after discharge. Plans to think and meditate.  "Everyone has been very nice and helpful"'   Does have discharge plans.  "I need to learn to get myself to do the things I need to do." A:  Medications administered per MD orders.  Emotional support and encouragement given patient. R:  Denied SI and HI, contracts for safety.  Denied A/V hallucinations.  Safety maintained with 15 minute checks.

## 2018-05-28 NOTE — Progress Notes (Signed)
[facility] MD Progress Note  05/28/2018 3:03 PM Melissa Dickerson  MRN:  [id number] Subjective:  Patient reports she is feeling better today. States auditory hallucinations, which she had endorsed on admission, have " not been bothering me ". Denies current medication side effects. Objective : I have discussed case with treatment team and have met with patient. 48 year old female, presented to hospital voluntarily due to depression, neurovegetative symptoms of depression, vague auditory hallucinations, suicidal thoughts with thoughts of cutting herself with a knife. Also reported homicidal ideations towards boyfriend following an argument with him.  Describes heavy/regular alcohol consumption as well as daily cocaine abuse over recent days. She describes a prior diagnoses of schizoaffective disorder with a history of good response to Abilify/Depakote combination.  Has been off her medications for several months. Patient reports she is feeling " a lot better" today, and currently describes improving mood, no current or active auditory hallucinations, and does not appear internally preoccupied. Affect presents improved . States " I just needed to get back on my meds". Denies side effects thus far . Currently denies suicidal ideations, and presents future oriented at this time.  No symptoms of alcohol WDL at this time- no tremors, no diaphoresis, no psychomotor restlessness, vitals stable .  Behavior on unit in good control, cooperative on approach.  Labs reviewed- Lipid panel unremarkable, Hgb A1C 5.1, TSH 1.271     Principal Problem: Depression, auditory hallucinations, cocaine abuse Diagnosis: Active Problems:   Major depressive disorder, recurrent severe without psychotic features ([place])  Total Time spent with patient: 20 minutes  Past Psychiatric History:   Past Medical History:  Past Medical History:  Diagnosis Date  . COPD (chronic obstructive pulmonary disease) ([place])   . Hypertension   .  Schizoaffective disorder [facility])     Past Surgical History:  Procedure Laterality Date  . CHOLECYSTECTOMY     Family History: History reviewed. No pertinent family history. Family Psychiatric  History:  Social History:  Social History   Substance and Sexual Activity  Alcohol Use Yes   Comment: 3-4 times. Last drink: Last night      Social History   Substance and Sexual Activity  Drug Use Yes  . Types: "Crack" cocaine   Comment: Crack: Last used last night     Social History   Socioeconomic History  . Marital status: Divorced    Spouse name: Not on file  . Number of children: Not on file  . Years of education: Not on file  . Highest education level: Not on file  Occupational History  . Not on file  Social Needs  . Financial resource strain: Not on file  . Food insecurity:    Worry: Not on file    Inability: Not on file  . Transportation needs:    Medical: Not on file    Non-medical: Not on file  Tobacco Use  . Smoking status: Current Every Day Smoker    Packs/day: 0.50    Years: 33.00    Pack years: 16.50    Types: Cigarettes  . Smokeless tobacco: Never Used  Substance and Sexual Activity  . Alcohol use: Yes    Comment: 3-4 times. Last drink: Last night   . Drug use: Yes    Types: "Crack" cocaine    Comment: Crack: Last used last night   . Sexual activity: Not Currently  Lifestyle  . Physical activity:    Days per week: Not on file    Minutes per session: Not on file  .  Stress: Not on file  Relationships  . Social connections:    Talks on phone: Not on file    Gets together: Not on file    Attends religious service: Not on file    Active member of club or organization: Not on file    Attends meetings of clubs or organizations: Not on file    Relationship status: Not on file  Other Topics Concern  . Not on file  Social History Narrative   Pt lives at a group home; receives outpatient services from [facility]   Additional Social History:   Sleep:  Good  Appetite:  Good  Current Medications: Current Facility-Administered Medications  Medication Dose Route Frequency Provider Last Rate Last Dose  . acetaminophen (TYLENOL) tablet 650 mg  650 mg Oral Q6H PRN Patrecia Pour, NP      . alum & mag hydroxide-simeth (MAALOX/MYLANTA) 200-200-20 MG/5ML suspension 30 mL  30 mL Oral Q4H PRN Patrecia Pour, NP      . ARIPiprazole (ABILIFY) tablet 5 mg  5 mg Oral Daily Patrecia Pour, NP   5 mg at 05/28/18 0751  . divalproex (DEPAKOTE) DR tablet 500 mg  500 mg Oral QHS Cobos, Myer Peer, MD   500 mg at 05/27/18 2144  . hydrochlorothiazide (MICROZIDE) capsule 12.5 mg  12.5 mg Oral Daily Patrecia Pour, NP   12.5 mg at 05/28/18 0751  . hydrOXYzine (ATARAX/VISTARIL) tablet 25 mg  25 mg Oral Q6H PRN Cobos, Myer Peer, MD   25 mg at 05/28/18 0753  . lisinopril (PRINIVIL,ZESTRIL) tablet 10 mg  10 mg Oral Daily Patrecia Pour, NP   10 mg at 05/28/18 0751  . loperamide (IMODIUM) capsule 2-4 mg  2-4 mg Oral PRN Cobos, Myer Peer, MD      . LORazepam (ATIVAN) tablet 1 mg  1 mg Oral Q6H PRN Cobos, Fernando A, MD      . magnesium hydroxide (MILK OF MAGNESIA) suspension 30 mL  30 mL Oral Daily PRN Patrecia Pour, NP      . multivitamin with minerals tablet 1 tablet  1 tablet Oral Daily Cobos, Myer Peer, MD   1 tablet at 05/28/18 0751  . ondansetron (ZOFRAN) tablet 4 mg  4 mg Oral Q8H PRN Patrecia Pour, NP      . thiamine (VITAMIN B-1) tablet 100 mg  100 mg Oral Daily Cobos, Myer Peer, MD   100 mg at 05/28/18 7793    Lab Results:  Results for orders placed or performed during the hospital encounter of 05/26/18 (from the past 48 hour(s))  Hemoglobin A1c     Status: None   Collection Time: 05/28/18  6:20 AM  Result Value Ref Range   Hgb A1c MFr Bld 5.1 4.8 - 5.6 %    Comment: (NOTE) Pre diabetes:          5.7%-6.4% Diabetes:              >6.4% Glycemic control for   <7.0% adults with diabetes    Mean Plasma Glucose 99.67 mg/dL    Comment:  Performed at [facility], [place] [address] [place], [place] [address]  Lipid panel     Status: None   Collection Time: 05/28/18  6:20 AM  Result Value Ref Range   Cholesterol 122 0 - 200 mg/dL   Triglycerides 63 <150 mg/dL   HDL 41 >40 mg/dL   Total CHOL/HDL Ratio 3.0 RATIO   VLDL 13 0 -  40 mg/dL   LDL Cholesterol 68 0 - 99 mg/dL    Comment:        Total Cholesterol/HDL:CHD Risk Coronary Heart Disease Risk Table                     Men   Women  1/2 Average Risk   3.4   3.3  Average Risk       5.0   4.4  2 X Average Risk   9.6   7.1  3 X Average Risk  23.4   11.0        Use the calculated Patient Ratio above and the CHD Risk Table to determine the patient's CHD Risk.        ATP III CLASSIFICATION (LDL):  <100     mg/dL   Optimal  100-129  mg/dL   Near or Above                    Optimal  130-159  mg/dL   Borderline  160-189  mg/dL   High  >190     mg/dL   Very High Performed at [facility] [address], [place], [place] [address]   TSH     Status: None   Collection Time: 05/28/18  6:20 AM  Result Value Ref Range   TSH 1.271 0.350 - 4.500 uIU/mL    Comment: Performed by a 3rd Generation assay with a functional sensitivity of <=0.01 uIU/mL. Performed at [facility], [place] [address] [place], [place] [address]     Blood Alcohol level:  Lab Results  Component Value Date   [facility] <10 05/26/2018   ETH <10 93/81/0175    Metabolic Disorder Labs: Lab Results  Component Value Date   HGBA1C 5.1 05/28/2018   MPG 99.67 05/28/2018   No results found for: PROLACTIN Lab Results  Component Value Date   CHOL 122 05/28/2018   TRIG 63 05/28/2018   HDL 41 05/28/2018   CHOLHDL 3.0 05/28/2018   VLDL 13 05/28/2018   LDLCALC 68 05/28/2018    Physical Findings: AIMS: Facial and Oral Movements Muscles of Facial Expression: None, normal Lips and Perioral Area: None, normal Jaw: None, normal Tongue: None, normal,Extremity  Movements Upper (arms, wrists, hands, fingers): None, normal Lower (legs, knees, ankles, toes): None, normal, Trunk Movements Neck, shoulders, hips: None, normal, Overall Severity Severity of abnormal movements (highest score from questions above): None, normal Incapacitation due to abnormal movements: None, normal Patient's awareness of abnormal movements (rate only patient's report): No Awareness, Dental Status Current problems with teeth and/or dentures?: No Does patient usually wear dentures?: No  CIWA:  CIWA-Ar Total: 1 COWS:  COWS Total Score: 1  Musculoskeletal: Strength & Muscle Tone: within normal limits no tremors, no diaphoresis, no psychomotor agitation Gait & Station: normal Patient leans: N/A  Psychiatric Specialty Exam: Physical Exam  ROS denies headache, no visual disturbances, no chest pain, no shortness of breath, no coughing, does endorse intermittent wheezing related to history of COPD, no vomiting, no nausea, no rash, no fever, no chills  Blood pressure 121/75, pulse 63, temperature (!) 97.3 F (36.3 C), temperature source Oral, resp. rate 20, height 5' 6" (1.676 m), weight 116.1 kg, last menstrual period 05/19/2018.Body mass index is 41.32 kg/m.  General Appearance: improving grooming   Eye Contact:  Fair, improves as session progresses   Speech:  Normal Rate  Volume:  Normal  Mood:  improving, reports she is feeling  better today  Affect:  becoming more reactive, smiles at times appropriately during session  Thought Process:  Linear and Descriptions of Associations: Intact  Orientation:  Other:  fully alert and attentive  Thought Content:  reports auditory hallucinations have improved , and states " have not heard any today"  Suicidal Thoughts:  No currently denies suicidal or self injurious ideations, also denies any homicidal or violent ideations and specifically denies any homicidal or violent ideations towards boyfriend   Homicidal Thoughts:  No  Memory:   recent and remote grossly intact   Judgement:  Other:  fair- improving   Insight:  Fair  Psychomotor Activity:  Normal- no psychomotor agitation or restlessness   Concentration:  Concentration: improving  and Attention Span: improving   Recall:  Good  Fund of Knowledge:  Good  Language:  Good  Akathisia:  Negative  Handed:  Right  AIMS (if indicated):     Assets:  Communication Skills Desire for Improvement Resilience  ADL's:  Intact  Cognition:  WNL  Sleep:  Number of Hours: 4.75   Assessment -  48 year old female, presented to hospital voluntarily due to depression, neurovegetative symptoms of depression, vague auditory hallucinations, suicidal thoughts with thoughts of cutting herself with a knife. Also reported homicidal ideations towards boyfriend following an argument with him.  Describes heavy/regular alcohol consumption as well as daily cocaine abuse over recent days. Treatment Plan Summary: Daily contact with patient to assess and evaluate symptoms and progress in treatment, Medication management, Plan inpatient treatment  and medications as below Encourage group and milieu participation to work on coping skills and symptom reduction  Encourage efforts to work on sobriety, abstinence Increase  Depakote ER to 750 mgrs QDAY for mood disorder  Continue Abilify 5 mgrs QDAY for mood disorder, psychosis Continue Ativan PRN for alcohol WDL symptoms if needed , as per CIWA score  Continue Lisinopril/HCTZ for HTN Treatment team working on disposition planning options   Jenne Campus, MD 05/28/2018, 3:03 PM

## 2018-05-28 NOTE — Plan of Care (Signed)
Nurse discussed anxiety, depression and coping skills with patient.  

## 2018-05-28 NOTE — Progress Notes (Signed)
Recreation Therapy Notes  Date:  4.1.20 Time: 0930 Location: [address] Dayroom  Group Topic: Stress Management  Goal Area(s) Addresses:  Patient will identify positive stress management techniques. Patient will identify benefits of using stress management post d/c.  Behavioral Response:  Engaged  Intervention: Stress Management  Activity :  Meditation.  LRT played a meditation that focused on letting go.  Patients were to listen as the meditation as it played to engage in the activity.  Education:  Stress Management, Discharge Planning.   Education Outcome: Acknowledges Education  Clinical Observations/Feedback:  Pt attended and participated in group.    Caroll Rancher, LRT/CTRS         Caroll Rancher A 05/28/2018 10:43 AM

## 2018-05-28 NOTE — Progress Notes (Signed)
Adult Psychoeducational Group Note  Date:  05/28/2018 Time:  11:35 AM  Group Topic/Focus:  Identifying Needs:   The focus of this group is to help patients identify their personal needs that have been historically problematic and identify healthy behaviors to address their needs.  Participation Level:  Active  Participation Quality:  Appropriate  Affect:  Appropriate  Cognitive:  Alert  Insight: Appropriate  Engagement in Group:  Engaged  Modes of Intervention:  Discussion  Additional Comments:  Pt attended group and participated in discussion.  Lakesa Coste R Marvia Troost 05/28/2018, 11:35 AM

## 2018-05-28 NOTE — [facility] Suicide Risk Assessment (Signed)
[facility] INPATIENT:  Family/Significant Other Suicide Prevention Education  Suicide Prevention Education:  Family/Significant Other Refusal to Support Patient after Discharge:  Suicide Prevention Education Not Provided:  Patient has identified home of family/significant other as the place the patient will be residing after discharge.  With written consent of the patient, two attempts were made to provide Suicide Prevention Education to Melissa Dickerson, payee [phone].  This person indicates he/she will not be responsible for the patient after discharge.   Payee explains that she is only a financial contact and she is not terribly familiar with the patient or her situation, she explains her relationship with the patient is exclusively financial/professional.  Darreld Mclean 05/28/2018,12:39 PM

## 2018-05-29 DIAGNOSIS — F1424 Cocaine dependence with cocaine-induced mood disorder: Secondary | ICD-10-CM

## 2018-05-29 LAB — VITAMIN D 25 HYDROXY (VIT D DEFICIENCY, FRACTURES): Vit D, 25-Hydroxy: 25.8 ng/mL — ABNORMAL LOW (ref 30.0–100.0)

## 2018-05-29 MED ORDER — CHOLECALCIFEROL 10 MCG (400 UNIT) PO TABS
400.0000 [IU] | ORAL_TABLET | Freq: Every day | ORAL | Status: DC
  Administered 2018-05-30: 08:00:00 400 [IU] via ORAL
  Filled 2018-05-29 (×4): qty 1

## 2018-05-29 NOTE — Progress Notes (Signed)
LCSW Group Therapy Notes 05/29/2018 1:58 PM  Type of Therapy and Topic: Group Therapy: Overcoming Obstacles  Participation Level: Active  Description of Group:  In this group patients will be encouraged to explore what they see as obstacles to their own wellness and recovery. They will be guided to discuss their thoughts, feelings, and behaviors related to these obstacles. The group will process together ways to cope with barriers, with attention given to specific choices patients can make. Each patient will be challenged to identify changes they are motivated to make in order to overcome their obstacles. This group will be process-oriented, with patients participating in exploration of their own experiences as well as giving and receiving support and challenge from other group members.  Therapeutic Goals: 1. Patient will identify personal and current obstacles as they relate to admission. 2. Patient will identify barriers that currently interfere with their wellness or overcoming obstacles.  3. Patient will identify feelings, thought process and behaviors related to these barriers. 4. Patient will identify two changes they are willing to make to overcome these obstacles:   Summary of Patient Progress Patient actively and appropriately participated in group. Patient shared the primary obstacles she faces are lack of community support and limited resources (no phone, no computer, limited income). Patient shares that she feels she would be able to more consistently attend her appointments if she had access to a phone. Patient has been diligent in speaking with CSW and other staff members about obtaining referrals for resources and shares that she hopes she can start working part time to meet her needs.  Therapeutic Modalities:  Cognitive Behavioral Therapy Solution Focused Therapy Motivational Interviewing Relapse Prevention Therapy  Enid Cutter, MSW, [facility] 05/29/2018 1:58 PM

## 2018-05-29 NOTE — Plan of Care (Addendum)
Patient was pleasant upon approach. Denied SI HI AVH. Patient is hopeful to leave tomorrow. Denies any physical pain. No further questions for the doctor or social worker. Safety is maintained with 15 minute checks as well as environmental checks. Will continue to monitor and assess.  Problem: Education: Goal: Emotional status will improve Outcome: Progressing Goal: Mental status will improve Outcome: Progressing Goal: Verbalization of understanding the information provided will improve Outcome: Progressing

## 2018-05-29 NOTE — Progress Notes (Signed)
D: Pt was in the dayroom upon initial approach.  Pt presents with appropriate affect and mood.  She reports her day has "been pretty good."  Her goal is "just keeping my mind on positive things."  Pt denies SI/HI, denies hallucinations, reports generalized pain of 6/10.  Pt has been visible in milieu interacting with peers and staff appropriately.   A: Introduced self to pt.  Met with pt 1:1.  Actively listened to pt and offered support and encouragement.  Medication administered per order.  PRN medication administered for pain.  Q15 minute safety checks maintained.  R: Pt is safe on the unit.  Pt is compliant with medication.  Pt verbally contracts for safety.  Will continue to monitor and assess.

## 2018-05-29 NOTE — Progress Notes (Signed)
D: Pt passive SI / AH- better, denies HI/VH. Pt is pleasant and cooperative. Pt stated she was feeling better , getting her thoughts together.   A: Pt was offered support and encouragement. Pt was given scheduled medications. Pt was encourage to attend groups. Q 15 minute checks were done for safety.   R:Pt attends groups and interacts well with peers and staff. Pt is taking medication. Pt has no complaints.Pt receptive to treatment and safety maintained on unit.   Problem: Education: Goal: Emotional status will improve Outcome: Progressing   Problem: Education: Goal: Mental status will improve Outcome: Progressing

## 2018-05-29 NOTE — Progress Notes (Signed)
Patient was interested in being established with an ACT Team, but she does not meet criteria. Patient referred to [facility] for Transitional Care Team services (TCT). Patient completed a phone screening with Angelique Blonder and CSW faxed consent forms. Patient wishes to follow up with them at discharge, expected discharge date 05/30/2018.  Enid Cutter, LCSW-A Clinical Social Worker

## 2018-05-29 NOTE — Progress Notes (Addendum)
[facility] MD Progress Note  05/29/2018 2:23 PM Terissa Haffey  MRN:  [id number] Subjective: Patient reports further improvement compared to admission.  Today denies suicidal ideations.  As she improves she is presenting future oriented, states she is interested in working part-time after she is discharged, if possible.  Denies medication side effects.  Objective : I have discussed case with treatment team and have met with patient. 48 year old female, presented to hospital voluntarily due to depression, neurovegetative symptoms of depression, vague auditory hallucinations, suicidal thoughts with thoughts of cutting herself with a knife. Also reported homicidal ideations towards boyfriend following an argument with him.  Describes heavy/regular alcohol consumption as well as daily cocaine abuse over recent days. She describes a prior diagnoses of schizoaffective disorder with a history of good response to Abilify/Depakote combination.  Has been off her medications for several months.  Patient presents with improving mood and range of affect compared to admission.  Today smiles and overall presents with a brighter affect.  States she is feeling better and as she improves she is presenting future oriented, and has expressed interest in being referred to an ACT team, although as reviewed with CSW she does not meet criteria for said referral. She denies suicidal ideations at this time. She has been visible on unit, pleasant on approach.  No disruptive or agitated behaviors.  Tolerating medications well.  Labs reviewed-lipid profile unremarkable, serum glucose 99.6, hemoglobin A1c 5.1, TSH 1.271.  Vitamin D serum level is low at 25.8 Principal Problem: Depression, auditory hallucinations, cocaine abuse Diagnosis: Active Problems:   Major depressive disorder, recurrent severe without psychotic features ([place])  Total Time spent with patient: 20 minutes  Past Psychiatric History:   Past Medical History:  Past  Medical History:  Diagnosis Date  . COPD (chronic obstructive pulmonary disease) ([place])   . Hypertension   . Schizoaffective disorder [facility])     Past Surgical History:  Procedure Laterality Date  . CHOLECYSTECTOMY     Family History: History reviewed. No pertinent family history. Family Psychiatric  History:  Social History:  Social History   Substance and Sexual Activity  Alcohol Use Yes   Comment: 3-4 times. Last drink: Last night      Social History   Substance and Sexual Activity  Drug Use Yes  . Types: "Crack" cocaine   Comment: Crack: Last used last night     Social History   Socioeconomic History  . Marital status: Divorced    Spouse name: Not on file  . Number of children: Not on file  . Years of education: Not on file  . Highest education level: Not on file  Occupational History  . Not on file  Social Needs  . Financial resource strain: Not on file  . Food insecurity:    Worry: Not on file    Inability: Not on file  . Transportation needs:    Medical: Not on file    Non-medical: Not on file  Tobacco Use  . Smoking status: Current Every Day Smoker    Packs/day: 0.50    Years: 33.00    Pack years: 16.50    Types: Cigarettes  . Smokeless tobacco: Never Used  Substance and Sexual Activity  . Alcohol use: Yes    Comment: 3-4 times. Last drink: Last night   . Drug use: Yes    Types: "Crack" cocaine    Comment: Crack: Last used last night   . Sexual activity: Not Currently  Lifestyle  . Physical activity:  Days per week: Not on file    Minutes per session: Not on file  . Stress: Not on file  Relationships  . Social connections:    Talks on phone: Not on file    Gets together: Not on file    Attends religious service: Not on file    Active member of club or organization: Not on file    Attends meetings of clubs or organizations: Not on file    Relationship status: Not on file  Other Topics Concern  . Not on file  Social History Narrative   Pt  lives at a group home; receives outpatient services from [place] [facility]   Additional Social History:   Sleep: Good  Appetite:  Good  Current Medications: Current Facility-Administered Medications  Medication Dose Route Frequency Provider Last Rate Last Dose  . acetaminophen (TYLENOL) tablet 650 mg  650 mg Oral Q6H PRN Patrecia Pour, NP      . alum & mag hydroxide-simeth (MAALOX/MYLANTA) 200-200-20 MG/5ML suspension 30 mL  30 mL Oral Q4H PRN Patrecia Pour, NP      . ARIPiprazole (ABILIFY) tablet 5 mg  5 mg Oral Daily Patrecia Pour, NP   5 mg at 05/29/18 0816  . divalproex (DEPAKOTE) DR tablet 500 mg  500 mg Oral QHS Cobos, Myer Peer, MD   500 mg at 05/28/18 2137  . hydrochlorothiazide (MICROZIDE) capsule 12.5 mg  12.5 mg Oral Daily Patrecia Pour, NP   12.5 mg at 05/29/18 0816  . hydrOXYzine (ATARAX/VISTARIL) tablet 25 mg  25 mg Oral Q6H PRN Cobos, Myer Peer, MD   25 mg at 05/28/18 2137  . lisinopril (PRINIVIL,ZESTRIL) tablet 10 mg  10 mg Oral Daily Patrecia Pour, NP   10 mg at 05/29/18 0816  . loperamide (IMODIUM) capsule 2-4 mg  2-4 mg Oral PRN Cobos, Myer Peer, MD      . LORazepam (ATIVAN) tablet 1 mg  1 mg Oral Q6H PRN Cobos, Fernando A, MD      . magnesium hydroxide (MILK OF MAGNESIA) suspension 30 mL  30 mL Oral Daily PRN Patrecia Pour, NP      . multivitamin with minerals tablet 1 tablet  1 tablet Oral Daily Cobos, Myer Peer, MD   1 tablet at 05/29/18 0816  . ondansetron (ZOFRAN) tablet 4 mg  4 mg Oral Q8H PRN Patrecia Pour, NP      . thiamine (VITAMIN B-1) tablet 100 mg  100 mg Oral Daily Cobos, Myer Peer, MD   100 mg at 05/29/18 4782    Lab Results:  Results for orders placed or performed during the hospital encounter of 05/26/18 (from the past 48 hour(s))  Hemoglobin A1c     Status: None   Collection Time: 05/28/18  6:20 AM  Result Value Ref Range   Hgb A1c MFr Bld 5.1 4.8 - 5.6 %    Comment: (NOTE) Pre diabetes:          5.7%-6.4% Diabetes:               >6.4% Glycemic control for   <7.0% adults with diabetes    Mean Plasma Glucose 99.67 mg/dL    Comment: Performed at [facility], [place] [address] [place], [place] [address]  Lipid panel     Status: None   Collection Time: 05/28/18  6:20 AM  Result Value Ref Range   Cholesterol 122 0 - 200 mg/dL   Triglycerides 63 <150 mg/dL  HDL 41 >40 mg/dL   Total CHOL/HDL Ratio 3.0 RATIO   VLDL 13 0 - 40 mg/dL   LDL Cholesterol 68 0 - 99 mg/dL    Comment:        Total Cholesterol/HDL:CHD Risk Coronary Heart Disease Risk Table                     Men   Women  1/2 Average Risk   3.4   3.3  Average Risk       5.0   4.4  2 X Average Risk   9.6   7.1  3 X Average Risk  23.4   11.0        Use the calculated Patient Ratio above and the CHD Risk Table to determine the patient's CHD Risk.        ATP III CLASSIFICATION (LDL):  <100     mg/dL   Optimal  100-129  mg/dL   Near or Above                    Optimal  130-159  mg/dL   Borderline  160-189  mg/dL   High  >190     mg/dL   Very High Performed at [facility] [address] [place], [place] [address]   VITAMIN D 25 Hydroxy (Vit-D Deficiency, Fractures)     Status: Abnormal   Collection Time: 05/28/18  6:20 AM  Result Value Ref Range   Vit D, 25-Hydroxy 25.8 (L) 30.0 - 100.0 ng/mL    Comment: (NOTE) Vitamin D deficiency has been defined by the [place] practice guideline as a level of serum 25-OH vitamin D less than 20 ng/mL (1,2). The Endocrine Society went on to further define vitamin D insufficiency as a level between 21 and 29 ng/mL (2). 1. IOM (Institute of Medicine). 2010. Dietary reference   intakes for calcium and D. [place]: The   [employer]. 2. Holick MF, Binkley Ney, Bischoff-Ferrari HA, et al.   Evaluation, treatment, and prevention of vitamin D   deficiency: an Endocrine Society clinical practice   guideline. JCEM. 2011 Jul;  96(7):1911-30. Performed At: [facility] [place], [place] [id number] Rush Farmer MD GE:[id number]   TSH     Status: None   Collection Time: 05/28/18  6:20 AM  Result Value Ref Range   TSH 1.271 0.350 - 4.500 uIU/mL    Comment: Performed by a 3rd Generation assay with a functional sensitivity of <=0.01 uIU/mL. Performed at [facility], [place] [address] [place], [place] [address]     Blood Alcohol level:  Lab Results  Component Value Date   [facility] <10 05/26/2018   ETH <10 46/56/8127    Metabolic Disorder Labs: Lab Results  Component Value Date   HGBA1C 5.1 05/28/2018   MPG 99.67 05/28/2018   No results found for: PROLACTIN Lab Results  Component Value Date   CHOL 122 05/28/2018   TRIG 63 05/28/2018   HDL 41 05/28/2018   CHOLHDL 3.0 05/28/2018   VLDL 13 05/28/2018   LDLCALC 68 05/28/2018    Physical Findings: AIMS: Facial and Oral Movements Muscles of Facial Expression: None, normal Lips and Perioral Area: None, normal Jaw: None, normal Tongue: None, normal,Extremity Movements Upper (arms, wrists, hands, fingers): None, normal Lower (legs, knees, ankles, toes): None, normal, Trunk Movements Neck, shoulders, hips: None, normal, Overall Severity Severity of abnormal movements (highest score  from questions above): None, normal Incapacitation due to abnormal movements: None, normal Patient's awareness of abnormal movements (rate only patient's report): No Awareness, Dental Status Current problems with teeth and/or dentures?: No Does patient usually wear dentures?: No  CIWA:  CIWA-Ar Total: 0 COWS:  COWS Total Score: 1  Musculoskeletal: Strength & Muscle Tone: within normal limits no tremors, no diaphoresis, no psychomotor agitation Gait & Station: normal Patient leans: N/A  Psychiatric Specialty Exam: Physical Exam  ROS no chest pain, no shortness of breath, does report intermittent wheezing related to history of  smoking/COPD.  No fever, no chills  Blood pressure 124/85, pulse 70, temperature 97.9 F (36.6 C), temperature source Oral, resp. rate 20, height _0  (1.676 m), weight 116.1 kg, last menstrual period 05/19/2018.Body mass index is 41.32 kg/m.  General Appearance: improving grooming   Eye Contact:  Good  Speech:  Normal Rate  Volume:  Normal  Mood:  Improved/presents euthymic today  Affect:  Toy Cookey in range  Thought Process:  Linear and Descriptions of Associations: Intact  Orientation:  Other:  fully alert and attentive  Thought Content:  No current hallucinations or delusions expressed, does not appear internally preoccupied  Suicidal Thoughts:  No  denies suicidal or self injurious ideations, also denies any homicidal or violent ideations and specifically denies any homicidal or violent ideations towards boyfriend   Homicidal Thoughts:  No  Memory:  recent and remote grossly intact   Judgement:  Other:  Improving  Insight:  Fair/improving  Psychomotor Activity:  Normal- no psychomotor agitation or restlessness   Concentration:  Concentration: improving  and Attention Span: improving   Recall:  Good  Fund of Knowledge:  Good  Language:  Good  Akathisia:  Negative  Handed:  Right  AIMS (if indicated):     Assets:  Communication Skills Desire for Improvement Resilience  ADL's:  Intact  Cognition:  WNL  Sleep:  Number of Hours: 6.75   Assessment -  48 year old female, presented to hospital voluntarily due to depression, neurovegetative symptoms of depression, vague auditory hallucinations, suicidal thoughts with thoughts of cutting herself with a knife. Also reported homicidal ideations towards boyfriend following an argument with him.  Describes heavy/regular alcohol consumption as well as daily cocaine abuse over recent days.  Today patient presents with further improvement.  At this time presents with much improved mood/fuller range of affect.  Denies suicidal ideations at this  time.  Continues to deny any homicidal or violent ideations towards boyfriend or anybody else.  Currently presents future oriented.  Thus far tolerating medications well (Abilify, Depakote).  She is not currently presenting with significant symptoms of withdrawal.  Her blood pressure is currently stable (today 124/85) on lisinopril/HCTZ.  Treatment Plan Summary: Daily contact with patient to assess and evaluate symptoms and progress in treatment, Medication management, Plan inpatient treatment  and medications as below  Treatment plan reviewed as below today 4/2 Encourage group and milieu participation to work on coping skills and symptom reduction  Encourage efforts to work on sobriety, abstinence Continue Depakote ER to 750 mgrs QDAY for mood disorder  Continue Abilify 5 mgrs QDAY for mood disorder, psychosis Continue Ativan PRN for alcohol WDL symptoms if needed , as per CIWA score  Continue Lisinopril/HCTZ for HTN Treatment team working on disposition planning options   Check valproic acid serum level in a.m. Jenne Campus, MD 05/29/2018, 2:23 PM   Patient ID: Darnelle Maffucci, female   DOB: 1970/07/24, 47 y.o.   MRN: [id number]

## 2018-05-30 LAB — VALPROIC ACID LEVEL: Valproic Acid Lvl: 34 ug/mL — ABNORMAL LOW (ref 50.0–100.0)

## 2018-05-30 LAB — METHYLMALONIC ACID, SERUM: METHYLMALONIC ACID, QUANTITATIVE: 192 nmol/L (ref 0–378)

## 2018-05-30 MED ORDER — LISINOPRIL-HYDROCHLOROTHIAZIDE 20-25 MG PO TABS: 1.0000 | ORAL_TABLET | Freq: Every day | ORAL | 1 refills | Status: DC

## 2018-05-30 MED ORDER — ADULT MULTIVITAMIN W/MINERALS CH: 1.0000 | ORAL_TABLET | Freq: Every day | ORAL | 1 refills | Status: AC

## 2018-05-30 MED ORDER — ARIPIPRAZOLE 5 MG PO TABS: 5.0000 mg | ORAL_TABLET | Freq: Every day | ORAL | 1 refills | Status: DC

## 2018-05-30 MED ORDER — DIVALPROEX SODIUM 500 MG PO DR TAB: 500.0000 mg | DELAYED_RELEASE_TABLET | Freq: Every day | ORAL | 1 refills | Status: DC

## 2018-05-30 NOTE — Progress Notes (Signed)
Adult Psychoeducational Group Note  Date:  05/30/2018 Time:  4:50 AM  Group Topic/Focus:  Wrap-Up Group:   The focus of this group is to help patients review their daily goal of treatment and discuss progress on daily workbooks.  Participation Level:  Active  Participation Quality:  Appropriate  Affect:  Appropriate  Cognitive:  Appropriate  Insight: Appropriate  Engagement in Group:  Engaged  Modes of Intervention:  Discussion  Additional Comments:  Pt said her day was a 9. The one good thing that happen she had good thoughts she was able to finish huge puzzle.  Melissa Dickerson 05/30/2018, 4:50 AM

## 2018-05-30 NOTE — Progress Notes (Signed)
Recreation Therapy Notes  Date:  4.3.20 Time: 0930 Location: [address] Dayroom  Group Topic: Stress Management  Goal Area(s) Addresses:  Patient will identify positive stress management techniques. Patient will identify benefits of using stress management post d/c.  Behavioral Response:  Engaged  Intervention: Stress Management  Activity :  Guided Imagery.  LRT read a script that guided patients in envisioning sitting in a field and enjoying summer clouds.  Patients were to follow along as script was read to participate in meditation.  Education:  Stress Management, Discharge Planning.   Education Outcome: Acknowledges Education  Clinical Observations/Feedback:  Pt attended and participated in group.     Caroll Rancher, LRT/CTRS     Caroll Rancher A 05/30/2018 11:44 AM

## 2018-05-30 NOTE — Plan of Care (Signed)
  Problem: Education: Goal: Emotional status will improve Outcome: Adequate for Discharge   Problem: Education: Goal: Mental status will improve Outcome: Adequate for Discharge   Problem: Education: Goal: Verbalization of understanding the information provided will improve Outcome: Adequate for Discharge   Problem: Activity: Goal: Interest or engagement in activities will improve Outcome: Adequate for Discharge   

## 2018-05-30 NOTE — Progress Notes (Signed)
Discharge note: Patient reviewed discharge paperwork with RN including prescriptions, follow up appointments, and lab work. Patient given the opportunity to ask questions. All concerns were addressed. All belongings were returned to patient. Denied SI/HI/AVH. Patient thanked staff for their care while at the hospital.  Patient was discharged to lobby. 

## 2018-05-30 NOTE — Discharge Summary (Signed)
Physician Discharge Summary Note  Patient:  Melissa Dickerson is an 48 y.o., female MRN:  [id number] DOB:  09/08/1970 Patient phone:  [phone] (home)  Patient address:   [address] [place] [address],  Total Time spent with patient: 45 minutes  Date of Admission:  05/26/2018 Date of Discharge: 05/30/18  Reason for Admission:   History of Present Illness: Patient is a 48 year old female, presented to ED voluntarily via GPS , reporting depression and suicidal ideations, with thoughts of cutting herself with a knife . Reports long history of depression which she states tend to be worse during winter months. She reports she has been off her psychiatric medications x 4 -6 months .  Of note, in addition to SI, reports she has had recent homicidal ideations towards her boyfriend , with thoughts of cutting him or " pushing him off the porch" recently during a recent argument with him. States " some of it is my fault, I feel I've been more on edge off my meds".  Reports she has been abusing cocaine( reports she has been using daily over the last two weeks)  and also has been drinking up to 3  40  ounce beers per day, although states she " can go two or three days without drinking sometimes".  Endorses neuro-vegetative symptoms as below. Endorses some auditory hallucinations, describes them as " like voices telling me about my memories ", but describes preserved reality testing and states " I know they are my own thoughts ".     Principal Problem: <principal problem not specified> Discharge Diagnoses: Active Problems:   Major depressive disorder, recurrent severe without psychotic features (HCC)   Past Psychiatric History: see eval  Past Medical History:  Past Medical History:  Diagnosis Date  . COPD (chronic obstructive pulmonary disease) (HCC)   . Hypertension   . Schizoaffective disorder [facility](HCC)     Past Surgical History:  Procedure Laterality Date  . CHOLECYSTECTOMY     Family  History: History reviewed. No pertinent family history.  Social History:  Social History   Substance and Sexual Activity  Alcohol Use Yes   Comment: 3-4 times. Last drink: Last night      Social History   Substance and Sexual Activity  Drug Use Yes  . Types: "Crack" cocaine   Comment: Crack: Last used last night     Social History   Socioeconomic History  . Marital status: Divorced    Spouse name: Not on file  . Number of children: Not on file  . Years of education: Not on file  . Highest education level: Not on file  Occupational History  . Not on file  Social Needs  . Financial resource strain: Not on file  . Food insecurity:    Worry: Not on file    Inability: Not on file  . Transportation needs:    Medical: Not on file    Non-medical: Not on file  Tobacco Use  . Smoking status: Current Every Day Smoker    Packs/day: 0.50    Years: 33.00    Pack years: 16.50    Types: Cigarettes  . Smokeless tobacco: Never Used  Substance and Sexual Activity  . Alcohol use: Yes    Comment: 3-4 times. Last drink: Last night   . Drug use: Yes    Types: "Crack" cocaine    Comment: Crack: Last used last night   . Sexual activity: Not Currently  Lifestyle  . Physical activity:  Days per week: Not on file    Minutes per session: Not on file  . Stress: Not on file  Relationships  . Social connections:    Talks on phone: Not on file    Gets together: Not on file    Attends religious service: Not on file    Active member of club or organization: Not on file    Attends meetings of clubs or organizations: Not on file    Relationship status: Not on file  Other Topics Concern  . Not on file  Social History Narrative   Pt lives at a group home; receives outpatient services from [facility] Course:    Patient participated in cognitive based groups, pharmacy groups, and overall milieu interaction.  Her meds were adjusted to the regimen listed below.  And she displayed  no dangerous behaviors while here  he reported no side effects from medications by the date of the first she reported feeling "a lot better" regarding her mood and really had no signs or symptoms of any toxicity, by 4 to her progress continued and by 4 3 she was requesting discharge, noted to be alert, fully oriented and cooperative without thoughts of harming self or others, contracting fully and stable for release with meds adjusted as listed below  Physical Findings: AIMS: Facial and Oral Movements Muscles of Facial Expression: None, normal Lips and Perioral Area: None, normal Jaw: None, normal Tongue: None, normal,Extremity Movements Upper (arms, wrists, hands, fingers): None, normal Lower (legs, knees, ankles, toes): None, normal, Trunk Movements Neck, shoulders, hips: None, normal, Overall Severity Severity of abnormal movements (highest score from questions above): None, normal Incapacitation due to abnormal movements: None, normal Patient's awareness of abnormal movements (rate only patient's report): No Awareness, Dental Status Current problems with teeth and/or dentures?: No Does patient usually wear dentures?: No  CIWA:  CIWA-Ar Total: 0 COWS:  COWS Total Score: 1  Musculoskeletal: Strength & Muscle Tone: within normal limits Gait & Station: normal Patient leans: N/A  Psychiatric Specialty Exam: Physical Exam  ROS  Blood pressure 135/79, pulse 69, temperature (!) 97.5 F (36.4 C), temperature source Oral, resp. rate 20, height 5\' 6"  (1.676 m), weight 116.1 kg, last menstrual period 05/19/2018.Body mass index is 41.32 kg/m.  General Appearance: Casual  Eye Contact:  Fair  Speech:  Normal Rate  Volume:  nl  Mood:  Euthymic  Affect:  Congruent  Thought Process:  Coherent  Orientation:  Full (Time, Place, and Person)  Thought Content:  Logical  Suicidal Thoughts:  No  Homicidal Thoughts:  No  Memory:  Immediate;   Good  Judgement:  Good  Insight:  Good  Psychomotor  Activity:  Normal  Concentration:  Concentration: Fair  Recall:  Fair  Fund of Knowledge:  Good  Language:  Good  Akathisia:  Negative  Handed:  Right  AIMS (if indicated):     Assets:  Physical Health Resilience Social Support Talents/Skills  ADL's:  Intact  Cognition:  WNL  Sleep:  Number of Hours: 7.75     Have you used any form of tobacco in the last 30 days? (Cigarettes, Smokeless Tobacco, Cigars, and/or Pipes): Yes  Has this patient used any form of tobacco in the last 30 days? (Cigarettes, Smokeless Tobacco, Cigars, and/or Pipes) Yes, No  Blood Alcohol level:  Lab Results  Component Value Date   [facility] <10 05/26/2018   ETH <10 04/05/2018    Metabolic Disorder Labs:  Lab Results  Component  Value Date   HGBA1C 5.1 05/28/2018   MPG 99.67 05/28/2018   No results found for: PROLACTIN Lab Results  Component Value Date   CHOL 122 05/28/2018   TRIG 63 05/28/2018   HDL 41 05/28/2018   CHOLHDL 3.0 05/28/2018   VLDL 13 05/28/2018   LDLCALC 68 05/28/2018    See Psychiatric Specialty Exam and Suicide Risk Assessment completed by Attending Physician prior to discharge.  Discharge destination:  Home  Is patient on multiple antipsychotic therapies at discharge:  No   Has Patient had three or more failed trials of antipsychotic monotherapy by history:  No  Recommended Plan for Multiple Antipsychotic Therapies: NA   Allergies as of 05/30/2018      Reactions   Haldol [haloperidol] Rash   Pork-derived Products Hives   Prozac [fluoxetine Hcl] Rash      Medication List    STOP taking these medications   benztropine 1 MG tablet Commonly known as:  COGENTIN   divalproex 500 MG 24 hr tablet Commonly known as:  DEPAKOTE ER Replaced by:  divalproex 500 MG DR tablet     TAKE these medications     Indication  ARIPiprazole 5 MG tablet Commonly known as:  ABILIFY Take 1 tablet (5 mg total) by mouth daily. Start taking on:  May 31, 2018 What changed:    medication  strength  how much to take  when to take this  Indication:  Major Depressive Disorder   divalproex 500 MG DR tablet Commonly known as:  DEPAKOTE Take 1 tablet (500 mg total) by mouth at bedtime. Replaces:  divalproex 500 MG 24 hr tablet  Indication:  Schizophrenia   lisinopril-hydrochlorothiazide 20-25 MG tablet Commonly known as:  PRINZIDE,ZESTORETIC Take 1 tablet by mouth daily.  Indication:  High Blood Pressure Disorder   multivitamin with minerals Tabs tablet Take 1 tablet by mouth daily. Start taking on:  May 31, 2018  Indication:  21-Hydroxylase Deficiency      Follow-up Information    [facility] Follow up on 06/09/2018.   Specialty:  Professional Counselor Why:  Therapy appointment with Barbara Cower is Monday, 4/13 at 10:00a.  Barbara Cower will schedule a medication management appointment for patient.  Contact information: [facility] [address] [place] [address] [phone]        [facility] Follow up.   Why:  Your Transitional Care Team will follow up with you after discharge. Please call Angelique Blonder within 24 hours of discharge. Contact information: [address] [place] [address] [phone]          SignedMalvin Johns, MD 05/30/2018, 10:36 AM

## 2018-05-30 NOTE — [facility] Suicide Risk Assessment (Signed)
[facility] Discharge Suicide Risk Assessment   Principal Problem: Severity of depressive symptoms Discharge Diagnoses: Active Problems:   Major depressive disorder, recurrent severe without psychotic features (HCC)   Total Time spent with patient: 45 minutes  Alert and oriented mood is stable no thoughts of harming self or others seems euthymic contracting fully Mental Status Per Nursing Assessment::   On Admission:  Suicidal ideation indicated by others  Demographic Factors:  Caucasian  Loss Factors: Decrease in vocational status  Historical Factors: NA  Risk Reduction Factors:   NA  Continued Clinical Symptoms:  Depression:   Severe  Cognitive Features That Contribute To Risk:  None    Suicide Risk:  Minimal: No identifiable suicidal ideation.  Patients presenting with no risk factors but with morbid ruminations; may be classified as minimal risk based on the severity of the depressive symptoms  Follow-up Information    [facility] Follow up on 06/09/2018.   Specialty:  Professional Counselor Why:  Therapy appointment with Barbara Cower is Monday, 4/13 at 10:00a.  Barbara Cower will schedule a medication management appointment for patient.  Contact information: [facility] [place] [address] [place] [address] [phone]        [facility] Follow up.   Why:  Your Transitional Care Team will follow up with you after discharge. Please call Angelique Blonder within 24 hours of discharge. Contact information: [address] [place] [address] [phone]           Plan Of Care/Follow-up recommendations:  Activity:  full  Esthefany Herrig, MD 05/30/2018, 9:37 AM

## 2018-05-30 NOTE — Progress Notes (Signed)
Adult Psychoeducational Group Note  Date:  05/30/2018 Time:  4:42 AM  Group Topic/Focus:  Wrap-Up Group:   The focus of this group is to help patients review their daily goal of treatment and discuss progress on daily workbooks.  Participation Level:  Active  Participation Quality:  Appropriate  Affect:  Appropriate  Cognitive:  Appropriate  Insight: Appropriate  Engagement in Group:  Engaged  Modes of Intervention:  Discussion  Additional Comments:  Pt said her day was a 8.  . The one positive thing happen she able to do thinking clear, working on ideas once she leave here discharge plan and prevent from coming back.   Charna Busman Long 05/30/2018, 4:42 AM

## 2018-06-06 NOTE — Congregational Nurse Program (Signed)
  Dept: [phone]   Congregational Nurse Program Note  Date of Encounter: 04/28/2018  Past Medical History: Past Medical History:  Diagnosis Date  . COPD (chronic obstructive pulmonary disease) (HCC)   . Hypertension   . Schizoaffective disorder (HCC)     Encounter Details:BP check. Clientt states that she has gone to [facility] and has asked for ACT team assistance. She is agitated this morning and does not want to interact with CN. Became angry with CN and left encounter. Shann Medal RN BSN CNP [phone]

## 2018-08-15 ENCOUNTER — Other Ambulatory Visit: Payer: Self-pay

## 2018-08-15 ENCOUNTER — Ambulatory Visit: Payer: Medicaid Other | Attending: Nurse Practitioner | Admitting: Nurse Practitioner

## 2018-11-21 ENCOUNTER — Other Ambulatory Visit: Payer: Self-pay

## 2018-11-21 DIAGNOSIS — Z20822 Contact with and (suspected) exposure to covid-19: Secondary | ICD-10-CM

## 2018-11-22 LAB — NOVEL CORONAVIRUS, NAA: SARS-CoV-2, NAA: NOT DETECTED

## 2019-06-15 ENCOUNTER — Ambulatory Visit: Payer: Medicaid Other | Admitting: Obstetrics and Gynecology

## 2019-06-16 ENCOUNTER — Ambulatory Visit: Payer: Medicaid Other | Admitting: Obstetrics and Gynecology

## 2019-09-18 ENCOUNTER — Ambulatory Visit: Payer: Medicaid Other | Attending: Critical Care Medicine

## 2019-09-18 DIAGNOSIS — Z23 Encounter for immunization: Secondary | ICD-10-CM

## 2019-09-18 NOTE — Progress Notes (Signed)
   Covid-19 Vaccination Clinic  Name:  Melissa Dickerson    MRN: [id number] DOB: Dec 16, 1970  09/18/2019  [name] was observed post Covid-19 immunization for 15 minutes without incident. She was provided with Vaccine Information Sheet and instruction to access the V-Safe system.   [name] was instructed to call 911 with any severe reactions post vaccine: Marland Kitchen Difficulty breathing  . Swelling of face and throat  . A fast heartbeat  . A bad rash all over body  . Dizziness and weakness   Immunizations Administered    Name Date Dose VIS Date Route   Moderna COVID-19 Vaccine 09/18/2019 11:05 AM 0.5 mL 01/2019 Intramuscular   Manufacturer: Moderna   Lot: [id number]   NDC: 12751-700-17

## 2019-09-29 ENCOUNTER — Ambulatory Visit: Payer: Self-pay

## 2019-10-10 ENCOUNTER — Encounter (HOSPITAL_COMMUNITY): Payer: Self-pay

## 2019-10-10 ENCOUNTER — Other Ambulatory Visit: Payer: Self-pay

## 2019-10-10 ENCOUNTER — Emergency Department (HOSPITAL_COMMUNITY)
Admission: EM | Admit: 2019-10-10 | Discharge: 2019-10-10 | Disposition: A | Discharge status: Home / Self Care | Payer: Medicaid Other | Attending: Emergency Medicine | Admitting: Emergency Medicine

## 2019-10-10 ENCOUNTER — Emergency Department (HOSPITAL_COMMUNITY): Payer: Medicaid Other

## 2019-10-10 DIAGNOSIS — J441 Chronic obstructive pulmonary disease with (acute) exacerbation: Secondary | ICD-10-CM | POA: Diagnosis not present

## 2019-10-10 DIAGNOSIS — R0602 Shortness of breath: Secondary | ICD-10-CM | POA: Diagnosis present

## 2019-10-10 DIAGNOSIS — Z79899 Other long term (current) drug therapy: Secondary | ICD-10-CM | POA: Diagnosis not present

## 2019-10-10 DIAGNOSIS — Z20822 Contact with and (suspected) exposure to covid-19: Secondary | ICD-10-CM | POA: Diagnosis not present

## 2019-10-10 DIAGNOSIS — N3000 Acute cystitis without hematuria: Secondary | ICD-10-CM | POA: Diagnosis not present

## 2019-10-10 DIAGNOSIS — I10 Essential (primary) hypertension: Secondary | ICD-10-CM | POA: Diagnosis not present

## 2019-10-10 DIAGNOSIS — R911 Solitary pulmonary nodule: Secondary | ICD-10-CM

## 2019-10-10 DIAGNOSIS — F17210 Nicotine dependence, cigarettes, uncomplicated: Secondary | ICD-10-CM | POA: Insufficient documentation

## 2019-10-10 DIAGNOSIS — Z720 Tobacco use: Secondary | ICD-10-CM

## 2019-10-10 LAB — CBC WITH DIFFERENTIAL/PLATELET
Abs Immature Granulocytes: 0.1 10*3/uL — ABNORMAL HIGH (ref 0.00–0.07)
Basophils Absolute: 0 10*3/uL (ref 0.0–0.1)
Basophils Relative: 1 %
Eosinophils Absolute: 0 10*3/uL (ref 0.0–0.5)
Eosinophils Relative: 0 %
HCT: 44.6 % (ref 36.0–46.0)
Hemoglobin: 13.9 g/dL (ref 12.0–15.0)
Immature Granulocytes: 1 %
Lymphocytes Relative: 18 %
Lymphs Abs: 1.5 10*3/uL (ref 0.7–4.0)
MCH: 28.3 pg (ref 26.0–34.0)
MCHC: 31.2 g/dL (ref 30.0–36.0)
MCV: 90.7 fL (ref 80.0–100.0)
Monocytes Absolute: 0.1 10*3/uL (ref 0.1–1.0)
Monocytes Relative: 1 %
Neutro Abs: 6.7 10*3/uL (ref 1.7–7.7)
Neutrophils Relative %: 79 %
Platelets: 219 10*3/uL (ref 150–400)
RBC: 4.92 MIL/uL (ref 3.87–5.11)
RDW: 14.7 % (ref 11.5–15.5)
WBC: 8.5 10*3/uL (ref 4.0–10.5)
nRBC: 0 % (ref 0.0–0.2)

## 2019-10-10 LAB — URINALYSIS, ROUTINE W REFLEX MICROSCOPIC
Bilirubin Urine: NEGATIVE
Glucose, UA: NEGATIVE mg/dL
Hgb urine dipstick: NEGATIVE
Ketones, ur: NEGATIVE mg/dL
Nitrite: POSITIVE — AB
Protein, ur: NEGATIVE mg/dL
Specific Gravity, Urine: 1.028 (ref 1.005–1.030)
pH: 5 (ref 5.0–8.0)

## 2019-10-10 LAB — BASIC METABOLIC PANEL
Anion gap: 7 (ref 5–15)
BUN: 20 mg/dL (ref 6–20)
CO2: 26 mmol/L (ref 22–32)
Calcium: 9 mg/dL (ref 8.9–10.3)
Chloride: 105 mmol/L (ref 98–111)
Creatinine, Ser: 0.7 mg/dL (ref 0.44–1.00)
GFR calc Af Amer: >60 mL/min (ref >60)
GFR calc non Af Amer: >60 mL/min (ref >60)
Glucose, Bld: 143 mg/dL — ABNORMAL HIGH (ref 70–99)
Potassium: 4.7 mmol/L (ref 3.5–5.1)
Sodium: 138 mmol/L (ref 135–145)

## 2019-10-10 LAB — SARS CORONAVIRUS 2 BY RT PCR (HOSPITAL ORDER, PERFORMED IN ~~LOC~~ HOSPITAL LAB): SARS Coronavirus 2: NEGATIVE

## 2019-10-10 MED ORDER — MONTELUKAST SODIUM 10 MG PO TABS: 10.0000 mg | ORAL_TABLET | Freq: Every day | ORAL | 0 refills | Status: DC

## 2019-10-10 MED ORDER — ALBUTEROL SULFATE HFA 108 (90 BASE) MCG/ACT IN AERS
8.0000 | INHALATION_SPRAY | Freq: Once | RESPIRATORY_TRACT | Status: AC
  Administered 2019-10-10: 8 via RESPIRATORY_TRACT
  Filled 2019-10-10: qty 6.7

## 2019-10-10 MED ORDER — ARIPIPRAZOLE 15 MG PO TABS: 15.0000 mg | ORAL_TABLET | Freq: Every day | ORAL | 0 refills | Status: DC

## 2019-10-10 MED ORDER — IPRATROPIUM BROMIDE HFA 17 MCG/ACT IN AERS
2.0000 | INHALATION_SPRAY | Freq: Once | RESPIRATORY_TRACT | Status: DC
  Filled 2019-10-10: qty 12.9

## 2019-10-10 MED ORDER — ALBUTEROL SULFATE HFA 108 (90 BASE) MCG/ACT IN AERS: 1.0000 | INHALATION_SPRAY | Freq: Three times a day (TID) | RESPIRATORY_TRACT | 0 refills | Status: DC | PRN

## 2019-10-10 MED ORDER — AZITHROMYCIN 250 MG PO TABS: 250.0000 mg | ORAL_TABLET | Freq: Every day | ORAL | 0 refills | Status: DC

## 2019-10-10 MED ORDER — LISINOPRIL-HYDROCHLOROTHIAZIDE 10-12.5 MG PO TABS: 1.0000 | ORAL_TABLET | Freq: Every day | ORAL | 0 refills | Status: DC

## 2019-10-10 MED ORDER — VITAMIN D (ERGOCALCIFEROL) 1.25 MG (50000 UNIT) PO CAPS: 50000.0000 [IU] | ORAL_CAPSULE | ORAL | 0 refills | Status: AC

## 2019-10-10 MED ORDER — AEROCHAMBER PLUS FLO-VU MISC: 1.0000 | Freq: Once | Status: DC

## 2019-10-10 MED ORDER — GABAPENTIN 300 MG PO CAPS: 300.0000 mg | ORAL_CAPSULE | Freq: Every day | ORAL | 0 refills | Status: DC

## 2019-10-10 MED ORDER — CEPHALEXIN 500 MG PO CAPS: 500.0000 mg | ORAL_CAPSULE | Freq: Two times a day (BID) | ORAL | 0 refills | Status: AC

## 2019-10-10 MED ORDER — BENZTROPINE MESYLATE 1 MG PO TABS: 1.0000 mg | ORAL_TABLET | Freq: Every day | ORAL | 0 refills | Status: DC

## 2019-10-10 MED ORDER — METHYLPREDNISOLONE 4 MG PO TBPK
ORAL_TABLET | ORAL | 0 refills | Status: DC
Start: 2019-10-10 — End: 2021-07-12

## 2019-10-10 MED ORDER — ADVAIR DISKUS 100-50 MCG/DOSE IN AEPB: 1.0000 | INHALATION_SPRAY | Freq: Two times a day (BID) | RESPIRATORY_TRACT | 0 refills | Status: DC

## 2019-10-10 MED ORDER — DIVALPROEX SODIUM 250 MG PO DR TAB: 250.0000 mg | DELAYED_RELEASE_TABLET | Freq: Two times a day (BID) | ORAL | 0 refills | Status: DC

## 2019-10-10 MED ORDER — IPRATROPIUM-ALBUTEROL 0.5-2.5 (3) MG/3ML IN SOLN: 3.0000 mL | Freq: Four times a day (QID) | RESPIRATORY_TRACT | 0 refills | Status: AC | PRN

## 2019-10-10 MED ORDER — FOSFOMYCIN TROMETHAMINE 3 G PO PACK
3.0000 g | PACK | Freq: Once | ORAL | Status: DC
  Filled 2019-10-10: qty 3

## 2019-10-10 MED ORDER — AEROCHAMBER Z-STAT PLUS/MEDIUM MISC
1.0000 | Freq: Once | Status: AC
  Administered 2019-10-10: 1
  Filled 2019-10-10: qty 1

## 2019-10-10 NOTE — ED Provider Notes (Signed)
[facility]-EMERGENCY DEPT Provider Note   CSN: [id number] Arrival date & time: 10/10/19  1335     History Chief Complaint  Patient presents with  . Shortness of Breath    Melissa Dickerson is a 49 y.o. female.  With a past medical history of COPD, homelessness, schizoaffective disorder.  She has had chronic shortness of breath, worsening over the past few days with wheezing.  She is out of all of her medications.  She denies fever or chills, abdominal pain, nausea or vomiting.  She is not had her Covid vaccination.  She denies any known exposures.  HPI     Past Medical History:  Diagnosis Date  . COPD (chronic obstructive pulmonary disease) (HCC)   . Hypertension   . Schizoaffective disorder [facility])     Patient Active Problem List   Diagnosis Date Noted  . Major depressive disorder, recurrent severe without psychotic features (HCC) 05/26/2018  . Tobacco abuse 12/10/2016  . GERD (gastroesophageal reflux disease) 12/10/2016  . HTN (hypertension) 12/05/2016  . COPD exacerbation (HCC) 12/04/2016  . Schizoaffective disorder, depressive type (HCC) 03/12/2016    Past Surgical History:  Procedure Laterality Date  . CHOLECYSTECTOMY       OB History   No obstetric history on file.     No family history on file.  Social History   Tobacco Use  . Smoking status: Current Every Day Smoker    Packs/day: 0.50    Years: 33.00    Pack years: 16.50    Types: Cigarettes  . Smokeless tobacco: Never Used  Vaping Use  . Vaping Use: Never used  Substance Use Topics  . Alcohol use: Not Currently    Comment: 2 months last use  . Drug use: Not Currently    Types: "Crack" cocaine    Comment: 2 months last use     Home Medications Prior to Admission medications   Medication Sig Start Date End Date Taking? Authorizing Provider  ADVAIR DISKUS 100-50 MCG/DOSE AEPB Inhale 1 puff into the lungs every 12 (twelve) hours. 07/21/19   [provider]  albuterol  (VENTOLIN HFA) 108 (90 Base) MCG/ACT inhaler Inhale 1 puff into the lungs 3 (three) times daily as needed for shortness of breath or wheezing. 07/21/19   [provider]  ARIPiprazole (ABILIFY) 15 MG tablet Take 15 mg by mouth daily. 08/11/19   [provider]  ARIPiprazole (ABILIFY) 5 MG tablet Take 1 tablet (5 mg total) by mouth daily. Patient not taking: Reported on 10/10/2019 05/31/18   Malvin Johns, MD  benztropine (COGENTIN) 1 MG tablet Take 1 mg by mouth at bedtime. 08/15/19   [provider]  divalproex (DEPAKOTE) 250 MG DR tablet Take 250 mg by mouth 2 (two) times daily. 08/17/19   [provider]  divalproex (DEPAKOTE) 500 MG DR tablet Take 1 tablet (500 mg total) by mouth at bedtime. Patient not taking: Reported on 10/10/2019 05/30/18   Malvin Johns, MD  gabapentin (NEURONTIN) 300 MG capsule Take 300 mg by mouth at bedtime. 07/30/19   [provider]  ipratropium-albuterol (DUONEB) 0.5-2.5 (3) MG/3ML SOLN Take 3 mLs by nebulization every 6 (six) hours as needed for shortness of breath or wheezing. 07/22/19   [provider]  lisinopril-hydrochlorothiazide (PRINZIDE,ZESTORETIC) 20-25 MG tablet Take 1 tablet by mouth daily. Patient not taking: Reported on 10/10/2019 05/30/18   Malvin Johns, MD  lisinopril-hydrochlorothiazide (ZESTORETIC) 10-12.5 MG tablet Take 1 tablet by mouth daily. 07/22/19   [provider]  meloxicam (MOBIC) 7.5 MG tablet Take 7.5 mg by mouth daily. 07/22/19   [provider]  montelukast (SINGULAIR) 10 MG tablet Take 10 mg by mouth daily. 07/22/19   [provider]  Multiple Vitamin (MULTIVITAMIN WITH MINERALS) TABS tablet Take 1 tablet by mouth daily. 05/31/18   Malvin Johns, MD  tiZANidine (ZANAFLEX) 4 MG tablet Take 4 mg by mouth 2 (two) times daily as needed for muscle spasms. 07/22/19   [provider]  Vitamin D, Ergocalciferol, (DRISDOL) 1.25 MG (50000 UNIT) CAPS capsule Take 50,000 Units by  mouth once a week. 07/22/19   [provider]    Allergies    Haldol [haloperidol], Pork-derived products, and Prozac [fluoxetine hcl]  Review of Systems   Review of Systems Ten systems reviewed and are negative for acute change, except as noted in the HPI.   Physical Exam Updated Vital Signs BP (!) 152/81   Pulse 65   Temp 98.1 F (36.7 C) (Oral)   Resp 16   Ht 5\' 6"  (1.676 m)   LMP 09/16/2019   SpO2 95%   BMI 41.32 kg/m   Physical Exam Vitals and nursing note reviewed.  Constitutional:      General: She is not in acute distress.    Appearance: She is well-developed. She is not diaphoretic.  HENT:     Head: Normocephalic and atraumatic.  Eyes:     General: No scleral icterus.    Conjunctiva/sclera: Conjunctivae normal.  Cardiovascular:     Rate and Rhythm: Normal rate and regular rhythm.     Heart sounds: Normal heart sounds. No murmur heard.  No friction rub. No gallop.   Pulmonary:     Effort: Pulmonary effort is normal. No respiratory distress.     Breath sounds: Wheezing and rhonchi present.  Abdominal:     General: Bowel sounds are normal. There is no distension.     Palpations: Abdomen is soft. There is no mass.     Tenderness: There is no abdominal tenderness. There is no guarding.  Musculoskeletal:     Cervical back: Normal range of motion.  Skin:    General: Skin is warm and dry.  Neurological:     Mental Status: She is alert and oriented to person, place, and time.  Psychiatric:        Behavior: Behavior normal.     ED Results / Procedures / Treatments   Labs (all labs ordered are listed, but only abnormal results are displayed) Labs Reviewed  SARS CORONAVIRUS 2 BY RT PCR (HOSPITAL ORDER, PERFORMED IN [facility])  BASIC METABOLIC PANEL  CBC WITH DIFFERENTIAL/PLATELET  URINALYSIS, ROUTINE W REFLEX MICROSCOPIC    EKG None  Radiology DG Chest 2 View  Result Date: 10/10/2019 CLINICAL DATA:  Shortness of breath and  respiratory distress 3 days. EXAM: CHEST - 2 VIEW COMPARISON:  04/05/2018 FINDINGS: Lungs are adequately inflated demonstrate no focal airspace consolidation or effusion. 7-8 mm nodular density over the right mid lung. Cardiomediastinal silhouette and remainder of the exam is unchanged. IMPRESSION: 1. No acute cardiopulmonary disease. 2. 7-8 mm nodular density over the right mid lung. Recommend noncontrast chest CT on elective basis for further evaluation. Electronically Signed   By: 06/04/2018 M.D.   On: 10/10/2019 15:05    Procedures Procedures (including critical care time)  Medications Ordered in ED Medications  ipratropium (ATROVENT HFA) inhaler 2 puff (has no administration in time range)  albuterol (VENTOLIN HFA) 108 (90 Base) MCG/ACT inhaler  8 puff (8 puffs Inhalation Given 10/10/19 1752)  aerochamber Z-Stat Plus/medium 1 each (1 each Other Given 10/10/19 1752)    ED Course  I have reviewed the triage vital signs and the nursing notes.  Pertinent labs & imaging results that were available during my care of the patient were reviewed by me and considered in my medical decision making (see chart for details).    MDM Rules/Calculators/A&P                          49 year old female here with complaint of shortness of breath. The emergent differential diagnosis for shortness of breath includes, but is not limited to, Pulmonary edema, bronchoconstriction, Pneumonia, Pulmonary embolism, Pneumotherax/ Hemothorax, Dysrythmia, ACS.  Patient's work-up includes a BMP which shows elevated blood glucose, CBC without significant abnormality.  Urinalysis shows positive nitrates, leukocytes and bacteria.  I personally ordered and returned.  It and reviewed two-view chest x-ray which shows no evidence of acute pneumonia.  There is an 44mm nodule in the lungs.  I ordered and reviewed images of CT chest which reaffirms 8 mm nodule.  Patient is a daily smoker and will need repeat CT scan in 6 months.   Unfortunately fosfomycin not available at this facility and was taking a very long time to be courier to from: So the patient will get oral antibiotics which I have ordered.  We will also treat for COPD exacerbation in the outpatient setting with Medrol taper.  She had her albuterol refilled.  Azithromycin given.  Patient breathing improved significantly after breathing treatment here in the emergency department.  Covid test is negative.  She appears otherwise appropriate for discharge at this time.  I have refilled the patient's daily medications at her request  Final Clinical Impression(s) / ED Diagnoses Final diagnoses:  None    Rx / DC Orders ED Discharge Orders    None       Arthor Captain, PA-C 10/11/19 0002    Mancel Bale, MD 10/11/19 2344

## 2019-10-10 NOTE — ED Triage Notes (Signed)
Patient BIB [facility], [facility]r, resp distress x 3 days, - history of COPD, patient told EMS shobr got worse after smoked a cigarette today. Patient has 1st part of vaccine. Patient hasn't been able to take any of her medications in a month . Wheezing in all lung fields. VS - bp 142/70, 98% 4L [place] after receiving 125 solumedrol and 2 g mag en route, pulse 72, cbg 110, patient was originally 95% o2 room air - patient has nausea.

## 2019-10-10 NOTE — ED Notes (Signed)
Monurol is coming from [facility] and patient can be D/C'ed once she receives.

## 2019-10-10 NOTE — ED Notes (Signed)
No Atrovent inhaler found in triage or ED pixus, pharmacy called and PA notified.

## 2019-10-10 NOTE — ED Notes (Signed)
Patient ambulated in room, no acute distress, oxygen 94-97% RA.

## 2019-10-10 NOTE — Discharge Instructions (Signed)
Your CT scan showed an 8 mm nodule in your lung.  Because you are a smoker you are high risk and will need a repeat CT of the chest in 6 months to make sure that the size of the nodule has not changed. I am discharging you with information on how to quit smoking and would advise that you do this. Given the fact that you are having a lot of wheezing at this time will be a good opportunity to stop using tobacco.  10 days without tobacco and you are no longer physically addicted to nicotine. Please return for  the following Get help right away if: You have worsening shortness of breath, even when resting. You have trouble talking. You have severe chest pain. You cough up blood. You have a fever. You have weakness, vomit repeatedly, or faint. You feel confused. You are not able to sleep because of your symptoms. You have trouble doing daily activities.

## 2019-10-20 ENCOUNTER — Ambulatory Visit: Payer: Self-pay

## 2020-04-04 IMAGING — CR DG FOOT COMPLETE 3+V*L*
3 series · 3 of 3 positions shown · non-contrast
Comparison: None.

CLINICAL DATA: Left foot injury.

EXAM:
LEFT FOOT - COMPLETE 3+ VIEW

[x foot ap left]
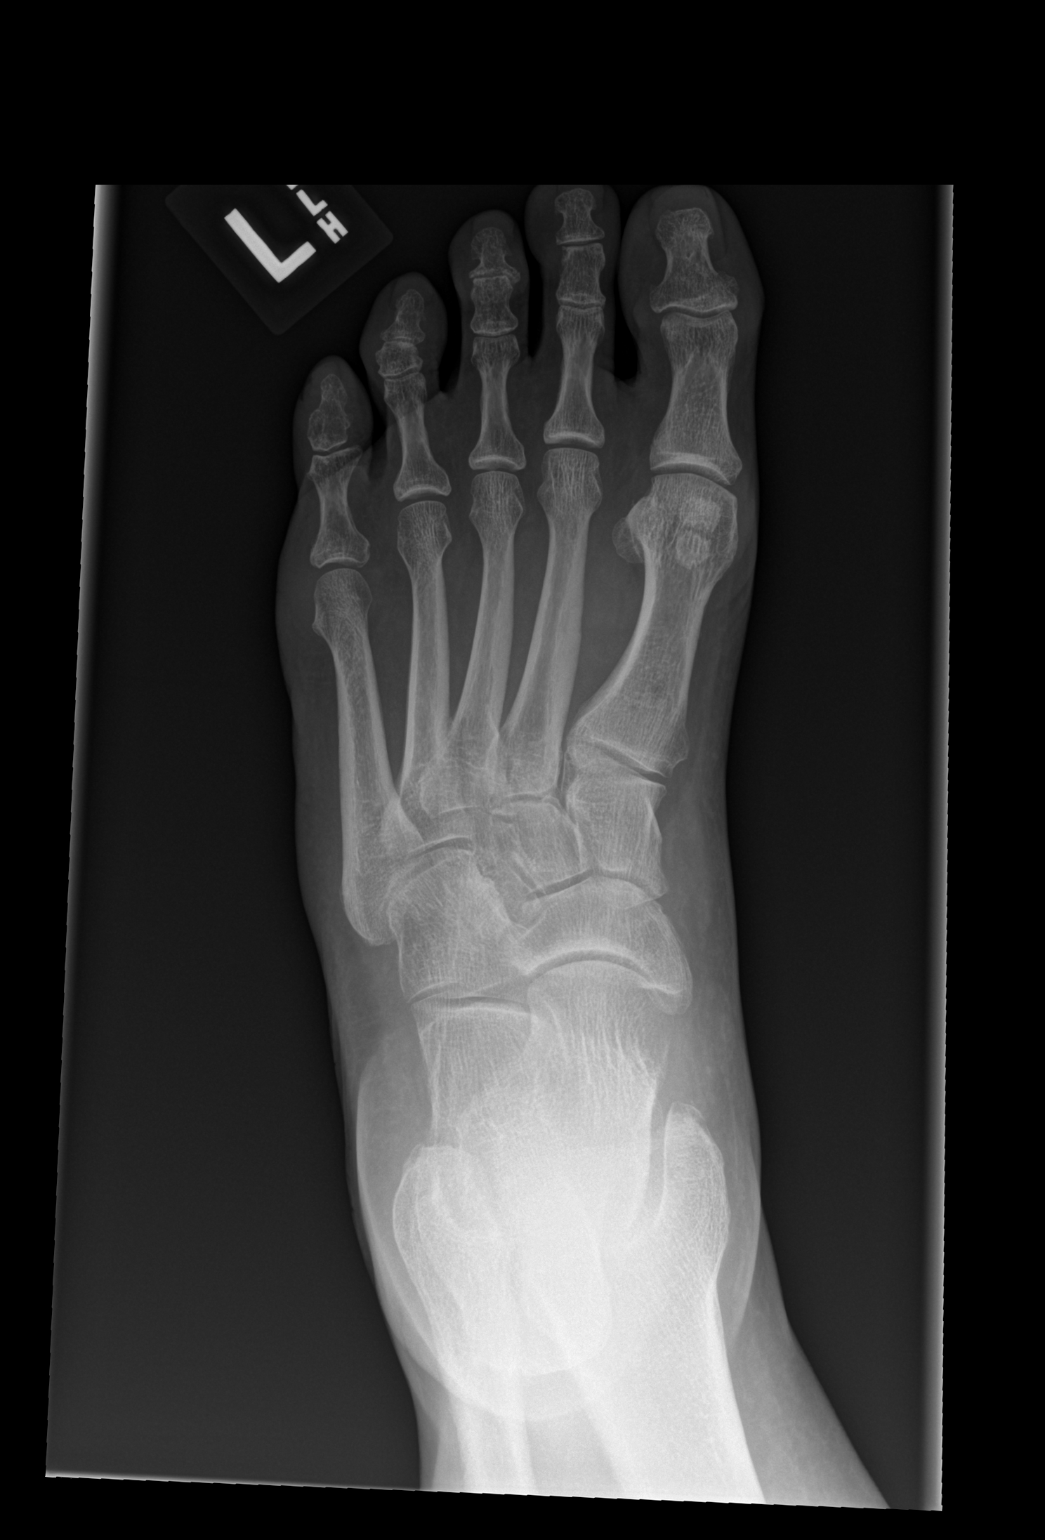

[x foot obl left]
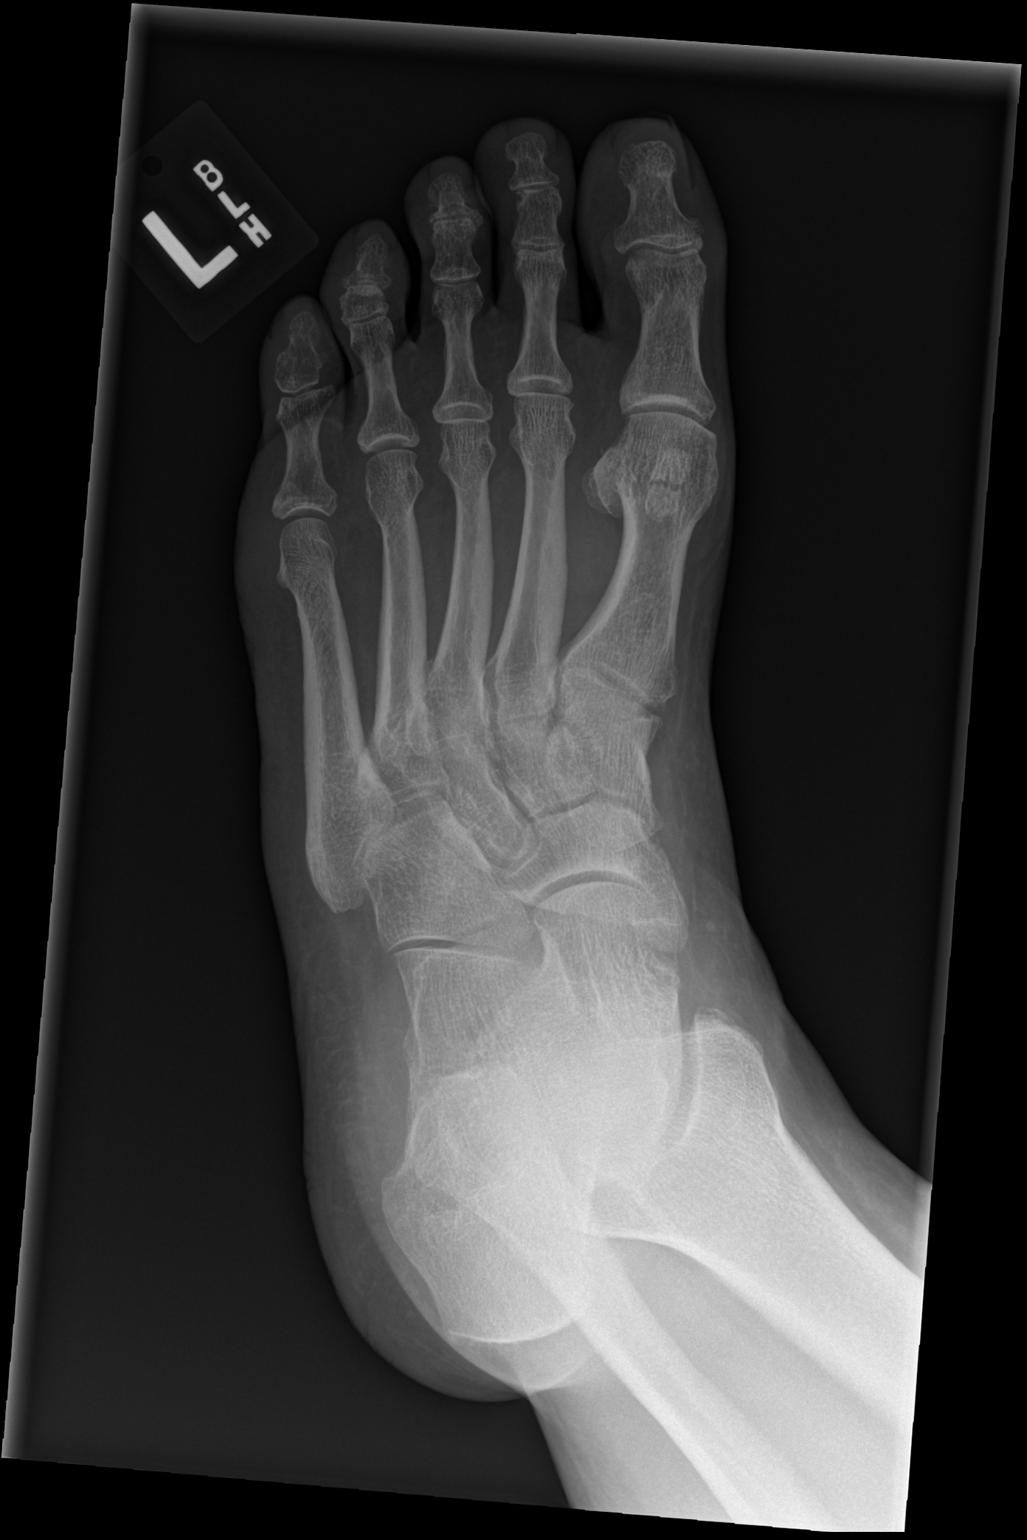

[x foot lat left]
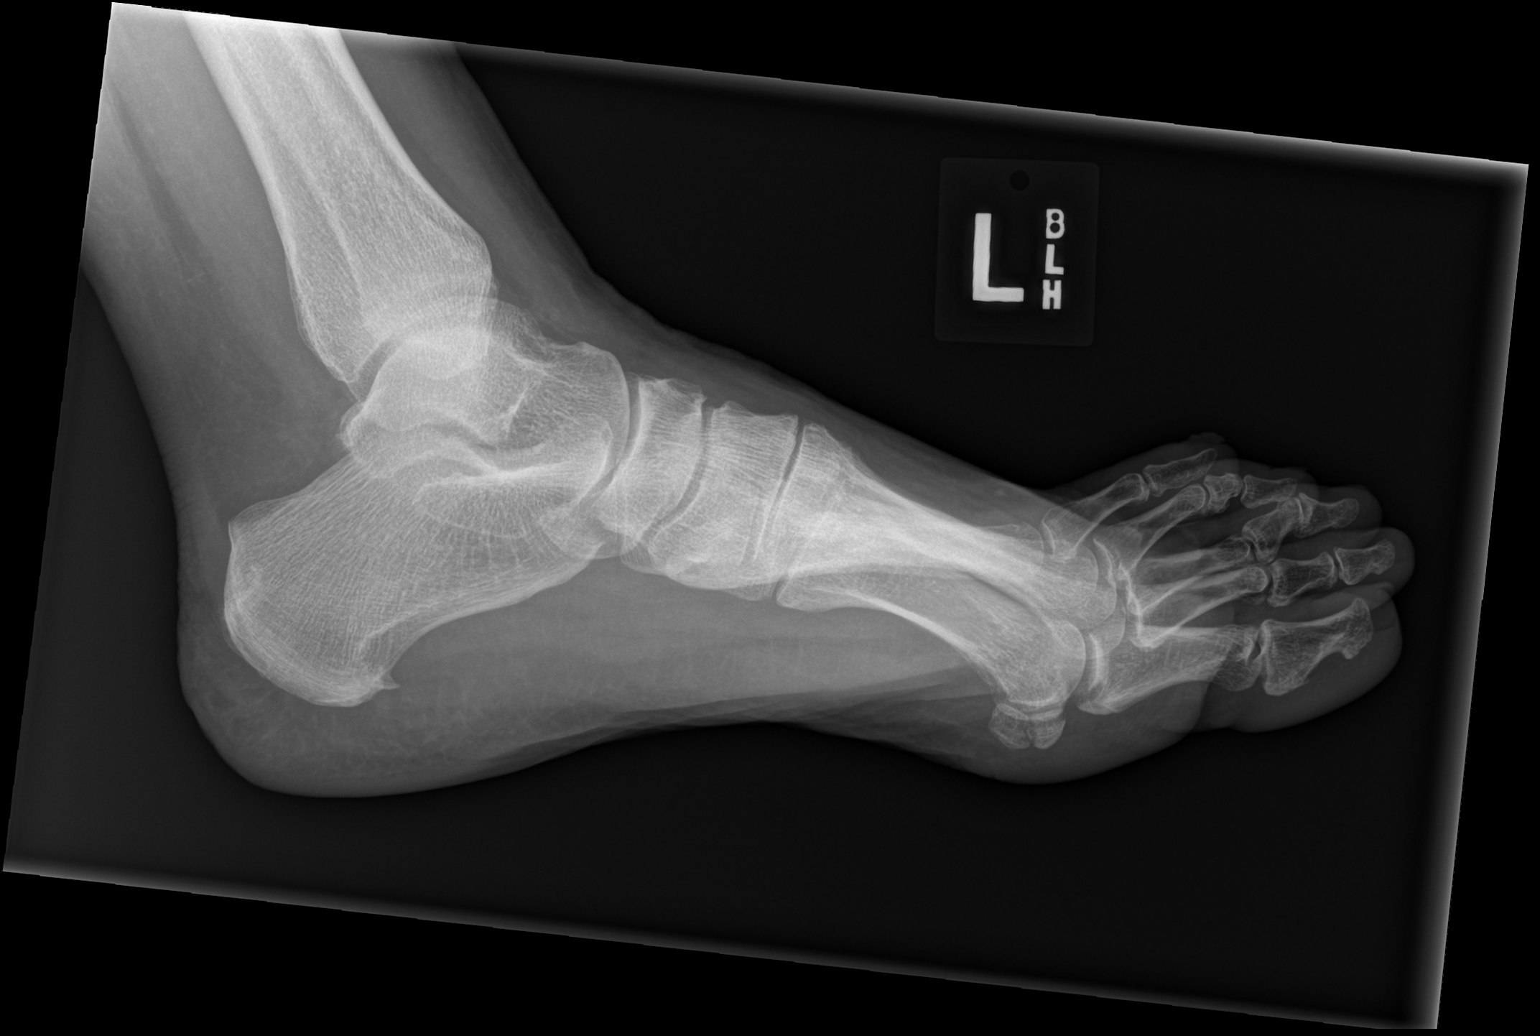

[3 of 3 positions shown; findings below may reference images not displayed]

FINDINGS: There is no evidence of fracture or dislocation. There is no
evidence of arthropathy or other focal bone abnormality. Soft
tissues are unremarkable.
IMPRESSION: Negative.

## 2020-04-04 IMAGING — CR DG FOOT COMPLETE 3+V*R*
3 series · 3 of 3 positions shown · non-contrast
Comparison: None.

CLINICAL DATA: Right foot injury.

EXAM:
RIGHT FOOT COMPLETE - 3+ VIEW

[x foot ap right]
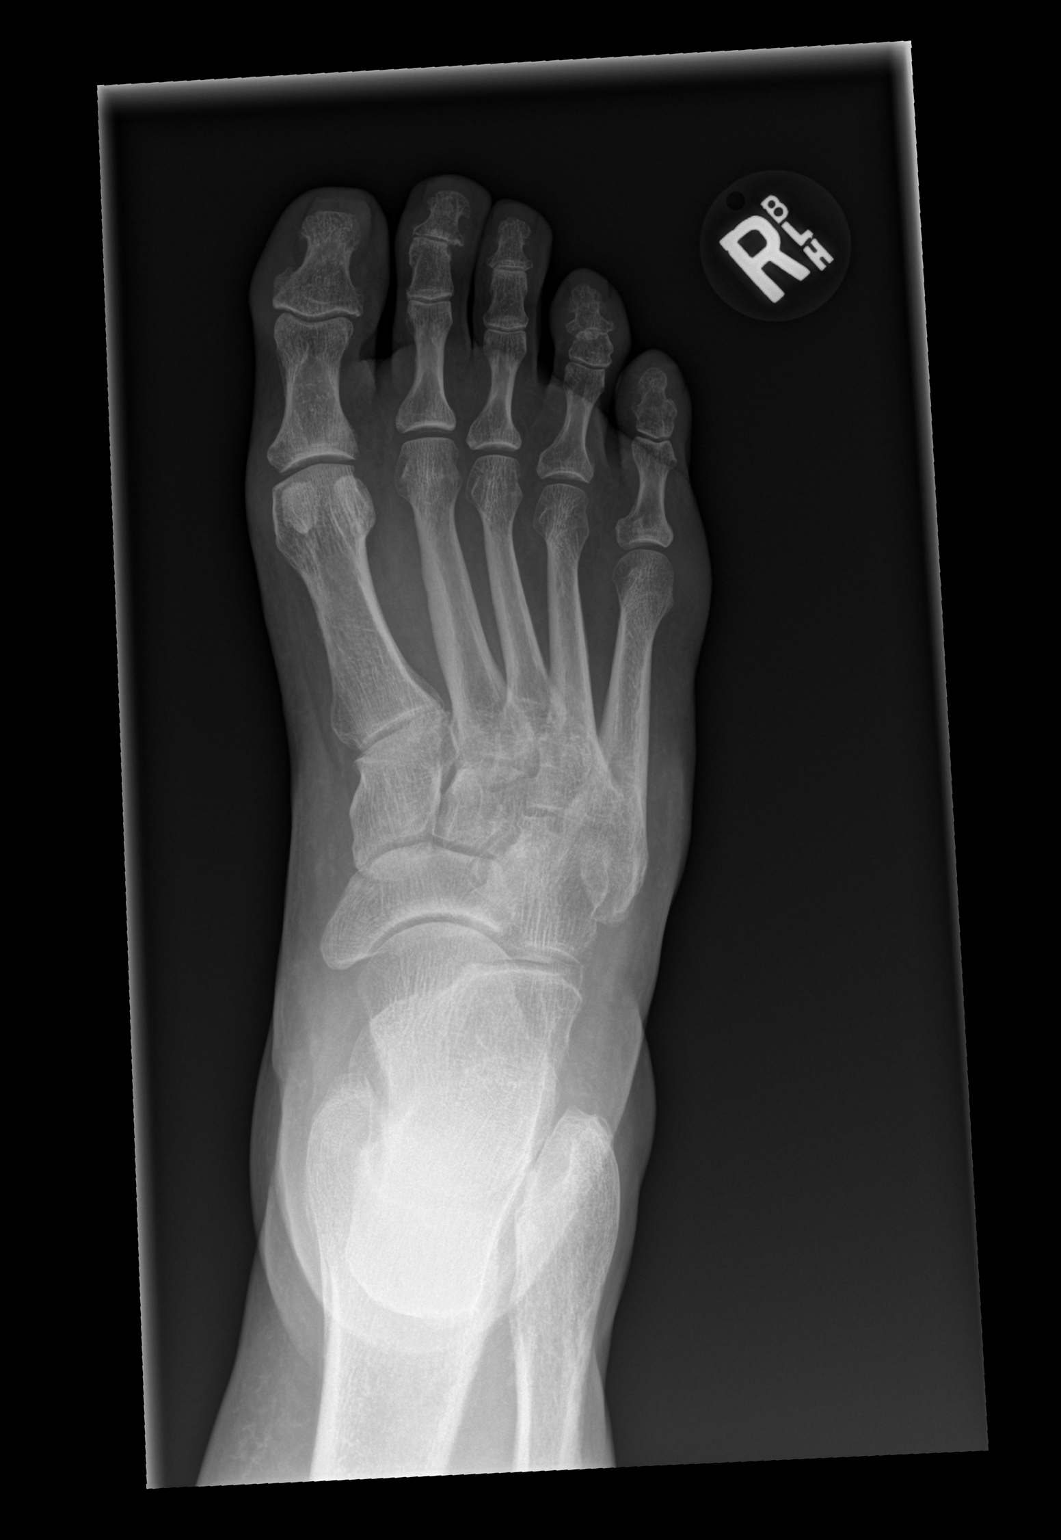

[x foot obl right]
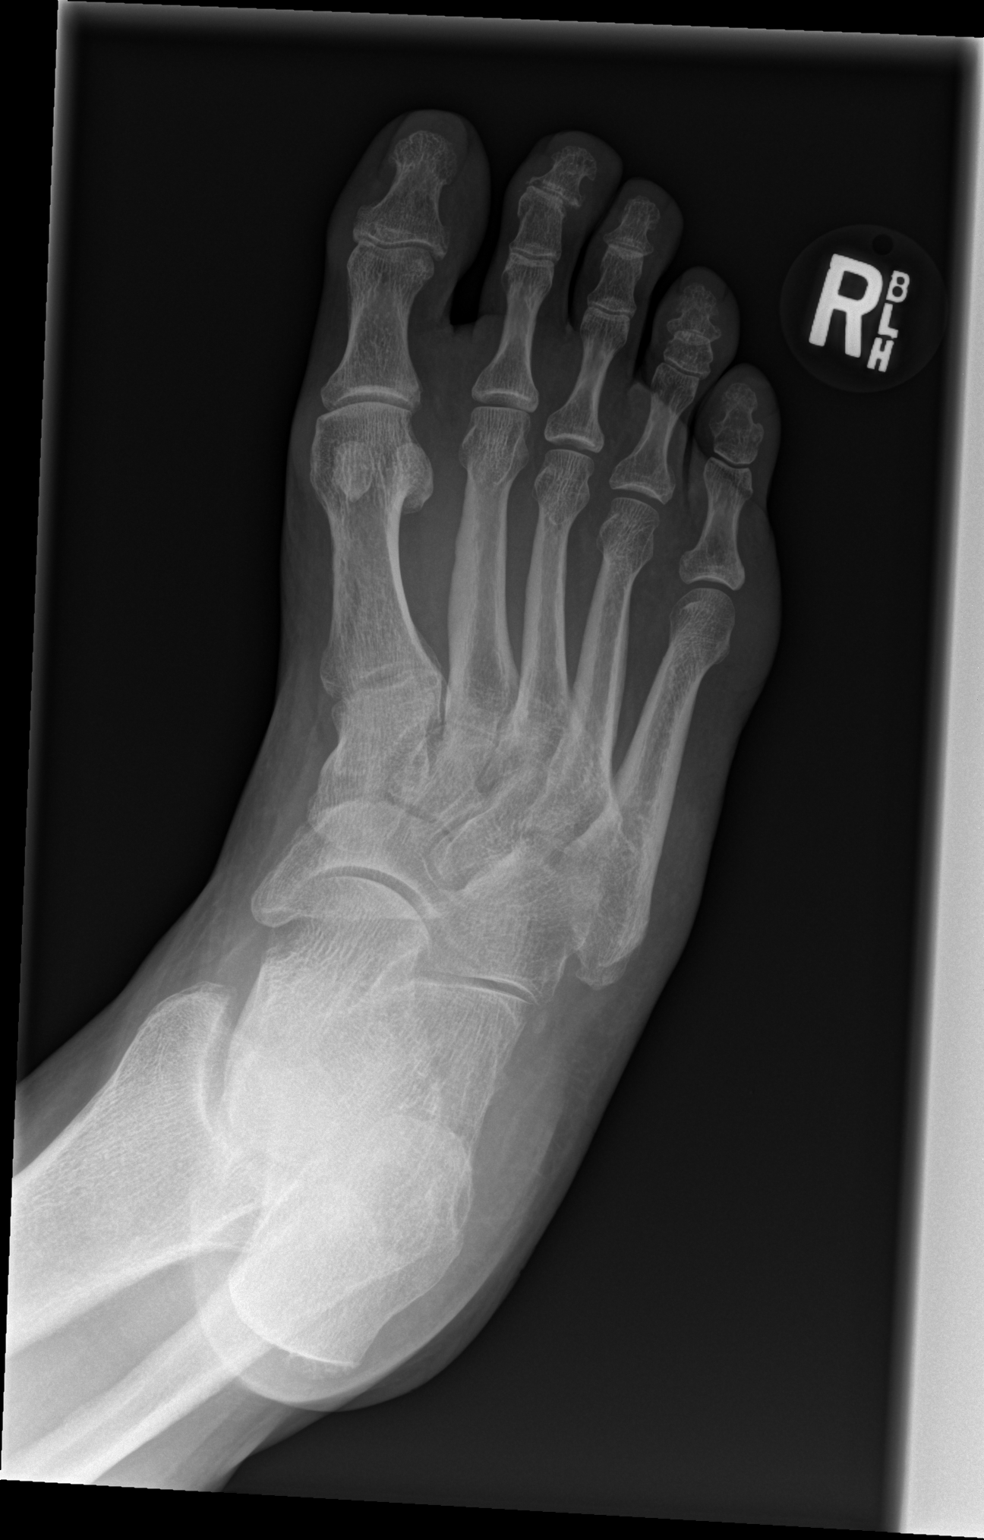

[x foot lat right]
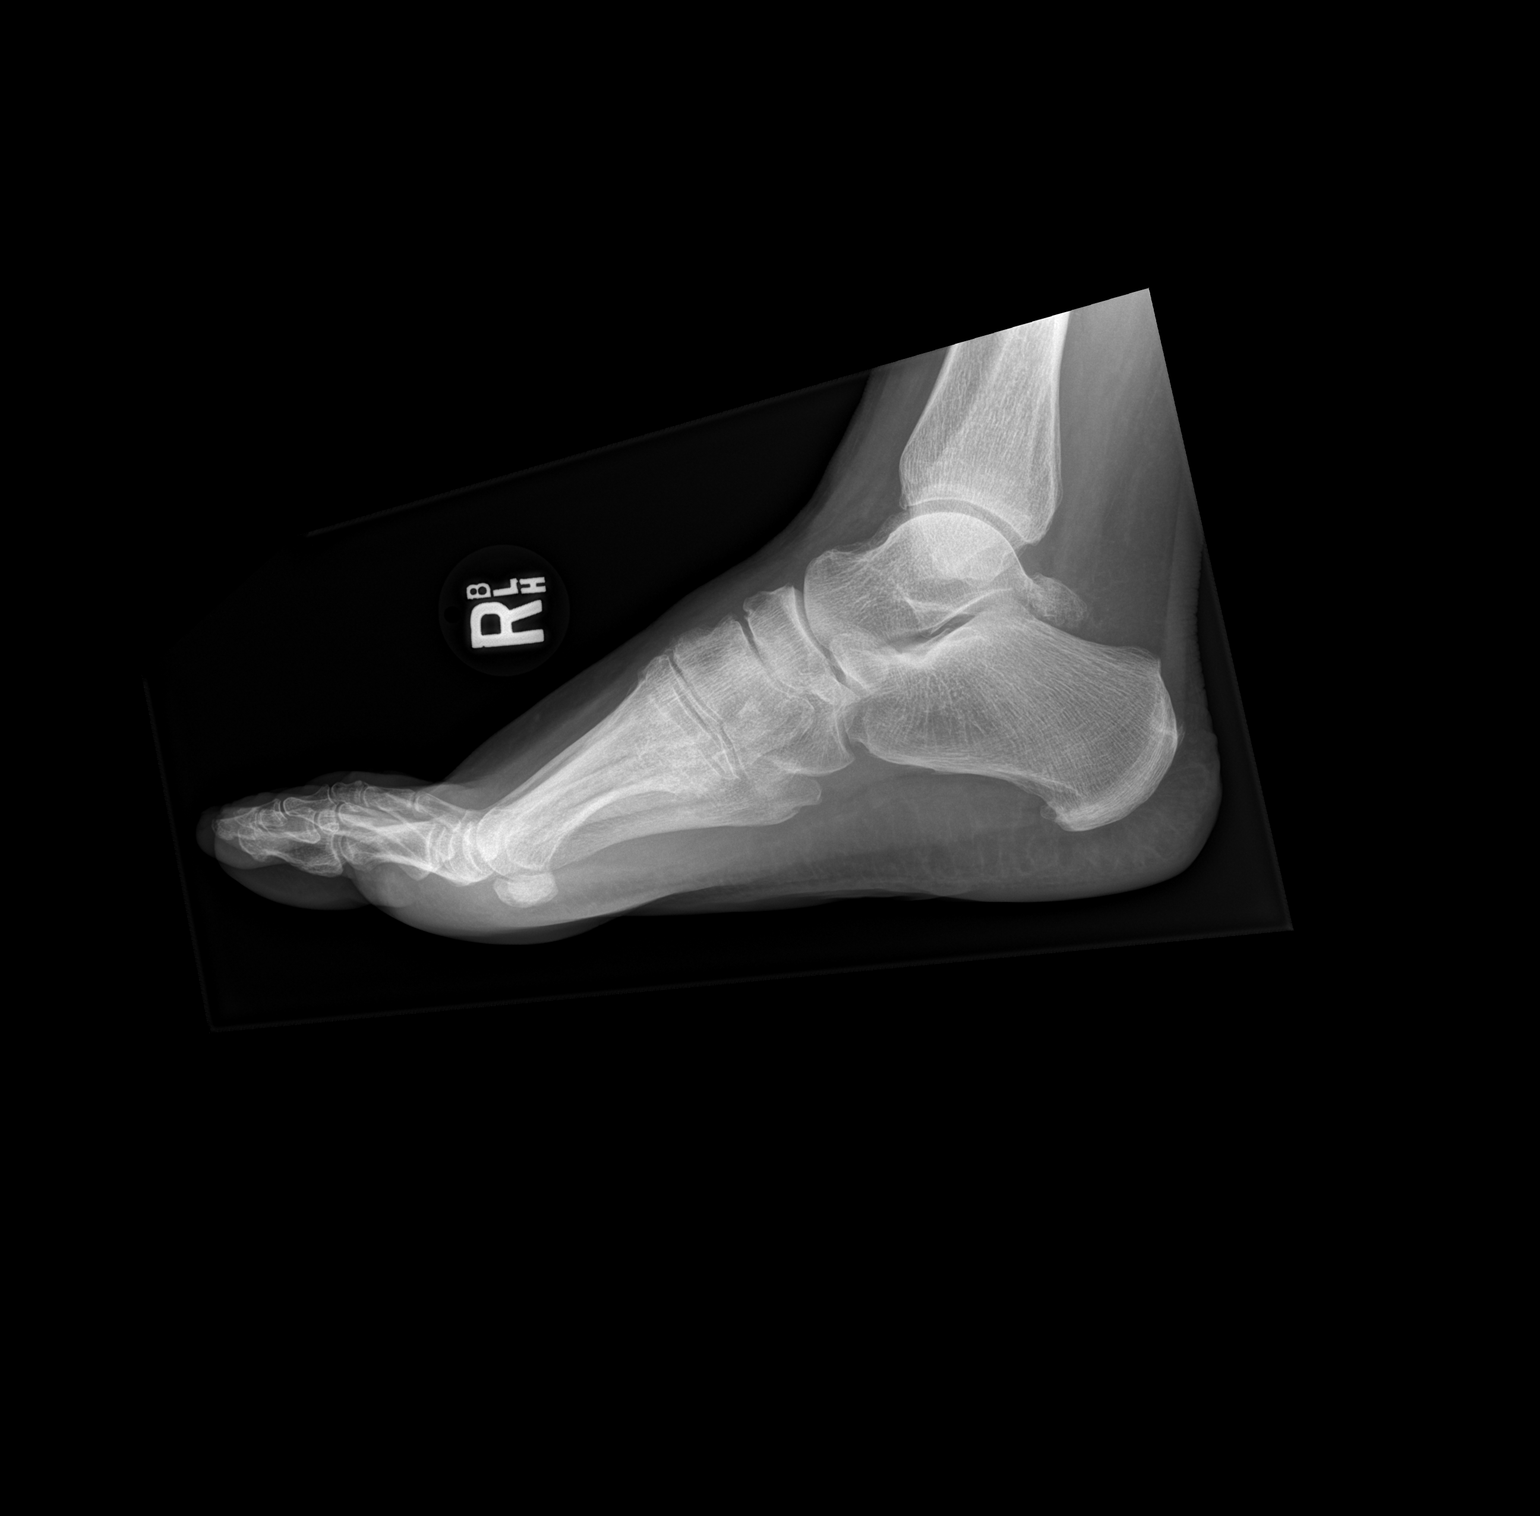

[3 of 3 positions shown; findings below may reference images not displayed]

FINDINGS: There is no evidence of fracture or dislocation. There is no
evidence of arthropathy or other focal bone abnormality. Soft
tissues are unremarkable.
IMPRESSION: Negative.

## 2021-02-06 ENCOUNTER — Other Ambulatory Visit: Payer: Self-pay

## 2021-02-06 ENCOUNTER — Encounter (HOSPITAL_COMMUNITY): Payer: Self-pay

## 2021-02-06 ENCOUNTER — Emergency Department (HOSPITAL_COMMUNITY)
Admission: EM | Admit: 2021-02-06 | Discharge: 2021-02-06 | Disposition: A | Discharge status: Home / Self Care | Payer: Medicaid Other | Attending: Emergency Medicine | Admitting: Emergency Medicine

## 2021-02-06 DIAGNOSIS — I10 Essential (primary) hypertension: Secondary | ICD-10-CM | POA: Insufficient documentation

## 2021-02-06 DIAGNOSIS — Z7951 Long term (current) use of inhaled steroids: Secondary | ICD-10-CM | POA: Diagnosis not present

## 2021-02-06 DIAGNOSIS — Z79899 Other long term (current) drug therapy: Secondary | ICD-10-CM | POA: Diagnosis not present

## 2021-02-06 DIAGNOSIS — J441 Chronic obstructive pulmonary disease with (acute) exacerbation: Secondary | ICD-10-CM | POA: Insufficient documentation

## 2021-02-06 DIAGNOSIS — R21 Rash and other nonspecific skin eruption: Secondary | ICD-10-CM | POA: Diagnosis not present

## 2021-02-06 DIAGNOSIS — F17210 Nicotine dependence, cigarettes, uncomplicated: Secondary | ICD-10-CM | POA: Insufficient documentation

## 2021-02-06 HISTORY — DX: Bipolar disorder, unspecified: F31.9

## 2021-02-06 HISTORY — DX: Depression, unspecified: F32.A

## 2021-02-06 MED ORDER — PREDNISONE 50 MG PO TABS
ORAL_TABLET | ORAL | 0 refills | Status: DC
Start: 2021-02-06 — End: 2021-08-30

## 2021-02-06 NOTE — ED Provider Notes (Signed)
[facility]-EMERGENCY DEPT Provider Note   CSN: [id number] Arrival date & time: 02/06/21  1352     History Chief Complaint  Patient presents with   Rash    Melissa Dickerson is a 50 y.o. female.  50 year old female presents with several week history of rash to her bilateral palms of her hands and soles of her feet.  Notes that occasionally has been pruritic.  States that she is had vesicles with clear drainage but no fever.  No purulent drainage noted.  Does have a cat at home.  Denies any new chemical exposures.  No involvement to the rest of her body.  No oral mucosal involvement.  No treatment use prior to arrival      Past Medical History:  Diagnosis Date   Bipolar 1 disorder (HCC)    COPD (chronic obstructive pulmonary disease) (HCC)    Depression    Hypertension    Schizoaffective disorder [facility])     Patient Active Problem List   Diagnosis Date Noted   Major depressive disorder, recurrent severe without psychotic features (HCC) 05/26/2018   Tobacco abuse 12/10/2016   GERD (gastroesophageal reflux disease) 12/10/2016   HTN (hypertension) 12/05/2016   COPD exacerbation (HCC) 12/04/2016   Schizoaffective disorder, depressive type (HCC) 03/12/2016    Past Surgical History:  Procedure Laterality Date   CHOLECYSTECTOMY       OB History   No obstetric history on file.     Family History  Problem Relation Age of Onset   Cancer Mother     Social History   Tobacco Use   Smoking status: Every Day    Packs/day: 0.50    Years: 33.00    Pack years: 16.50    Types: Cigarettes   Smokeless tobacco: Never  Vaping Use   Vaping Use: Never used  Substance Use Topics   Alcohol use: Not Currently   Drug use: Not Currently    Types: "Crack" cocaine    Comment: 2 months last use     Home Medications Prior to Admission medications   Medication Sig Start Date End Date Taking? Authorizing Provider  ADVAIR DISKUS 100-50 MCG/DOSE AEPB Inhale 1 puff  into the lungs every 12 (twelve) hours. 10/10/19   Harris, Cammy Copa, PA-C  albuterol (VENTOLIN HFA) 108 (90 Base) MCG/ACT inhaler Inhale 1 puff into the lungs 3 (three) times daily as needed for shortness of breath or wheezing. 10/10/19   Harris, Abigail, PA-C  ARIPiprazole (ABILIFY) 15 MG tablet Take 1 tablet (15 mg total) by mouth daily. 10/10/19   Harris, Abigail, PA-C  azithromycin (ZITHROMAX Z-PAK) 250 MG tablet Take 1 tablet (250 mg total) by mouth daily. 500mg  PO day 1, then 250mg  PO days 205 10/10/19   Harris, , PA-C  benztropine (COGENTIN) 1 MG tablet Take 1 tablet (1 mg total) by mouth at bedtime. 10/10/19   Cammy Copa, PA-C  divalproex (DEPAKOTE) 250 MG DR tablet Take 1 tablet (250 mg total) by mouth 2 (two) times daily. 10/10/19   Harris, Arthor Captain, PA-C  gabapentin (NEURONTIN) 300 MG capsule Take 1 capsule (300 mg total) by mouth at bedtime. 10/10/19   Harris, Abigail, PA-C  ipratropium-albuterol (DUONEB) 0.5-2.5 (3) MG/3ML SOLN Take 3 mLs by nebulization every 6 (six) hours as needed. 10/10/19   Harris, 10/12/19, PA-C  lisinopril-hydrochlorothiazide (ZESTORETIC) 10-12.5 MG tablet Take 1 tablet by mouth daily. 10/10/19   Cammy Copa, PA-C  meloxicam (MOBIC) 7.5 MG tablet Take 7.5 mg by mouth daily. 07/22/19   [provider]  methylPREDNISolone (MEDROL DOSEPAK) 4 MG TBPK tablet Use as directed 10/10/19   Arthor Captain, PA-C  montelukast (SINGULAIR) 10 MG tablet Take 1 tablet (10 mg total) by mouth daily. 10/10/19   Arthor Captain, PA-C  Multiple Vitamin (MULTIVITAMIN WITH MINERALS) TABS tablet Take 1 tablet by mouth daily. 05/31/18   Malvin Johns, MD  tiZANidine (ZANAFLEX) 4 MG tablet Take 4 mg by mouth 2 (two) times daily as needed for muscle spasms. 07/22/19   [provider]  Vitamin D, Ergocalciferol, (DRISDOL) 1.25 MG (50000 UNIT) CAPS capsule Take 1 capsule (50,000 Units total) by mouth once a week. 10/10/19   Arthor Captain, PA-C    Allergies    Haldol  [haloperidol], Pork-derived products, and Prozac [fluoxetine hcl]  Review of Systems   Review of Systems  All other systems reviewed and are negative.  Physical Exam Updated Vital Signs BP (!) 128/102 (BP Location: Left Arm)   Pulse 96   Temp 98.6 F (37 C) (Oral)   Resp 18   Ht 1.676 m (5\' 6" )   Wt 136.1 kg   LMP 01/23/2021 (Approximate)   SpO2 96%   BMI 48.42 kg/m   Physical Exam Vitals and nursing note reviewed.  Constitutional:      Appearance: She is well-developed. She is not toxic-appearing.  HENT:     Head: Normocephalic and atraumatic.     Comments: No oral mucosal involvement Eyes:     Conjunctiva/sclera: Conjunctivae normal.     Pupils: Pupils are equal, round, and reactive to light.  Cardiovascular:     Rate and Rhythm: Normal rate.  Pulmonary:     Effort: Pulmonary effort is normal.  Musculoskeletal:     Cervical back: Normal range of motion.  Skin:    General: Skin is warm and dry.     Findings: Rash present. Rash is macular and scaling.     Comments: Rash appreciated to palms of hands and soles of feet bilaterally.  Neurological:     Mental Status: She is alert and oriented to person, place, and time.    ED Results / Procedures / Treatments   Labs (all labs ordered are listed, but only abnormal results are displayed) Labs Reviewed - No data to display  EKG None  Radiology No results found.  Procedures Procedures   Medications Ordered in ED Medications - No data to display  ED Course  I have reviewed the triage vital signs and the nursing notes.  Pertinent labs & imaging results that were available during my care of the patient were reviewed by me and considered in my medical decision making (see chart for details).    MDM Rules/Calculators/A&P                            The patient has not had a contact dermatitis.  Could also be a viral process.  It has been present for at least 3 to 4 weeks.  We will start patient on short  course of prednisone and she has been instructed to follow-up with a dermatologist of her choice.  Does not appear to have any infectious etiology Final Clinical Impression(s) / ED Diagnoses Final diagnoses:  None    Rx / DC Orders ED Discharge Orders     None        01/25/2021, MD 02/06/21 [phone]

## 2021-02-06 NOTE — Discharge Instructions (Signed)
The rash that you have appears to be allergic in origin and does not appear to be infectious at this time.  Follow-up with a dermatologist of your choice and take the prednisone as directed

## 2021-02-06 NOTE — ED Triage Notes (Signed)
Patient reports that she has had "bumps with pus coming out of them" on bilateral palms and feet x 2 weeks. Patient also reports that she has a clear drainage from her left eye x 2 weeks.

## 2021-02-27 ENCOUNTER — Other Ambulatory Visit: Payer: Self-pay

## 2021-02-27 ENCOUNTER — Emergency Department (HOSPITAL_COMMUNITY)
Admission: EM | Admit: 2021-02-27 | Discharge: 2021-02-28 | Disposition: A | Discharge status: Home / Self Care | Payer: Medicaid Other | Attending: Emergency Medicine | Admitting: Emergency Medicine

## 2021-02-27 ENCOUNTER — Encounter (HOSPITAL_COMMUNITY): Payer: Self-pay

## 2021-02-27 DIAGNOSIS — Z7951 Long term (current) use of inhaled steroids: Secondary | ICD-10-CM | POA: Diagnosis not present

## 2021-02-27 DIAGNOSIS — I10 Essential (primary) hypertension: Secondary | ICD-10-CM | POA: Insufficient documentation

## 2021-02-27 DIAGNOSIS — Z20822 Contact with and (suspected) exposure to covid-19: Secondary | ICD-10-CM | POA: Insufficient documentation

## 2021-02-27 DIAGNOSIS — F332 Major depressive disorder, recurrent severe without psychotic features: Secondary | ICD-10-CM | POA: Insufficient documentation

## 2021-02-27 DIAGNOSIS — J449 Chronic obstructive pulmonary disease, unspecified: Secondary | ICD-10-CM | POA: Insufficient documentation

## 2021-02-27 DIAGNOSIS — Z79899 Other long term (current) drug therapy: Secondary | ICD-10-CM | POA: Diagnosis not present

## 2021-02-27 DIAGNOSIS — F600 Paranoid personality disorder: Secondary | ICD-10-CM | POA: Diagnosis not present

## 2021-02-27 DIAGNOSIS — F22 Delusional disorders: Secondary | ICD-10-CM

## 2021-02-27 DIAGNOSIS — F251 Schizoaffective disorder, depressive type: Secondary | ICD-10-CM | POA: Insufficient documentation

## 2021-02-27 DIAGNOSIS — R443 Hallucinations, unspecified: Secondary | ICD-10-CM

## 2021-02-27 LAB — RESP PANEL BY RT-PCR (FLU A&B, COVID) ARPGX2
Influenza A by PCR: NEGATIVE
Influenza B by PCR: NEGATIVE
SARS Coronavirus 2 by RT PCR: NEGATIVE

## 2021-02-27 LAB — COMPREHENSIVE METABOLIC PANEL
ALT: 12 U/L (ref 0–44)
AST: 14 U/L — ABNORMAL LOW (ref 15–41)
Albumin: 3.6 g/dL (ref 3.5–5.0)
Alkaline Phosphatase: 68 U/L (ref 38–126)
Anion gap: 4 — ABNORMAL LOW (ref 5–15)
BUN: 13 mg/dL (ref 6–20)
CO2: 29 mmol/L (ref 22–32)
Calcium: 8.7 mg/dL — ABNORMAL LOW (ref 8.9–10.3)
Chloride: 105 mmol/L (ref 98–111)
Creatinine, Ser: 0.75 mg/dL (ref 0.44–1.00)
GFR, Estimated: >60 mL/min (ref >60)
Glucose, Bld: 94 mg/dL (ref 70–99)
Potassium: 4.6 mmol/L (ref 3.5–5.1)
Sodium: 138 mmol/L (ref 135–145)
Total Bilirubin: 0.5 mg/dL (ref 0.3–1.2)
Total Protein: 7 g/dL (ref 6.5–8.1)

## 2021-02-27 LAB — RAPID URINE DRUG SCREEN, HOSP PERFORMED
Amphetamines: NOT DETECTED
Barbiturates: NOT DETECTED
Benzodiazepines: NOT DETECTED
Cocaine: NOT DETECTED
Opiates: NOT DETECTED
Tetrahydrocannabinol: NOT DETECTED

## 2021-02-27 LAB — CBC WITH DIFFERENTIAL/PLATELET
Abs Immature Granulocytes: 0.04 10*3/uL (ref 0.00–0.07)
Basophils Absolute: 0 10*3/uL (ref 0.0–0.1)
Basophils Relative: 0 %
Eosinophils Absolute: 0.2 10*3/uL (ref 0.0–0.5)
Eosinophils Relative: 2 %
HCT: 42.7 % (ref 36.0–46.0)
Hemoglobin: 13.6 g/dL (ref 12.0–15.0)
Immature Granulocytes: 0 %
Lymphocytes Relative: 37 %
Lymphs Abs: 3.3 10*3/uL (ref 0.7–4.0)
MCH: 28.3 pg (ref 26.0–34.0)
MCHC: 31.9 g/dL (ref 30.0–36.0)
MCV: 88.8 fL (ref 80.0–100.0)
Monocytes Absolute: 0.4 10*3/uL (ref 0.1–1.0)
Monocytes Relative: 5 %
Neutro Abs: 5 10*3/uL (ref 1.7–7.7)
Neutrophils Relative %: 56 %
Platelets: 251 10*3/uL (ref 150–400)
RBC: 4.81 MIL/uL (ref 3.87–5.11)
RDW: 14.1 % (ref 11.5–15.5)
WBC: 9 10*3/uL (ref 4.0–10.5)
nRBC: 0 % (ref 0.0–0.2)

## 2021-02-27 LAB — SALICYLATE LEVEL: Salicylate Lvl: <7 mg/dL — ABNORMAL LOW (ref 7.0–30.0)

## 2021-02-27 LAB — ETHANOL: Alcohol, Ethyl (B): <10 mg/dL (ref <10)

## 2021-02-27 LAB — I-STAT BETA HCG BLOOD, ED (MC, WL, AP ONLY): I-stat hCG, quantitative: <5 m[IU]/mL (ref <5)

## 2021-02-27 LAB — ACETAMINOPHEN LEVEL: Acetaminophen (Tylenol), Serum: <10 ug/mL — ABNORMAL LOW (ref 10–30)

## 2021-02-27 NOTE — ED Triage Notes (Signed)
Pt reports hx of schizoaffective disorder and reports running out of her medication recently. Pt denies SI/HI and AH/VH.

## 2021-02-27 NOTE — ED Provider Notes (Signed)
Emergency Medicine Provider Triage Evaluation Note  Cambria Osten , a 51 y.o. female  was evaluated in triage.  Pt complains of psychiatric evaluation.  Patient states she lives in a group living facility and does not feel safe where she lives.  She denies SI and HI however, admits to auditory and visual hallucinations "sometimes".  Patient states she is currently out of her psychiatric medications.  No physical complaints.  Review of Systems  Positive: hallucinations Negative: fever  Physical Exam  BP (!) 192/113 (BP Location: Left Arm) Comment: stated that she is not taking meds.   Pulse 76    Temp 99 F (37.2 C) (Oral)    Resp 16    Ht 5' 5.5" (1.664 m)    Wt 136.1 kg    SpO2 95%    BMI 49.16 kg/m  Gen:   Awake, no distress   Resp:  Normal effort  MSK:   Moves extremities without difficulty  Other:  Tangential speech   Medical Decision Making  Medically screening exam initiated at 3:09 PM.  Appropriate orders placed.  Ranita Fake was informed that the remainder of the evaluation will be completed by another provider, this initial triage assessment does not replace that evaluation, and the importance of remaining in the ED until their evaluation is complete.  Medical clearance labs   Jesusita Oka 02/27/21 1511    Charlynne Pander, MD 02/27/21 2153

## 2021-02-27 NOTE — ED Provider Notes (Signed)
[facility]-EMERGENCY DEPT Provider Note   CSN: [id number] Arrival date & time: 02/27/21  1316     History  Chief Complaint  Patient presents with   Mental Health Problem    Melissa Dickerson is a 51 y.o. female.  Patient is a 51 year old female with past medical history of schizoaffective disorder, hypertension, COPD, GERD.  Patient presenting today for evaluation of auditory and visual hallucinations, paranoia, and feeling unsafe in her current environment.  Patient stays in a group living facility where she rents an apartment.  She feels as though the others in the apartment are out to get her.  She states she is unable to function due to hearing voices and paranoia.  She is requesting to speak with a psychiatrist.  The history is provided by the patient.      Home Medications Prior to Admission medications   Medication Sig Start Date End Date Taking? Authorizing Provider  ADVAIR DISKUS 100-50 MCG/DOSE AEPB Inhale 1 puff into the lungs every 12 (twelve) hours. 10/10/19   Harris, Cammy Copa, PA-C  albuterol (VENTOLIN HFA) 108 (90 Base) MCG/ACT inhaler Inhale 1 puff into the lungs 3 (three) times daily as needed for shortness of breath or wheezing. 10/10/19   Harris, Abigail, PA-C  ARIPiprazole (ABILIFY) 15 MG tablet Take 1 tablet (15 mg total) by mouth daily. 10/10/19   Harris, Abigail, PA-C  azithromycin (ZITHROMAX Z-PAK) 250 MG tablet Take 1 tablet (250 mg total) by mouth daily. 500mg  PO day 1, then 250mg  PO days 205 10/10/19   Harris, , PA-C  benztropine (COGENTIN) 1 MG tablet Take 1 tablet (1 mg total) by mouth at bedtime. 10/10/19   Cammy Copa, PA-C  divalproex (DEPAKOTE) 250 MG DR tablet Take 1 tablet (250 mg total) by mouth 2 (two) times daily. 10/10/19   Harris, Arthor Captain, PA-C  gabapentin (NEURONTIN) 300 MG capsule Take 1 capsule (300 mg total) by mouth at bedtime. 10/10/19   Harris, Abigail, PA-C  ipratropium-albuterol (DUONEB) 0.5-2.5 (3) MG/3ML SOLN Take  3 mLs by nebulization every 6 (six) hours as needed. 10/10/19   Harris, 10/12/19, PA-C  lisinopril-hydrochlorothiazide (ZESTORETIC) 10-12.5 MG tablet Take 1 tablet by mouth daily. 10/10/19   Cammy Copa, PA-C  meloxicam (MOBIC) 7.5 MG tablet Take 7.5 mg by mouth daily. 07/22/19   [provider]  methylPREDNISolone (MEDROL DOSEPAK) 4 MG TBPK tablet Use as directed 10/10/19   07/24/19, PA-C  montelukast (SINGULAIR) 10 MG tablet Take 1 tablet (10 mg total) by mouth daily. 10/10/19   Arthor Captain, PA-C  Multiple Vitamin (MULTIVITAMIN WITH MINERALS) TABS tablet Take 1 tablet by mouth daily. 05/31/18   Arthor Captain, MD  predniSONE (DELTASONE) 50 MG tablet 1 p.o. daily x5 02/06/21   Malvin Johns, MD  tiZANidine (ZANAFLEX) 4 MG tablet Take 4 mg by mouth 2 (two) times daily as needed for muscle spasms. 07/22/19   [provider]  Vitamin D, Ergocalciferol, (DRISDOL) 1.25 MG (50000 UNIT) CAPS capsule Take 1 capsule (50,000 Units total) by mouth once a week. 10/10/19   07/24/19, PA-C      Allergies    Haldol [haloperidol], Pork-derived products, and Prozac [fluoxetine hcl]    Review of Systems   Review of Systems  All other systems reviewed and are negative.  Physical Exam Updated Vital Signs BP (!) 172/120 (BP Location: Right Arm)    Pulse 77    Temp 99 F (37.2 C) (Oral)    Resp 20    Ht 5' 5.5" (  1.664 m)    Wt 136.1 kg    SpO2 97%    BMI 49.16 kg/m  Physical Exam Vitals and nursing note reviewed.  Constitutional:      General: She is not in acute distress.    Appearance: She is well-developed. She is not diaphoretic.  HENT:     Head: Normocephalic and atraumatic.  Cardiovascular:     Rate and Rhythm: Normal rate and regular rhythm.     Heart sounds: No murmur heard.   No friction rub. No gallop.  Pulmonary:     Effort: Pulmonary effort is normal. No respiratory distress.     Breath sounds: Normal breath sounds. No wheezing.  Abdominal:     General: Bowel  sounds are normal. There is no distension.     Palpations: Abdomen is soft.     Tenderness: There is no abdominal tenderness.  Musculoskeletal:        General: Normal range of motion.     Cervical back: Normal range of motion and neck supple.  Skin:    General: Skin is warm and dry.  Neurological:     General: No focal deficit present.     Mental Status: She is alert and oriented to person, place, and time.  Psychiatric:        Attention and Perception: Attention and perception normal.        Mood and Affect: Mood is depressed.        Speech: Speech is delayed.        Behavior: Behavior is withdrawn.        Thought Content: Thought content is paranoid. Thought content does not include homicidal or suicidal ideation. Thought content does not include homicidal or suicidal plan.        Cognition and Memory: Cognition normal.    ED Results / Procedures / Treatments   Labs (all labs ordered are listed, but only abnormal results are displayed) Labs Reviewed  COMPREHENSIVE METABOLIC PANEL - Abnormal; Notable for the following components:      Result Value   Calcium 8.7 (*)    AST 14 (*)    Anion gap 4 (*)    All other components within normal limits  SALICYLATE LEVEL - Abnormal; Notable for the following components:   Salicylate Lvl <7.0 (*)    All other components within normal limits  ACETAMINOPHEN LEVEL - Abnormal; Notable for the following components:   Acetaminophen (Tylenol), Serum <10 (*)    All other components within normal limits  RESP PANEL BY RT-PCR (FLU A&B, COVID) ARPGX2  ETHANOL  RAPID URINE DRUG SCREEN, HOSP PERFORMED  CBC WITH DIFFERENTIAL/PLATELET  I-STAT BETA HCG BLOOD, ED ([facility], [facility], [facility] ONLY)    EKG None  Radiology No results found.  Procedures Procedures    Medications Ordered in ED Medications - No data to display  ED Course/ Medical Decision Making/ A&P  Patient presenting with complaints of hallucinations, paranoia, and feeling unsafe in her  current environment.  Patient requesting to speak with crisis counselor.  She denies to me that she is suicidal or homicidal, but does feel as though the residents in her group living arrangement are out to get her.  Patient is medically cleared to undergo TTS evaluation.  Disposition pending.  Final Clinical Impression(s) / ED Diagnoses Final diagnoses:  None    Rx / DC Orders ED Discharge Orders     None         Geoffery Lyons, MD 02/28/21 [phone]

## 2021-02-28 DIAGNOSIS — F251 Schizoaffective disorder, depressive type: Secondary | ICD-10-CM

## 2021-02-28 MED ORDER — TIZANIDINE HCL 4 MG PO TABS: 4.0000 mg | ORAL_TABLET | Freq: Two times a day (BID) | ORAL | Status: DC | PRN

## 2021-02-28 MED ORDER — DIVALPROEX SODIUM 250 MG PO DR TAB
250.0000 mg | DELAYED_RELEASE_TABLET | Freq: Two times a day (BID) | ORAL | Status: DC
  Administered 2021-02-28: 250 mg via ORAL
  Filled 2021-02-28: qty 1

## 2021-02-28 MED ORDER — IPRATROPIUM-ALBUTEROL 0.5-2.5 (3) MG/3ML IN SOLN: 3.0000 mL | Freq: Four times a day (QID) | RESPIRATORY_TRACT | Status: DC | PRN

## 2021-02-28 MED ORDER — LISINOPRIL-HYDROCHLOROTHIAZIDE 10-12.5 MG PO TABS: 1.0000 | ORAL_TABLET | Freq: Every day | ORAL | Status: DC

## 2021-02-28 MED ORDER — ARIPIPRAZOLE 5 MG PO TABS
15.0000 mg | ORAL_TABLET | Freq: Every day | ORAL | Status: DC
  Administered 2021-02-28: 15 mg via ORAL
  Filled 2021-02-28: qty 1

## 2021-02-28 MED ORDER — BENZTROPINE MESYLATE 1 MG PO TABS: 1.0000 mg | ORAL_TABLET | Freq: Every day | ORAL | Status: DC

## 2021-02-28 MED ORDER — ALBUTEROL SULFATE HFA 108 (90 BASE) MCG/ACT IN AERS: 1.0000 | INHALATION_SPRAY | Freq: Three times a day (TID) | RESPIRATORY_TRACT | Status: DC | PRN

## 2021-02-28 MED ORDER — GABAPENTIN 300 MG PO CAPS: 300.0000 mg | ORAL_CAPSULE | Freq: Every day | ORAL | Status: DC

## 2021-02-28 NOTE — ED Provider Notes (Addendum)
Emergency Medicine Observation Re-evaluation Note  Melissa Dickerson is a 51 y.o. female, seen on rounds today.  Pt initially presented to the ED for complaints of Mental Health Problem Currently, the patient is alert, resting comfortably, stating "it is not safe for me to go home."  Physical Exam  BP (!) 160/102    Pulse 63    Temp 99 F (37.2 C) (Oral)    Resp 19    Ht 5' 5.5" (1.664 m)    Wt 136.1 kg    SpO2 95%    BMI 49.16 kg/m  Physical Exam General: No apparent distress Cardiac: Well perfused Lungs: Respirations even and unlabored Psych: No agitation  ED Course / MDM  EKG:   I have reviewed the labs performed to date as well as medications administered while in observation.  Recent changes in the last 24 hours include patient presented to the emergency department with racing thoughts, evaluated by psychiatry overnight and deemed to need reevaluation for safety and stabilization with psych reassessment in the a.m. The patient's home medications were reordered.  Plan  Current plan is for pending TTS reevaluation this morning.  Iman Maiden is not under involuntary commitment.     Ernie Avena, MD 02/28/21 [id number]    Ernie Avena, MD 02/28/21 [phone]

## 2021-02-28 NOTE — [facility] Assessment (Signed)
[facility] Assessment Progress Note   Per Caryn Bee, NP, this voluntary pt does not require psychiatric hospitalization at this time.  Pt is psychiatrically cleared.  Discharge instructions advise pt to continue treatment with [facility].  EDP Ernie Avena, MD and pt's nurse, Abby, have been notified.  Doylene Canning, MA Triage Specialist [phone]
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
## 2021-02-28 NOTE — Consult Note (Signed)
[facility] Psych ED Discharge  02/28/2021 10:05 AM Melissa Dickerson  MRN:  [id number]  Method of visit?: Face to Face   Principal Problem: Schizoaffective disorder, depressive type [facility]) Discharge Diagnoses: Principal Problem:   Schizoaffective disorder, depressive type ([place]) Active Problems:   Major depressive disorder, recurrent severe without psychotic features ([place])  Subjective: Patient seen and assessed, case discussed with Dr. Dwyane Dee.  Patient is alert and oriented, calm and cooperative, very pleasant during reassessment.  Patient is observed to be sitting upright, recently completed her breakfast and brightens upon approach.  Patient states she is doing much better, since resuming her medication, and feels ready to go.  Patient does inquire about her current services she is receiving at [facility] behavioral health, and would like to continue to receive services here.  She does report compliance of her medication during this emergency room visit, and has an appointment with [facility] that she plans to keep.  She currently denies any current suicidal ideations, homicidal ideations, and or auditory or visual hallucinations.  She does not appear to be responding to internal stimuli, external stimuli, and or exhibiting delusional thought disorder.  She does not appear to have any acute psychosis and or otherwise overt psychiatric symptoms, that will require further assessment and or inpatient hospitalization.  Patient will be psychiatrically cleared at this time.  HPI: Melissa Dickerson is a 51 year old female presenting voluntary to [facility] due to requesting medication refill. Patient denies SI, HI and alcohol/drug usage. When asked, why are you here, patient stated, "I need stabilization on medication, I need refill, I am not stable to go outside, I am disoriented, my thoughts are racing, I am not making rash decisions, I don't know date, time, can't function in daily life, can't stick to routine". Patient reported  onset "long time" and that she has been on disability since 1993. Patient reported visual hallucinations of of seeing flashes and shadows and things running past her. Patient reported worsening depressive symptoms. Patient reported multiple psych hospitalizations when she lived in [place] in 1993. Patient denied past suicide attempts and self-harming behaviors.    Patient is currently being seen by [facility], unable to recall providers names. Patient stated her medications are not working. Patient unsure of when the last time she was seen.    Patient reported that she was homeless, however she reported to EDP that she lives in a group living facility and does not feel safe where she lives. Patient denied collateral contact.  Total Time spent with patient: 30 minutes  Past Psychiatric History: MDD  Past Medical History:  Past Medical History:  Diagnosis Date   Bipolar 1 disorder ([place])    COPD (chronic obstructive pulmonary disease) ([place])    Depression    Hypertension    Schizoaffective disorder ([place])     Past Surgical History:  Procedure Laterality Date   CHOLECYSTECTOMY     Family History:  Family History  Problem Relation Age of Onset   Cancer Mother    Family Psychiatric  History:  Social History:  Social History   Substance and Sexual Activity  Alcohol Use Not Currently     Social History   Substance and Sexual Activity  Drug Use Not Currently   Types: "Crack" cocaine   Comment: 2 months last use     Social History   Socioeconomic History   Marital status: Divorced    Spouse name: Not on file   Number of children: Not on file   Years of education:  Not on file   Highest education level: Not on file  Occupational History   Not on file  Tobacco Use   Smoking status: Every Day    Packs/day: 0.50    Years: 33.00    Pack years: 16.50    Types: Cigarettes   Smokeless tobacco: Never  Vaping Use   Vaping Use: Never used  Substance and Sexual Activity   Alcohol  use: Not Currently   Drug use: Not Currently    Types: "Crack" cocaine    Comment: 2 months last use    Sexual activity: Not Currently  Other Topics Concern   Not on file  Social History Narrative   Pt lives at a group home; receives outpatient services from [place] Determinants of Health   Financial Resource Strain: Not on file  Food Insecurity: Not on file  Transportation Needs: Not on file  Physical Activity: Not on file  Stress: Not on file  Social Connections: Not on file    Tobacco Cessation:  N/A, patient does not currently use tobacco products  Current Medications: Current Facility-Administered Medications  Medication Dose Route Frequency Provider Last Rate Last Admin   albuterol (VENTOLIN HFA) 108 (90 Base) MCG/ACT inhaler 1 puff  1 puff Inhalation TID PRN Regan Lemming, MD       ARIPiprazole (ABILIFY) tablet 15 mg  15 mg Oral Daily Regan Lemming, MD   15 mg at 02/28/21 0932   benztropine (COGENTIN) tablet 1 mg  1 mg Oral QHS Regan Lemming, MD       divalproex (DEPAKOTE) DR tablet 250 mg  250 mg Oral BID Regan Lemming, MD   250 mg at 02/28/21 0932   gabapentin (NEURONTIN) capsule 300 mg  300 mg Oral QHS Regan Lemming, MD       ipratropium-albuterol (DUONEB) 0.5-2.5 (3) MG/3ML nebulizer solution 3 mL  3 mL Nebulization Q6H PRN Regan Lemming, MD       lisinopril-hydrochlorothiazide (ZESTORETIC) 10-12.5 MG per tablet 1 tablet  1 tablet Oral Daily Regan Lemming, MD       tiZANidine (ZANAFLEX) tablet 4 mg  4 mg Oral BID PRN Regan Lemming, MD       Current Outpatient Medications  Medication Sig Dispense Refill   ADVAIR DISKUS 100-50 MCG/DOSE AEPB Inhale 1 puff into the lungs every 12 (twelve) hours. 60 each 0   albuterol (VENTOLIN HFA) 108 (90 Base) MCG/ACT inhaler Inhale 1 puff into the lungs 3 (three) times daily as needed for shortness of breath or wheezing. 18 g 0   ARIPiprazole (ABILIFY) 15 MG tablet Take 1 tablet (15 mg total) by mouth daily. 30 tablet  0   azithromycin (ZITHROMAX Z-PAK) 250 MG tablet Take 1 tablet (250 mg total) by mouth daily. 500mg  PO day 1, then 250mg  PO days 205 6 tablet 0   benztropine (COGENTIN) 1 MG tablet Take 1 tablet (1 mg total) by mouth at bedtime. 30 tablet 0   divalproex (DEPAKOTE) 250 MG DR tablet Take 1 tablet (250 mg total) by mouth 2 (two) times daily. 60 tablet 0   gabapentin (NEURONTIN) 300 MG capsule Take 1 capsule (300 mg total) by mouth at bedtime. 30 capsule 0   ipratropium-albuterol (DUONEB) 0.5-2.5 (3) MG/3ML SOLN Take 3 mLs by nebulization every 6 (six) hours as needed. 360 mL 0   lisinopril-hydrochlorothiazide (ZESTORETIC) 10-12.5 MG tablet Take 1 tablet by mouth daily. 30 tablet 0   meloxicam (MOBIC) 7.5 MG tablet Take 7.5 mg by mouth daily.  methylPREDNISolone (MEDROL DOSEPAK) 4 MG TBPK tablet Use as directed 21 tablet 0   montelukast (SINGULAIR) 10 MG tablet Take 1 tablet (10 mg total) by mouth daily. 30 tablet 0   Multiple Vitamin (MULTIVITAMIN WITH MINERALS) TABS tablet Take 1 tablet by mouth daily. 90 tablet 1   predniSONE (DELTASONE) 50 MG tablet 1 p.o. daily x5 5 tablet 0   tiZANidine (ZANAFLEX) 4 MG tablet Take 4 mg by mouth 2 (two) times daily as needed for muscle spasms.     Vitamin D, Ergocalciferol, (DRISDOL) 1.25 MG (50000 UNIT) CAPS capsule Take 1 capsule (50,000 Units total) by mouth once a week. 5 capsule 0   PTA Medications: (Not in a hospital admission)   Musculoskeletal: Strength & Muscle Tone: within normal limits Gait & Station: normal Patient leans: N/A  Psychiatric Specialty Exam:  Presentation  General Appearance: Appropriate for Environment; Casual Eye Contact:Fair Speech:Clear and Coherent; Normal Rate Speech Volume:Normal Handedness:Right  Mood and Affect  Mood:Euthymic Affect:Congruent; Appropriate  Thought Process  Thought Processes:Coherent; Linear Descriptions of Associations:Intact  Orientation:Full (Time, Place and Person)  Thought  Content:Logical  History of Schizophrenia/Schizoaffective disorder:No data recorded Duration of Psychotic Symptoms:No data recorded Hallucinations:Hallucinations: None  Ideas of Reference:None  Suicidal Thoughts:Suicidal Thoughts: No  Homicidal Thoughts:Homicidal Thoughts: No   Sensorium  Memory:Immediate Good; Recent Good; Remote Good Judgment:Fair Insight:Fair  Executive Functions  Concentration:Fair Attention Span:Fair [place]  Psychomotor Activity  Psychomotor Activity:Psychomotor Activity: Normal  Assets  Assets:Communication Skills; Desire for Improvement; Financial Resources/Insurance; Physical Health; Leisure Time; Social Support  Sleep  Sleep:Sleep: Fair   Physical Exam: Physical Exam ROS Blood pressure (!) 174/95, pulse 62, temperature 99 F (37.2 C), temperature source Oral, resp. rate 16, height 5' 5.5" (1.664 m), weight 136.1 kg, SpO2 95 %. Body mass index is 49.16 kg/m.   Demographic Factors:  Caucasian, Low socioeconomic status, and Unemployed  Loss Factors: NA  Historical Factors: Family history of suicide, Family history of mental illness or substance abuse, and Impulsivity  Risk Reduction Factors:   Sense of responsibility to family, Living with another person, especially a relative, Positive social support, Positive therapeutic relationship, and Positive coping skills or problem solving skills  Continued Clinical Symptoms:  More than one psychiatric diagnosis Unstable or Poor Therapeutic Relationship Previous Psychiatric Diagnoses and Treatments Medical Diagnoses and Treatments/Surgeries  Cognitive Features That Contribute To Risk:  None    Suicide Risk:  Minimal: No identifiable suicidal ideation.  Patients presenting with no risk factors but with morbid ruminations; may be classified as minimal risk based on the severity of the depressive symptoms    Plan Of Care/Follow-up  recommendations:  Other:  Psych cleared at this time. Continue current medications at this time.    Disposition: Psych cleared, follow up with [facility].   Suella Broad, FNP 02/28/2021, 10:05 AM

## 2021-02-28 NOTE — ED Notes (Signed)
TTS at bedside talking with pt. 

## 2021-02-28 NOTE — ED Notes (Signed)
Pt has been wanded down by security.

## 2021-02-28 NOTE — ED Notes (Addendum)
Pt has one personal belongings bag and a black backpack. Both bags are located behind the nursing station in the cabinets labelled [address].

## 2021-02-28 NOTE — Discharge Instructions (Signed)
For your mental health needs, you are advised to continue treatment with [facility]:       [facility]      [address]      [place], [place] [address]      [phone]   

## 2021-07-12 ENCOUNTER — Encounter: Payer: Self-pay | Admitting: Critical Care Medicine

## 2021-07-12 ENCOUNTER — Other Ambulatory Visit: Payer: Self-pay

## 2021-07-12 ENCOUNTER — Other Ambulatory Visit: Payer: Self-pay | Admitting: Critical Care Medicine

## 2021-07-12 ENCOUNTER — Encounter (HOSPITAL_COMMUNITY): Payer: Self-pay

## 2021-07-12 MED ORDER — FLUTICASONE-SALMETEROL 250-50 MCG/ACT IN AEPB
1.0000 | INHALATION_SPRAY | Freq: Two times a day (BID) | RESPIRATORY_TRACT | 6 refills | Status: DC
  Filled 2021-07-12: qty 60, 30d supply, fill #0

## 2021-07-12 MED ORDER — MONTELUKAST SODIUM 10 MG PO TABS
10.0000 mg | ORAL_TABLET | Freq: Every day | ORAL | 0 refills | Status: DC
  Filled 2021-07-12: qty 30, 30d supply, fill #0

## 2021-07-12 MED ORDER — ARIPIPRAZOLE 15 MG PO TABS
15.0000 mg | ORAL_TABLET | Freq: Every day | ORAL | 0 refills | Status: DC
  Filled 2021-07-12: qty 30, 30d supply, fill #0

## 2021-07-12 MED ORDER — BENZTROPINE MESYLATE 1 MG PO TABS
1.0000 mg | ORAL_TABLET | Freq: Every day | ORAL | 0 refills | Status: DC
  Filled 2021-07-12: qty 30, 30d supply, fill #0

## 2021-07-12 MED ORDER — DIVALPROEX SODIUM 250 MG PO DR TAB
250.0000 mg | DELAYED_RELEASE_TABLET | Freq: Two times a day (BID) | ORAL | 0 refills | Status: DC
  Filled 2021-07-12: qty 60, 30d supply, fill #0

## 2021-07-12 MED ORDER — ALBUTEROL SULFATE HFA 108 (90 BASE) MCG/ACT IN AERS
1.0000 | INHALATION_SPRAY | Freq: Three times a day (TID) | RESPIRATORY_TRACT | 0 refills | Status: DC | PRN
  Filled 2021-07-12: qty 18, 25d supply, fill #0

## 2021-07-12 MED ORDER — VALSARTAN-HYDROCHLOROTHIAZIDE 160-25 MG PO TABS
1.0000 | ORAL_TABLET | Freq: Every day | ORAL | 3 refills | Status: DC
  Filled 2021-07-12: qty 30, 30d supply, fill #0

## 2021-07-13 ENCOUNTER — Other Ambulatory Visit: Payer: Self-pay

## 2021-07-13 NOTE — Progress Notes (Unsigned)
This is a 51 year old female who is here at the [facility] shelter has just arrived for 1 day.  She has been homeless for several years precipitated by mental health and drug use issues.  She had been with [facility] and got disconnected from [facility].  She does have full Medicaid.  She lost all her medications and transition and needs her medications refilled.  She does have underlying COPD.    Patient is an active smoker.  On exam blood pressure 159/92 saturation 96%  Chest shows expiratory wheezes  Patient is moderately obese  There is no peripheral edema  Impression here is that of hypertension COPD reflux schizoaffective disorder and depression and tobacco use  Plan is to refill albuterol inhaler to take as needed, Abilify 15 mg daily, Cogentin 1 mg daily, Depakote 250 mg twice daily, Advair 1 puff twice daily to 50 strength, montelukast 10 mg daily, valsartan HCT 160/25.  We were able to retrieve these medications for the patient today as she has a very low co-pay this was placed on account  We will be able to get this patient into [facility] to establish for primary care

## 2021-08-01 ENCOUNTER — Ambulatory Visit: Payer: Self-pay | Admitting: Critical Care Medicine

## 2021-08-01 NOTE — Progress Notes (Incomplete)
New Patient Office Visit  Subjective    Patient ID: Melissa Dickerson, female    DOB: 1971/02/23  Age: 51 y.o. MRN: [id number]  CC: No chief complaint on file.   HPI 08/01/21 Mammo/pap /tap/colon hcv  Melissa Dickerson presents to establish care This is a 51 year old female who is here at the [facility] shelter has just arrived for 1 day.  She has been homeless for several years precipitated by mental health and drug use issues.  She had been with [facility] and got disconnected from [place].  She does have full Medicaid.  She lost all her medications and transition and needs her medications refilled.  She does have underlying COPD.     Patient is an active smoker.  On exam blood pressure 159/92 saturation 96%   Chest shows expiratory wheezes   Patient is moderately obese   There is no peripheral edema   Impression here is that of hypertension COPD reflux schizoaffective disorder and depression and tobacco use   Plan is to refill albuterol inhaler to take as needed, Abilify 15 mg daily, Cogentin 1 mg daily, Depakote 250 mg twice daily, Advair 1 puff twice daily to 50 strength, montelukast 10 mg daily, valsartan HCT 160/25.  We were able to retrieve these medications for the patient today as she has a very low co-pay this was placed on account    Outpatient Encounter Medications as of 08/01/2021  Medication Sig  . albuterol (VENTOLIN HFA) 108 (90 Base) MCG/ACT inhaler Inhale 1 puff into the lungs 3 (three) times daily as needed for shortness of breath or wheezing.  . ARIPiprazole (ABILIFY) 15 MG tablet Take 1 tablet (15 mg total) by mouth daily.  . benztropine (COGENTIN) 1 MG tablet Take 1 tablet (1 mg total) by mouth at bedtime.  . divalproex (DEPAKOTE) 250 MG DR tablet Take 1 tablet (250 mg total) by mouth 2 (two) times daily.  . fluticasone-salmeterol (ADVAIR) 250-50 MCG/ACT AEPB Inhale 1 puff into the lungs 2 (two) times daily.  Marland Kitchen gabapentin (NEURONTIN) 300 MG capsule Take 1 capsule (300 mg  total) by mouth at bedtime.  Marland Kitchen ipratropium-albuterol (DUONEB) 0.5-2.5 (3) MG/3ML SOLN Take 3 mLs by nebulization every 6 (six) hours as needed.  . meloxicam (MOBIC) 7.5 MG tablet Take 7.5 mg by mouth daily.  . montelukast (SINGULAIR) 10 MG tablet Take 1 tablet (10 mg total) by mouth daily.  . Multiple Vitamin (MULTIVITAMIN WITH MINERALS) TABS tablet Take 1 tablet by mouth daily.  . predniSONE (DELTASONE) 50 MG tablet 1 p.o. daily x5  . tiZANidine (ZANAFLEX) 4 MG tablet Take 4 mg by mouth 2 (two) times daily as needed for muscle spasms.  . valsartan-hydrochlorothiazide (DIOVAN-HCT) 160-25 MG tablet Take 1 tablet by mouth daily.  . Vitamin D, Ergocalciferol, (DRISDOL) 1.25 MG (50000 UNIT) CAPS capsule Take 1 capsule (50,000 Units total) by mouth once a week.   No facility-administered encounter medications on file as of 08/01/2021.    Past Medical History:  Diagnosis Date  . Bipolar 1 disorder (HCC)   . COPD (chronic obstructive pulmonary disease) (HCC)   . Depression   . Hypertension   . Schizoaffective disorder [facility])     Past Surgical History:  Procedure Laterality Date  . CHOLECYSTECTOMY      Family History  Problem Relation Age of Onset  . Cancer Mother     Social History   Socioeconomic History  . Marital status: Divorced    Spouse name: Not on file  . Number of  children: Not on file  . Years of education: Not on file  . Highest education level: Not on file  Occupational History  . Not on file  Tobacco Use  . Smoking status: Every Day    Packs/day: 0.50    Years: 33.00    Pack years: 16.50    Types: Cigarettes  . Smokeless tobacco: Never  Vaping Use  . Vaping Use: Never used  Substance and Sexual Activity  . Alcohol use: Not Currently  . Drug use: Not Currently    Types: "Crack" cocaine    Comment: 2 months last use   . Sexual activity: Not Currently  Other Topics Concern  . Not on file  Social History Narrative   Pt lives at a group home; receives  outpatient services from [place]   Social Determinants of Health   Financial Resource Strain: Not on file  Food Insecurity: Not on file  Transportation Needs: Not on file  Physical Activity: Not on file  Stress: Not on file  Social Connections: Not on file  Intimate Partner Violence: Not on file    ROS      Objective    There were no vitals taken for this visit.  Physical Exam  {Labs (Optional):23779}    Assessment & Plan:   Problem List Items Addressed This Visit   None   No follow-ups on file.   Shan Levans, MD

## 2021-08-15 ENCOUNTER — Ambulatory Visit: Payer: Medicaid Other | Admitting: Critical Care Medicine

## 2021-08-15 NOTE — Progress Notes (Deleted)
New Patient Office Visit  Subjective    Patient ID: Melissa Dickerson, female    DOB: [birth year]  Age: 51 y.o. MRN: [id number]  CC: No chief complaint on file.   HPI [name] presents to establish care  06/2021 [facility] This is a 52 year old female who is here at the [place] shelter has just arrived for 1 day.  She has been homeless for several years precipitated by mental health and drug use issues.  She had been with [facility] and got disconnected from [place].  She does have full Medicaid.  She lost all her medications and transition and needs her medications refilled.  She does have underlying COPD.     Patient is an active smoker.  On exam blood pressure 159/92 saturation 96%   Chest shows expiratory wheezes   Patient is moderately obese   There is no peripheral edema   Impression here is that of hypertension COPD reflux schizoaffective disorder and depression and tobacco use   Plan is to refill albuterol inhaler to take as needed, Abilify 15 mg daily, Cogentin 1 mg daily, Depakote 250 mg twice daily, Advair 1 puff twice daily to 50 strength, montelukast 10 mg daily, valsartan HCT 160/25.  We were able to retrieve these medications for the patient today as she has a very low co-pay this was placed on account    Outpatient Encounter Medications as of 08/15/2021  Medication Sig   albuterol (VENTOLIN HFA) 108 (90 Base) MCG/ACT inhaler Inhale 1 puff into the lungs 3 (three) times daily as needed for shortness of breath or wheezing.   ARIPiprazole (ABILIFY) 15 MG tablet Take 1 tablet (15 mg total) by mouth daily.   benztropine (COGENTIN) 1 MG tablet Take 1 tablet (1 mg total) by mouth at bedtime.   divalproex (DEPAKOTE) 250 MG DR tablet Take 1 tablet (250 mg total) by mouth 2 (two) times daily.   fluticasone-salmeterol (ADVAIR) 250-50 MCG/ACT AEPB Inhale 1 puff into the lungs 2 (two) times daily.   gabapentin (NEURONTIN) 300 MG capsule Take 1 capsule (300 mg total) by mouth  at bedtime.   ipratropium-albuterol (DUONEB) 0.5-2.5 (3) MG/3ML SOLN Take 3 mLs by nebulization every 6 (six) hours as needed.   meloxicam (MOBIC) 7.5 MG tablet Take 7.5 mg by mouth daily.   montelukast (SINGULAIR) 10 MG tablet Take 1 tablet (10 mg total) by mouth daily.   Multiple Vitamin (MULTIVITAMIN WITH MINERALS) TABS tablet Take 1 tablet by mouth daily.   predniSONE (DELTASONE) 50 MG tablet 1 p.o. daily x5   tiZANidine (ZANAFLEX) 4 MG tablet Take 4 mg by mouth 2 (two) times daily as needed for muscle spasms.   valsartan-hydrochlorothiazide (DIOVAN-HCT) 160-25 MG tablet Take 1 tablet by mouth daily.   Vitamin D, Ergocalciferol, (DRISDOL) 1.25 MG (50000 UNIT) CAPS capsule Take 1 capsule (50,000 Units total) by mouth once a week.   No facility-administered encounter medications on file as of 08/15/2021.    Past Medical History:  Diagnosis Date   Bipolar 1 disorder (HCC)    COPD (chronic obstructive pulmonary disease) (HCC)    Depression    Hypertension    Schizoaffective disorder (HCC)     Past Surgical History:  Procedure Laterality Date   CHOLECYSTECTOMY      Family History  Problem Relation Age of Onset   Cancer Mother     Social History   Socioeconomic History   Marital status: Divorced    Spouse name: Not on file   Number of children:  Not on file   Years of education: Not on file   Highest education level: Not on file  Occupational History   Not on file  Tobacco Use   Smoking status: Every Day    Packs/day: 0.50    Years: 33.00    Total pack years: 16.50    Types: Cigarettes   Smokeless tobacco: Never  Vaping Use   Vaping Use: Never used  Substance and Sexual Activity   Alcohol use: Not Currently   Drug use: Not Currently    Types: "Crack" cocaine    Comment: 2 months last use    Sexual activity: Not Currently  Other Topics Concern   Not on file  Social History Narrative   Pt lives at a group home; receives outpatient services from [place]   Social  Determinants of Health   Financial Resource Strain: Not on file  Food Insecurity: Not on file  Transportation Needs: Not on file  Physical Activity: Not on file  Stress: Not on file  Social Connections: Not on file  Intimate Partner Violence: Not on file    ROS      Objective    There were no vitals taken for this visit.  Physical Exam  {Labs (Optional):23779}    Assessment & Plan:   Problem List Items Addressed This Visit   None   No follow-ups on file.   Shan Levans, MD

## 2021-08-22 ENCOUNTER — Encounter (HOSPITAL_COMMUNITY): Payer: Self-pay

## 2021-08-22 ENCOUNTER — Emergency Department (HOSPITAL_COMMUNITY)
Admission: EM | Admit: 2021-08-22 | Discharge: 2021-08-22 | Disposition: A | Discharge status: Home / Self Care | Payer: Medicaid Other | Attending: Emergency Medicine | Admitting: Emergency Medicine

## 2021-08-22 DIAGNOSIS — S40861A Insect bite (nonvenomous) of right upper arm, initial encounter: Secondary | ICD-10-CM | POA: Diagnosis present

## 2021-08-22 DIAGNOSIS — Z7951 Long term (current) use of inhaled steroids: Secondary | ICD-10-CM | POA: Insufficient documentation

## 2021-08-22 DIAGNOSIS — J449 Chronic obstructive pulmonary disease, unspecified: Secondary | ICD-10-CM | POA: Diagnosis not present

## 2021-08-22 DIAGNOSIS — W57XXXA Bitten or stung by nonvenomous insect and other nonvenomous arthropods, initial encounter: Secondary | ICD-10-CM | POA: Insufficient documentation

## 2021-08-22 MED ORDER — FAMOTIDINE 20 MG PO TABS
20.0000 mg | ORAL_TABLET | Freq: Once | ORAL | Status: AC
  Administered 2021-08-22: 20 mg via ORAL
  Filled 2021-08-22: qty 1

## 2021-08-22 MED ORDER — DOXYCYCLINE HYCLATE 100 MG PO TABS
100.0000 mg | ORAL_TABLET | Freq: Once | ORAL | Status: AC
  Administered 2021-08-22: 100 mg via ORAL
  Filled 2021-08-22: qty 1

## 2021-08-22 MED ORDER — DOXYCYCLINE HYCLATE 100 MG PO CAPS: 100.0000 mg | ORAL_CAPSULE | Freq: Two times a day (BID) | ORAL | 0 refills | Status: AC

## 2021-08-22 MED ORDER — IBUPROFEN 200 MG PO TABS
600.0000 mg | ORAL_TABLET | Freq: Once | ORAL | Status: AC
  Administered 2021-08-22: 600 mg via ORAL
  Filled 2021-08-22: qty 3

## 2021-08-22 NOTE — ED Provider Notes (Signed)
[facility]-EMERGENCY DEPT Provider Note   CSN: [id number] Arrival date & time: 08/22/21  1731     History  Chief Complaint  Patient presents with   Insect Bite    Melissa Dickerson is a 51 y.o. female.  HPI Patient is a 51 year old female with past medical history significant for schizoaffective disorder, COPD, bipolar, depression  She presented emergency room today with complaints of itchy insect bite on right upper arm.  She states that she was bit yesterday and states that has been itchy since that time.  She drew a circle around it and an ink pen and states that this was done at approximately 3 PM.  She has not noticed any change since then.  She denies any fevers chills nausea vomiting chest pain difficulty breathing or lightheadedness dizziness.  No other associate symptoms.      Home Medications Prior to Admission medications   Medication Sig Start Date End Date Taking? Authorizing Provider  doxycycline (VIBRAMYCIN) 100 MG capsule Take 1 capsule (100 mg total) by mouth 2 (two) times daily for 7 days. 08/22/21 08/29/21 Yes Finley Dinkel S, PA  albuterol (VENTOLIN HFA) 108 (90 Base) MCG/ACT inhaler Inhale 1 puff into the lungs 3 (three) times daily as needed for shortness of breath or wheezing. 07/12/21   Storm Frisk, MD  ARIPiprazole (ABILIFY) 15 MG tablet Take 1 tablet (15 mg total) by mouth daily. 07/12/21   Storm Frisk, MD  benztropine (COGENTIN) 1 MG tablet Take 1 tablet (1 mg total) by mouth at bedtime. 07/12/21   Storm Frisk, MD  divalproex (DEPAKOTE) 250 MG DR tablet Take 1 tablet (250 mg total) by mouth 2 (two) times daily. 07/12/21   Storm Frisk, MD  fluticasone-salmeterol (ADVAIR) 250-50 MCG/ACT AEPB Inhale 1 puff into the lungs 2 (two) times daily. 07/12/21   Storm Frisk, MD  gabapentin (NEURONTIN) 300 MG capsule Take 1 capsule (300 mg total) by mouth at bedtime. 10/10/19   Harris, Abigail, PA-C  ipratropium-albuterol  (DUONEB) 0.5-2.5 (3) MG/3ML SOLN Take 3 mLs by nebulization every 6 (six) hours as needed. 10/10/19   Arthor Captain, PA-C  meloxicam (MOBIC) 7.5 MG tablet Take 7.5 mg by mouth daily. 07/22/19   [provider]  montelukast (SINGULAIR) 10 MG tablet Take 1 tablet (10 mg total) by mouth daily. 07/12/21   Storm Frisk, MD  Multiple Vitamin (MULTIVITAMIN WITH MINERALS) TABS tablet Take 1 tablet by mouth daily. 05/31/18   Malvin Johns, MD  predniSONE (DELTASONE) 50 MG tablet 1 p.o. daily x5 02/06/21   Lorre Nick, MD  tiZANidine (ZANAFLEX) 4 MG tablet Take 4 mg by mouth 2 (two) times daily as needed for muscle spasms. 07/22/19   [provider]  valsartan-hydrochlorothiazide (DIOVAN-HCT) 160-25 MG tablet Take 1 tablet by mouth daily. 07/12/21   Storm Frisk, MD  Vitamin D, Ergocalciferol, (DRISDOL) 1.25 MG (50000 UNIT) CAPS capsule Take 1 capsule (50,000 Units total) by mouth once a week. 10/10/19   Arthor Captain, PA-C      Allergies    Haldol [haloperidol], Pork-derived products, and Prozac [fluoxetine hcl]    Review of Systems   Review of Systems  Physical Exam Updated Vital Signs BP (!) 159/97 (BP Location: Right Wrist)   Pulse 88   Temp 99.3 F (37.4 C) (Oral)   Resp 18   LMP  (LMP Unknown) Comment: Pt reports she thinks she is going through menopause  SpO2 95%  Physical Exam Vitals and  nursing note reviewed.  Constitutional:      General: She is not in acute distress.    Appearance: Normal appearance. She is not ill-appearing.  HENT:     Head: Normocephalic and atraumatic.  Eyes:     General: No scleral icterus.       Right eye: No discharge.        Left eye: No discharge.     Conjunctiva/sclera: Conjunctivae normal.  Pulmonary:     Effort: Pulmonary effort is normal.     Breath sounds: No stridor.  Skin:    Comments: Right upper arm with approximately 4 cm circular area of erythema surrounding a small puncture wound consistent with an insect bite.   Neurological:     Mental Status: She is alert and oriented to person, place, and time. Mental status is at baseline.     ED Results / Procedures / Treatments   Labs (all labs ordered are listed, but only abnormal results are displayed) Labs Reviewed - No data to display  EKG None  Radiology No results found.  Procedures Procedures    Medications Ordered in ED Medications  doxycycline (VIBRA-TABS) tablet 100 mg (100 mg Oral Given 08/22/21 1834)  ibuprofen (ADVIL) tablet 600 mg (600 mg Oral Given 08/22/21 1834)  famotidine (PEPCID) tablet 20 mg (20 mg Oral Given 08/22/21 1834)    ED Course/ Medical Decision Making/ A&P                           Medical Decision Making  Patient is a 51 year old female with past medical history significant for schizoaffective disorder, COPD, bipolar, depression  She presented emergency room today with complaints of itchy insect bite on right upper arm.  She states that she was bit yesterday and states that has been itchy since that time.  She drew a circle around it and an ink pen and states that this was done at approximately 3 PM.  She has not noticed any change since then.  She denies any fevers chills nausea vomiting chest pain difficulty breathing or lightheadedness dizziness.  No other associate symptoms.   Physical exam consistent with mild well localized cellulitis versus inflammatory response to insect bite.  Recommend cool compresses, Tylenol ibuprofen for pain and Benadryl for Pepcid for itching.  Will prescribe patient doxycycline.  Given first dose of these medications here.  Return precautions discussed.  She will follow-up with PCP  Final Clinical Impression(s) / ED Diagnoses Final diagnoses:  Insect bite of right upper arm, initial encounter    Rx / DC Orders ED Discharge Orders          Ordered    doxycycline (VIBRAMYCIN) 100 MG capsule  2 times daily        08/22/21 1811              Solon Augusta [place],  [place] 08/22/21 1844    Arby Barrette, MD 08/25/21 1231

## 2021-08-30 ENCOUNTER — Encounter: Payer: Self-pay | Admitting: *Deleted

## 2021-08-30 ENCOUNTER — Encounter: Payer: Self-pay | Admitting: Physician Assistant

## 2021-08-30 ENCOUNTER — Other Ambulatory Visit: Payer: Self-pay | Admitting: Critical Care Medicine

## 2021-08-30 MED ORDER — DIVALPROEX SODIUM 250 MG PO DR TAB
250.0000 mg | DELAYED_RELEASE_TABLET | Freq: Two times a day (BID) | ORAL | 0 refills | Status: DC
Start: 2021-08-30 — End: 2022-03-26

## 2021-08-30 MED ORDER — PREDNISONE 10 MG PO TABS: ORAL_TABLET | ORAL | 0 refills | Status: DC

## 2021-08-30 MED ORDER — BENZTROPINE MESYLATE 1 MG PO TABS
1.0000 mg | ORAL_TABLET | Freq: Every day | ORAL | 0 refills | Status: DC
Start: 2021-08-30 — End: 2022-03-26

## 2021-08-30 MED ORDER — VALSARTAN-HYDROCHLOROTHIAZIDE 160-25 MG PO TABS: 1.0000 | ORAL_TABLET | Freq: Every day | ORAL | 3 refills | Status: DC

## 2021-08-30 MED ORDER — FLUTICASONE-SALMETEROL 250-50 MCG/ACT IN AEPB: 1.0000 | INHALATION_SPRAY | Freq: Two times a day (BID) | RESPIRATORY_TRACT | 6 refills | Status: DC

## 2021-08-30 MED ORDER — ARIPIPRAZOLE 15 MG PO TABS: 15.0000 mg | ORAL_TABLET | Freq: Every day | ORAL | 0 refills | Status: DC

## 2021-08-30 MED ORDER — ALBUTEROL SULFATE HFA 108 (90 BASE) MCG/ACT IN AERS: 1.0000 | INHALATION_SPRAY | Freq: Three times a day (TID) | RESPIRATORY_TRACT | 0 refills | Status: AC | PRN

## 2021-08-30 MED ORDER — MONTELUKAST SODIUM 10 MG PO TABS: 10.0000 mg | ORAL_TABLET | Freq: Every day | ORAL | 0 refills | Status: DC

## 2021-08-30 NOTE — Congregational Nurse Program (Signed)
Pt states "my copd is bad". She has notable wheezes. Just came from outside.  Will add to dr Delford Field today.

## 2021-08-30 NOTE — Progress Notes (Cosign Needed)
Pt seen by Dr Delford Field.  She is still smoking, wants to quit. Is still smoking a pack/day.   She is moving out on July 19th to an apartment.   She needs to be seen in the office. She is reluctant to make an appointment, says she forgets things.   She was reassured that she will get reminders from the office, and the location of the office was reviewed.   She is out of her medications. She wants them sent to the [facility] on [address] at [facility].   She thinks she does ok w/ prednisone. 10 mg tabs, 4 tabs daily for 5 days and stop. Other meds refilled.   She has insp/exp wheezing 1 + LE edema  Today's Vitals   08/30/21 1448  BP: (!) 142/88  Pulse: 77  Resp: (!) 24  SpO2: 93%   Prior to Admission medications   Medication Sig Start Date End Date Taking? Authorizing Provider  albuterol (VENTOLIN HFA) 108 (90 Base) MCG/ACT inhaler Inhale 1 puff into the lungs 3 (three) times daily as needed for shortness of breath or wheezing. 07/12/21   Storm Frisk, MD  ARIPiprazole (ABILIFY) 15 MG tablet Take 1 tablet (15 mg total) by mouth daily. 07/12/21   Storm Frisk, MD  benztropine (COGENTIN) 1 MG tablet Take 1 tablet (1 mg total) by mouth at bedtime. 07/12/21   Storm Frisk, MD  divalproex (DEPAKOTE) 250 MG DR tablet Take 1 tablet (250 mg total) by mouth 2 (two) times daily. 07/12/21   Storm Frisk, MD  fluticasone-salmeterol (ADVAIR) 250-50 MCG/ACT AEPB Inhale 1 puff into the lungs 2 (two) times daily. 07/12/21   Storm Frisk, MD  gabapentin (NEURONTIN) 300 MG capsule Take 1 capsule (300 mg total) by mouth at bedtime. 10/10/19   Harris, Abigail, PA-C  ipratropium-albuterol (DUONEB) 0.5-2.5 (3) MG/3ML SOLN Take 3 mLs by nebulization every 6 (six) hours as needed. 10/10/19   Arthor Captain, PA-C  meloxicam (MOBIC) 7.5 MG tablet Take 7.5 mg by mouth daily. 07/22/19   [provider]  montelukast (SINGULAIR) 10 MG tablet Take 1 tablet (10 mg total) by mouth daily. 07/12/21    Storm Frisk, MD  Multiple Vitamin (MULTIVITAMIN WITH MINERALS) TABS tablet Take 1 tablet by mouth daily. 05/31/18   Malvin Johns, MD  predniSONE (DELTASONE) 50 MG tablet 1 p.o. daily x5 02/06/21   Lorre Nick, MD  tiZANidine (ZANAFLEX) 4 MG tablet Take 4 mg by mouth 2 (two) times daily as needed for muscle spasms. 07/22/19   [provider]  valsartan-hydrochlorothiazide (DIOVAN-HCT) 160-25 MG tablet Take 1 tablet by mouth daily. 07/12/21   Storm Frisk, MD  Vitamin D, Ergocalciferol, (DRISDOL) 1.25 MG (50000 UNIT) CAPS capsule Take 1 capsule (50,000 Units total) by mouth once a week. 10/10/19   Arthor Captain, PA-C   Bronwen Pendergraft, PA-C 08/30/2021 3:02 PM

## 2021-10-16 ENCOUNTER — Other Ambulatory Visit: Payer: Self-pay

## 2021-10-16 ENCOUNTER — Encounter (HOSPITAL_COMMUNITY): Payer: Self-pay

## 2021-10-16 ENCOUNTER — Emergency Department (HOSPITAL_COMMUNITY)
Admission: EM | Admit: 2021-10-16 | Discharge: 2021-10-17 | Disposition: A | Discharge status: Home / Self Care | Payer: Medicaid Other | Attending: Emergency Medicine | Admitting: Emergency Medicine

## 2021-10-16 DIAGNOSIS — R531 Weakness: Secondary | ICD-10-CM | POA: Diagnosis present

## 2021-10-16 DIAGNOSIS — N939 Abnormal uterine and vaginal bleeding, unspecified: Secondary | ICD-10-CM

## 2021-10-16 DIAGNOSIS — R42 Dizziness and giddiness: Secondary | ICD-10-CM | POA: Diagnosis not present

## 2021-10-16 DIAGNOSIS — N938 Other specified abnormal uterine and vaginal bleeding: Secondary | ICD-10-CM | POA: Insufficient documentation

## 2021-10-16 DIAGNOSIS — R5383 Other fatigue: Secondary | ICD-10-CM | POA: Diagnosis not present

## 2021-10-16 LAB — PROTIME-INR
INR: 0.9 (ref 0.8–1.2)
Prothrombin Time: 12.4 seconds (ref 11.4–15.2)

## 2021-10-16 LAB — CBC
HCT: 45.1 % (ref 36.0–46.0)
Hemoglobin: 14.2 g/dL (ref 12.0–15.0)
MCH: 28.2 pg (ref 26.0–34.0)
MCHC: 31.5 g/dL (ref 30.0–36.0)
MCV: 89.5 fL (ref 80.0–100.0)
Platelets: 246 10*3/uL (ref 150–400)
RBC: 5.04 MIL/uL (ref 3.87–5.11)
RDW: 14.9 % (ref 11.5–15.5)
WBC: 10.1 10*3/uL (ref 4.0–10.5)
nRBC: 0 % (ref 0.0–0.2)

## 2021-10-16 LAB — URINALYSIS, ROUTINE W REFLEX MICROSCOPIC
Bilirubin Urine: NEGATIVE
Glucose, UA: NEGATIVE mg/dL
Ketones, ur: NEGATIVE mg/dL
Nitrite: NEGATIVE
Protein, ur: 30 mg/dL — AB
RBC / HPF: >50 RBC/hpf — ABNORMAL HIGH (ref 0–5)
Specific Gravity, Urine: 1.02 (ref 1.005–1.030)
pH: 7 (ref 5.0–8.0)

## 2021-10-16 LAB — PREGNANCY, URINE: Preg Test, Ur: NEGATIVE

## 2021-10-16 NOTE — ED Notes (Signed)
Lab called to recollect a light green tube.

## 2021-10-16 NOTE — ED Triage Notes (Addendum)
Pt BIB [facility] with reports of weakness and dizziness x 3 days. Pt has been having vaginal bleeding x 3 days, heavy bleeding and clots (first time in 3 months, pt thought she was in menopause). Pt reports having a mass on her kidney over a year ago.

## 2021-10-16 NOTE — ED Provider Triage Note (Signed)
Emergency Medicine Provider Triage Evaluation Note  Melissa Dickerson , a 51 y.o. female  was evaluated in triage.  Pt complains of weakness,fatigue, vaginal bleeding (for 3 days). No pain. No fevers or chills.  No NVD.   States her vaginal bleeding is what prompted her to come.  Has no BP medications (out of lisinopril)  Homeless lives in shelter  Review of Systems  Positive: Vaginal bleeding Negative: Fever   Physical Exam  BP (!) 194/116 (BP Location: Right Arm)   Pulse 78   Temp 98.6 F (37 C) (Oral)   Resp 20   Ht 5' 5.5" (1.664 m)   Wt 136.1 kg   SpO2 92%   BMI 49.16 kg/m  Gen:   Awake, no distress   Resp:  Normal effort  MSK:   Moves extremities without difficulty  Other:    Medical Decision Making  Medically screening exam initiated at 9:12 PM.  Appropriate orders placed.  Melissa Dickerson was informed that the remainder of the evaluation will be completed by another provider, this initial triage assessment does not replace that evaluation, and the importance of remaining in the ED until their evaluation is complete.  [address]   Solon Augusta [place], [place] 10/16/21 2114

## 2021-10-17 NOTE — ED Provider Notes (Signed)
[facility]-EMERGENCY DEPT Provider Note   CSN: [id number] Arrival date & time: 10/16/21  2025     History  Chief Complaint  Patient presents with   Weakness   Dizziness    Melissa Dickerson is a 51 y.o. female.  51 yo F with a chief complaints of vaginal bleeding.  The patient tells me that she thought she was going through menopause and had not had a menstrual cycle in about 6 months.  She developed bleeding slightly worse than her normal menstrual cycle about 3 days ago.  Since then she has felt very fatigued and lightheaded.  She was concerned that something was wrong and so came here for evaluation.  She has some mild abdominal cramping.  Denies fevers denies vomiting.   Weakness Associated symptoms: dizziness   Dizziness Associated symptoms: weakness        Home Medications Prior to Admission medications   Medication Sig Start Date End Date Taking? Authorizing Provider  albuterol (VENTOLIN HFA) 108 (90 Base) MCG/ACT inhaler Inhale 1 puff into the lungs 3 (three) times daily as needed for shortness of breath or wheezing. 08/30/21   Storm Frisk, MD  ARIPiprazole (ABILIFY) 15 MG tablet Take 1 tablet (15 mg total) by mouth daily. 08/30/21   Storm Frisk, MD  benztropine (COGENTIN) 1 MG tablet Take 1 tablet (1 mg total) by mouth at bedtime. 08/30/21   Storm Frisk, MD  divalproex (DEPAKOTE) 250 MG DR tablet Take 1 tablet (250 mg total) by mouth 2 (two) times daily. 08/30/21   Storm Frisk, MD  fluticasone-salmeterol (ADVAIR) 250-50 MCG/ACT AEPB Inhale 1 puff into the lungs 2 (two) times daily. 08/30/21   Storm Frisk, MD  gabapentin (NEURONTIN) 300 MG capsule Take 1 capsule (300 mg total) by mouth at bedtime. 10/10/19   Harris, Abigail, PA-C  ipratropium-albuterol (DUONEB) 0.5-2.5 (3) MG/3ML SOLN Take 3 mLs by nebulization every 6 (six) hours as needed. 10/10/19   Arthor Captain, PA-C  meloxicam (MOBIC) 7.5 MG tablet Take 7.5 mg by mouth daily.  07/22/19   [provider]  montelukast (SINGULAIR) 10 MG tablet Take 1 tablet (10 mg total) by mouth daily. 08/30/21   Storm Frisk, MD  Multiple Vitamin (MULTIVITAMIN WITH MINERALS) TABS tablet Take 1 tablet by mouth daily. 05/31/18   Malvin Johns, MD  predniSONE (DELTASONE) 10 MG tablet Take 4 tablets daily for 5 days then stop 08/30/21   Storm Frisk, MD  tiZANidine (ZANAFLEX) 4 MG tablet Take 4 mg by mouth 2 (two) times daily as needed for muscle spasms. 07/22/19   [provider]  valsartan-hydrochlorothiazide (DIOVAN-HCT) 160-25 MG tablet Take 1 tablet by mouth daily. 08/30/21   Storm Frisk, MD  Vitamin D, Ergocalciferol, (DRISDOL) 1.25 MG (50000 UNIT) CAPS capsule Take 1 capsule (50,000 Units total) by mouth once a week. 10/10/19   Arthor Captain, PA-C      Allergies    Haldol [haloperidol], Pork-derived products, and Prozac [fluoxetine hcl]    Review of Systems   Review of Systems  Neurological:  Positive for dizziness and weakness.    Physical Exam Updated Vital Signs BP (!) 172/101   Pulse 68   Temp 98.6 F (37 C) (Oral)   Resp 20   Ht 5' 5.5" (1.664 m)   Wt 136.1 kg   SpO2 97%   BMI 49.16 kg/m  Physical Exam Vitals and nursing note reviewed.  Constitutional:      General: She is  not in acute distress.    Appearance: She is well-developed. She is not diaphoretic.  HENT:     Head: Normocephalic and atraumatic.  Eyes:     Pupils: Pupils are equal, round, and reactive to light.  Cardiovascular:     Rate and Rhythm: Normal rate and regular rhythm.     Heart sounds: No murmur heard.    No friction rub. No gallop.  Pulmonary:     Effort: Pulmonary effort is normal.     Breath sounds: No wheezing or rales.  Abdominal:     General: There is no distension.     Palpations: Abdomen is soft.     Tenderness: There is no abdominal tenderness.     Comments: Benign abdominal exam  Musculoskeletal:        General: No tenderness.     Cervical back:  Normal range of motion and neck supple.  Skin:    General: Skin is warm and dry.  Neurological:     Mental Status: She is alert and oriented to person, place, and time.  Psychiatric:        Behavior: Behavior normal.     ED Results / Procedures / Treatments   Labs (all labs ordered are listed, but only abnormal results are displayed) Labs Reviewed  URINALYSIS, ROUTINE W REFLEX MICROSCOPIC - Abnormal; Notable for the following components:      Result Value   APPearance CLOUDY (*)    Hgb urine dipstick LARGE (*)    Protein, ur 30 (*)    Leukocytes,Ua TRACE (*)    RBC / HPF >50 (*)    Bacteria, UA FEW (*)    All other components within normal limits  CBC  PREGNANCY, URINE  PROTIME-INR  COMPREHENSIVE METABOLIC PANEL    EKG None  Radiology No results found.  Procedures Procedures    Medications Ordered in ED Medications - No data to display  ED Course/ Medical Decision Making/ A&P                           Medical Decision Making  51 yo F with a chief complaints of vaginal bleeding.  It sounds like the patient has not had a menstrual cycle in about 6 months and then had 1 starting about 3 days ago.  Her hemoglobin here is normal.  She is not tachycardic.  Has a benign abdominal exam.  Her pregnancy test is negative.  Will have the patient follow-up with GYN in the office.  12:28 AM:  I have discussed the diagnosis/risks/treatment options with the patient.  Evaluation and diagnostic testing in the emergency department does not suggest an emergent condition requiring admission or immediate intervention beyond what has been performed at this time.  They will follow up with  GYN. We also discussed returning to the ED immediately if new or worsening sx occur. We discussed the sx which are most concerning (e.g., sudden worsening pain, fever, inability to tolerate by mouth) that necessitate immediate return. Medications administered to the patient during their visit and any new  prescriptions provided to the patient are listed below.  Medications given during this visit Medications - No data to display   The patient appears reasonably screen and/or stabilized for discharge and I doubt any other medical condition or other [facility] requiring further screening, evaluation, or treatment in the ED at this time prior to discharge.          Final Clinical Impression(s) /  ED Diagnoses Final diagnoses:  Vaginal bleeding    Rx / DC Orders ED Discharge Orders     None         Melene Plan, DO 10/17/21 0028

## 2021-10-17 NOTE — Discharge Instructions (Signed)
Return for worsening bleeding, if you pass out. Follow up with GYN.

## 2021-10-19 ENCOUNTER — Ambulatory Visit: Payer: Medicaid Other | Admitting: Critical Care Medicine

## 2021-10-19 NOTE — Progress Notes (Deleted)
New Patient Office Visit  Subjective    Patient ID: Melissa Dickerson, female    DOB: November 18, 1970  Age: 51 y.o. MRN: [id number]  CC: No chief complaint on file.   HPI [name] presents to [facility]: (Physician)               This is a 51 year old female who is here at the [facility] has just arrived for 1 day.  She has been homeless for several years precipitated by mental health and drug use issues.  She had been with [facility] and got disconnected from [facility].  She does have full Medicaid.  She lost all her medications and transition and needs her medications refilled.  She does have underlying COPD.     Patient is an active smoker.  On exam blood pressure 159/92 saturation 96%   Chest shows expiratory wheezes   Patient is moderately obese   There is no peripheral edema   Impression here is that of hypertension COPD reflux schizoaffective disorder and depression and tobacco use   Plan is to refill albuterol inhaler to take as needed, Abilify 15 mg daily, Cogentin 1 mg daily, Depakote 250 mg twice daily, Advair 1 puff twice daily to 50 strength, montelukast 10 mg daily, valsartan HCT 160/25.  We were able to retrieve these medications for the patient today as she has a very low co-pay this was placed on account      Outpatient Encounter Medications as of 10/19/2021  Medication Sig   albuterol (VENTOLIN HFA) 108 (90 Base) MCG/ACT inhaler Inhale 1 puff into the lungs 3 (three) times daily as needed for shortness of breath or wheezing.   ARIPiprazole (ABILIFY) 15 MG tablet Take 1 tablet (15 mg total) by mouth daily.   benztropine (COGENTIN) 1 MG tablet Take 1 tablet (1 mg total) by mouth at bedtime.   divalproex (DEPAKOTE) 250 MG DR tablet Take 1 tablet (250 mg total) by mouth 2 (two) times daily.   fluticasone-salmeterol (ADVAIR) 250-50 MCG/ACT AEPB Inhale 1 puff into the lungs 2 (two) times daily.   gabapentin (NEURONTIN) 300 MG capsule Take 1  capsule (300 mg total) by mouth at bedtime.   ipratropium-albuterol (DUONEB) 0.5-2.5 (3) MG/3ML SOLN Take 3 mLs by nebulization every 6 (six) hours as needed.   meloxicam (MOBIC) 7.5 MG tablet Take 7.5 mg by mouth daily.   montelukast (SINGULAIR) 10 MG tablet Take 1 tablet (10 mg total) by mouth daily.   Multiple Vitamin (MULTIVITAMIN WITH MINERALS) TABS tablet Take 1 tablet by mouth daily.   predniSONE (DELTASONE) 10 MG tablet Take 4 tablets daily for 5 days then stop   tiZANidine (ZANAFLEX) 4 MG tablet Take 4 mg by mouth 2 (two) times daily as needed for muscle spasms.   valsartan-hydrochlorothiazide (DIOVAN-HCT) 160-25 MG tablet Take 1 tablet by mouth daily.   Vitamin D, Ergocalciferol, (DRISDOL) 1.25 MG (50000 UNIT) CAPS capsule Take 1 capsule (50,000 Units total) by mouth once a week.   No facility-administered encounter medications on file as of 10/19/2021.    Past Medical History:  Diagnosis Date   Bipolar 1 disorder (HCC)    COPD (chronic obstructive pulmonary disease) (HCC)    Depression    Hypertension    Schizoaffective disorder (HCC)     Past Surgical History:  Procedure Laterality Date   CHOLECYSTECTOMY      Family History  Problem Relation Age of Onset   Cancer Mother     Social History  Socioeconomic History   Marital status: Divorced    Spouse name: Not on file   Number of children: Not on file   Years of education: Not on file   Highest education level: Not on file  Occupational History   Not on file  Tobacco Use   Smoking status: Every Day    Packs/day: 0.50    Years: 33.00    Total pack years: 16.50    Types: Cigarettes   Smokeless tobacco: Never  Vaping Use   Vaping Use: Never used  Substance and Sexual Activity   Alcohol use: Not Currently   Drug use: Not Currently    Types: "Crack" cocaine    Comment: 2 months last use    Sexual activity: Not Currently  Other Topics Concern   Not on file  Social History Narrative   Pt lives at a group  home; receives outpatient services from [employer]   Social Determinants of Health   Financial Resource Strain: Not on file  Food Insecurity: Not on file  Transportation Needs: Not on file  Physical Activity: Not on file  Stress: Not on file  Social Connections: Not on file  Intimate Partner Violence: Not on file    ROS      Objective    There were no vitals taken for this visit.  Physical Exam  {Labs (Optional):23779}    Assessment & Plan:   Problem List Items Addressed This Visit   None   No follow-ups on file.   Shan Levans, MD

## 2022-03-24 ENCOUNTER — Encounter (HOSPITAL_COMMUNITY): Payer: Self-pay

## 2022-03-24 ENCOUNTER — Observation Stay (HOSPITAL_COMMUNITY)
Admission: EM | Admit: 2022-03-24 | Discharge: 2022-03-26 | Disposition: A | Discharge status: Home / Self Care | Payer: Medicaid Other | Attending: Family Medicine | Admitting: Family Medicine

## 2022-03-24 ENCOUNTER — Emergency Department (HOSPITAL_COMMUNITY): Payer: Medicaid Other

## 2022-03-24 DIAGNOSIS — J441 Chronic obstructive pulmonary disease with (acute) exacerbation: Principal | ICD-10-CM | POA: Diagnosis present

## 2022-03-24 DIAGNOSIS — Z79899 Other long term (current) drug therapy: Secondary | ICD-10-CM | POA: Insufficient documentation

## 2022-03-24 DIAGNOSIS — Z1152 Encounter for screening for COVID-19: Secondary | ICD-10-CM | POA: Diagnosis not present

## 2022-03-24 DIAGNOSIS — R0602 Shortness of breath: Secondary | ICD-10-CM | POA: Diagnosis present

## 2022-03-24 DIAGNOSIS — Z23 Encounter for immunization: Secondary | ICD-10-CM | POA: Diagnosis not present

## 2022-03-24 DIAGNOSIS — F251 Schizoaffective disorder, depressive type: Secondary | ICD-10-CM | POA: Diagnosis present

## 2022-03-24 DIAGNOSIS — J9601 Acute respiratory failure with hypoxia: Secondary | ICD-10-CM | POA: Diagnosis not present

## 2022-03-24 DIAGNOSIS — F332 Major depressive disorder, recurrent severe without psychotic features: Secondary | ICD-10-CM | POA: Diagnosis present

## 2022-03-24 DIAGNOSIS — F17210 Nicotine dependence, cigarettes, uncomplicated: Secondary | ICD-10-CM | POA: Diagnosis not present

## 2022-03-24 DIAGNOSIS — K219 Gastro-esophageal reflux disease without esophagitis: Secondary | ICD-10-CM | POA: Diagnosis present

## 2022-03-24 DIAGNOSIS — Z720 Tobacco use: Secondary | ICD-10-CM | POA: Diagnosis present

## 2022-03-24 DIAGNOSIS — I10 Essential (primary) hypertension: Secondary | ICD-10-CM | POA: Diagnosis present

## 2022-03-24 LAB — CBC WITH DIFFERENTIAL/PLATELET
Abs Immature Granulocytes: 0.15 10*3/uL — ABNORMAL HIGH (ref 0.00–0.07)
Basophils Absolute: 0.1 10*3/uL (ref 0.0–0.1)
Basophils Relative: 1 %
Eosinophils Absolute: 0.2 10*3/uL (ref 0.0–0.5)
Eosinophils Relative: 2 %
HCT: 46.8 % — ABNORMAL HIGH (ref 36.0–46.0)
Hemoglobin: 14.7 g/dL (ref 12.0–15.0)
Immature Granulocytes: 2 %
Lymphocytes Relative: 37 %
Lymphs Abs: 3.5 10*3/uL (ref 0.7–4.0)
MCH: 27.3 pg (ref 26.0–34.0)
MCHC: 31.4 g/dL (ref 30.0–36.0)
MCV: 87 fL (ref 80.0–100.0)
Monocytes Absolute: 0.5 10*3/uL (ref 0.1–1.0)
Monocytes Relative: 5 %
Neutro Abs: 5.2 10*3/uL (ref 1.7–7.7)
Neutrophils Relative %: 53 %
Platelets: 238 10*3/uL (ref 150–400)
RBC: 5.38 MIL/uL — ABNORMAL HIGH (ref 3.87–5.11)
RDW: 14.4 % (ref 11.5–15.5)
WBC: 9.5 10*3/uL (ref 4.0–10.5)
nRBC: 0 % (ref 0.0–0.2)

## 2022-03-24 LAB — RESP PANEL BY RT-PCR (RSV, FLU A&B, COVID)  RVPGX2
Influenza A by PCR: NEGATIVE
Influenza B by PCR: NEGATIVE
Resp Syncytial Virus by PCR: NEGATIVE
SARS Coronavirus 2 by RT PCR: NEGATIVE

## 2022-03-24 LAB — BASIC METABOLIC PANEL
Anion gap: 10 (ref 5–15)
BUN: 10 mg/dL (ref 6–20)
CO2: 27 mmol/L (ref 22–32)
Calcium: 8.9 mg/dL (ref 8.9–10.3)
Chloride: 100 mmol/L (ref 98–111)
Creatinine, Ser: 0.86 mg/dL (ref 0.44–1.00)
GFR, Estimated: >60 mL/min (ref >60)
Glucose, Bld: 104 mg/dL — ABNORMAL HIGH (ref 70–99)
Potassium: 4 mmol/L (ref 3.5–5.1)
Sodium: 137 mmol/L (ref 135–145)

## 2022-03-24 NOTE — ED Triage Notes (Signed)
Pt arrived via EMS, from home, c/o SOB that started earlier after smoking. Hx of COPD   2.5 albuterol neb tx given en route

## 2022-03-24 NOTE — ED Provider Triage Note (Signed)
Emergency Medicine Provider Triage Evaluation Note  Melissa Dickerson , a 52 y.o. female  was evaluated in triage.  Pt complains of increased cough, shortness of breath, green sputum production over the past 2 weeks.  She has a history of COPD.  She cannot currently afford medications for COPD.  No fevers.  She called EMS today after she smoked a cigarette which made her shortness of breath feel worse.  Review of Systems  Positive: Shortness of breath and cough Negative: Fever  Physical Exam  BP (!) 182/104 (BP Location: Left Arm)   Pulse 71   Temp 98.1 F (36.7 C) (Oral)   Resp 20   SpO2 94%  Gen:   Awake, no distress   Resp:  Normal effort  MSK:   Moves extremities without difficulty  Other:  Scattered wheezing and rhonchi  Medical Decision Making  Medically screening exam initiated at 5:57 PM.  Appropriate orders placed.  Gentri Milo was informed that the remainder of the evaluation will be completed by another provider, this initial triage assessment does not replace that evaluation, and the importance of remaining in the ED until their evaluation is complete.     Carlisle Cater, PA-C 03/24/22 1758

## 2022-03-25 ENCOUNTER — Other Ambulatory Visit: Payer: Self-pay

## 2022-03-25 DIAGNOSIS — J9601 Acute respiratory failure with hypoxia: Secondary | ICD-10-CM | POA: Diagnosis present

## 2022-03-25 LAB — BLOOD GAS, VENOUS
Acid-Base Excess: 5.7 mmol/L — ABNORMAL HIGH (ref 0.0–2.0)
Bicarbonate: 32.5 mmol/L — ABNORMAL HIGH (ref 20.0–28.0)
O2 Saturation: 92.2 %
Patient temperature: 36.1
pCO2, Ven: 53 mmHg (ref 44–60)
pH, Ven: 7.39 (ref 7.25–7.43)
pO2, Ven: 56 mmHg — ABNORMAL HIGH (ref 32–45)

## 2022-03-25 MED ORDER — MAGNESIUM SULFATE 2 GM/50ML IV SOLN
2.0000 g | Freq: Once | INTRAVENOUS | Status: AC
  Administered 2022-03-25: 2 g via INTRAVENOUS
  Filled 2022-03-25: qty 50

## 2022-03-25 MED ORDER — LISINOPRIL 10 MG PO TABS
10.0000 mg | ORAL_TABLET | Freq: Every day | ORAL | Status: DC
  Administered 2022-03-25: 10 mg via ORAL
  Filled 2022-03-25: qty 1

## 2022-03-25 MED ORDER — POTASSIUM CHLORIDE CRYS ER 20 MEQ PO TBCR
20.0000 meq | EXTENDED_RELEASE_TABLET | Freq: Once | ORAL | Status: AC
  Administered 2022-03-25: 20 meq via ORAL
  Filled 2022-03-25: qty 1

## 2022-03-25 MED ORDER — NICOTINE 21 MG/24HR TD PT24: 21.0000 mg | MEDICATED_PATCH | Freq: Every day | TRANSDERMAL | Status: DC | PRN

## 2022-03-25 MED ORDER — ACETAMINOPHEN 650 MG RE SUPP: 650.0000 mg | Freq: Four times a day (QID) | RECTAL | Status: DC | PRN

## 2022-03-25 MED ORDER — IPRATROPIUM BROMIDE HFA 17 MCG/ACT IN AERS
2.0000 | INHALATION_SPRAY | Freq: Once | RESPIRATORY_TRACT | Status: AC
Start: 2022-03-25 — End: 2022-03-25
  Administered 2022-03-25: 2 via RESPIRATORY_TRACT
  Filled 2022-03-25: qty 12.9

## 2022-03-25 MED ORDER — ALBUTEROL SULFATE (2.5 MG/3ML) 0.083% IN NEBU: 2.5000 mg | INHALATION_SOLUTION | RESPIRATORY_TRACT | Status: DC | PRN

## 2022-03-25 MED ORDER — PNEUMOCOCCAL 20-VAL CONJ VACC 0.5 ML IM SUSY
0.5000 mL | PREFILLED_SYRINGE | INTRAMUSCULAR | Status: AC
  Administered 2022-03-26: 0.5 mL via INTRAMUSCULAR
  Filled 2022-03-25: qty 0.5

## 2022-03-25 MED ORDER — METHYLPREDNISOLONE SODIUM SUCC 125 MG IJ SOLR
125.0000 mg | Freq: Once | INTRAMUSCULAR | Status: AC
  Administered 2022-03-25: 125 mg via INTRAVENOUS
  Filled 2022-03-25: qty 2

## 2022-03-25 MED ORDER — IPRATROPIUM-ALBUTEROL 0.5-2.5 (3) MG/3ML IN SOLN
3.0000 mL | RESPIRATORY_TRACT | Status: AC
  Administered 2022-03-25 (×3): 3 mL via RESPIRATORY_TRACT
  Filled 2022-03-25: qty 3
  Filled 2022-03-25: qty 6

## 2022-03-25 MED ORDER — AZITHROMYCIN 250 MG PO TABS
500.0000 mg | ORAL_TABLET | Freq: Every day | ORAL | Status: DC
  Administered 2022-03-25 – 2022-03-26 (×2): 500 mg via ORAL
  Filled 2022-03-25 (×2): qty 2

## 2022-03-25 MED ORDER — HYDROXYZINE HCL 25 MG PO TABS
25.0000 mg | ORAL_TABLET | Freq: Three times a day (TID) | ORAL | Status: DC | PRN
  Administered 2022-03-25: 25 mg via ORAL
  Filled 2022-03-25: qty 1

## 2022-03-25 MED ORDER — PREDNISONE 20 MG PO TABS
40.0000 mg | ORAL_TABLET | Freq: Every day | ORAL | Status: DC
  Administered 2022-03-26: 40 mg via ORAL
  Filled 2022-03-25: qty 2

## 2022-03-25 MED ORDER — ONDANSETRON HCL 4 MG PO TABS: 4.0000 mg | ORAL_TABLET | Freq: Four times a day (QID) | ORAL | Status: DC | PRN

## 2022-03-25 MED ORDER — AEROCHAMBER PLUS FLO-VU LARGE MISC
1.0000 | Freq: Once | Status: AC
  Administered 2022-03-25: 1
  Filled 2022-03-25 (×2): qty 1

## 2022-03-25 MED ORDER — NICOTINE 7 MG/24HR TD PT24
7.0000 mg | MEDICATED_PATCH | Freq: Every day | TRANSDERMAL | Status: DC | PRN
  Administered 2022-03-25: 7 mg via TRANSDERMAL
  Filled 2022-03-25: qty 1

## 2022-03-25 MED ORDER — IPRATROPIUM-ALBUTEROL 0.5-2.5 (3) MG/3ML IN SOLN
3.0000 mL | Freq: Four times a day (QID) | RESPIRATORY_TRACT | Status: DC
  Administered 2022-03-25 – 2022-03-26 (×4): 3 mL via RESPIRATORY_TRACT
  Filled 2022-03-25 (×3): qty 3

## 2022-03-25 MED ORDER — BENZTROPINE MESYLATE 0.5 MG PO TABS
1.0000 mg | ORAL_TABLET | Freq: Every day | ORAL | Status: DC
  Administered 2022-03-25: 1 mg via ORAL
  Filled 2022-03-25: qty 2

## 2022-03-25 MED ORDER — PREDNISONE 20 MG PO TABS
60.0000 mg | ORAL_TABLET | Freq: Once | ORAL | Status: AC
  Administered 2022-03-25: 60 mg via ORAL
  Filled 2022-03-25: qty 3

## 2022-03-25 MED ORDER — ACETAMINOPHEN 325 MG PO TABS: 650.0000 mg | ORAL_TABLET | Freq: Four times a day (QID) | ORAL | Status: DC | PRN

## 2022-03-25 MED ORDER — ALBUTEROL SULFATE (2.5 MG/3ML) 0.083% IN NEBU
10.0000 mg/h | INHALATION_SOLUTION | RESPIRATORY_TRACT | Status: AC
  Administered 2022-03-25: 10 mg/h via RESPIRATORY_TRACT
  Filled 2022-03-25: qty 12

## 2022-03-25 MED ORDER — DIVALPROEX SODIUM 250 MG PO DR TAB
250.0000 mg | DELAYED_RELEASE_TABLET | Freq: Two times a day (BID) | ORAL | Status: DC
  Administered 2022-03-25 – 2022-03-26 (×3): 250 mg via ORAL
  Filled 2022-03-25 (×3): qty 1

## 2022-03-25 MED ORDER — MONTELUKAST SODIUM 10 MG PO TABS
10.0000 mg | ORAL_TABLET | Freq: Every day | ORAL | Status: DC
  Administered 2022-03-25: 10 mg via ORAL
  Filled 2022-03-25: qty 1

## 2022-03-25 MED ORDER — INFLUENZA VAC SPLIT QUAD 0.5 ML IM SUSY
0.5000 mL | PREFILLED_SYRINGE | INTRAMUSCULAR | Status: AC
  Administered 2022-03-26: 0.5 mL via INTRAMUSCULAR
  Filled 2022-03-25: qty 0.5

## 2022-03-25 MED ORDER — ONDANSETRON HCL 4 MG/2ML IJ SOLN: 4.0000 mg | Freq: Four times a day (QID) | INTRAMUSCULAR | Status: DC | PRN

## 2022-03-25 NOTE — ED Provider Notes (Signed)
[place] EMERGENCY DEPARTMENT AT [facility] Provider Note  CSN: [id number] Arrival date & time: 03/24/22 1731  Chief Complaint(s) No chief complaint on file.  HPI Melissa Dickerson is a 52 y.o. female with a past medical history listed below including hypertension, COPD not on supplemental oxygen who presents to the emergency department with several days of shortness of breath with productive cough yellow/green sputum.  Gradual onset.  No associated chest pain.  No reported fevers or chills.  No nausea or vomiting.  No abdominal pain.  No known sick contacts.  States that she does not have her albuterol.  The history is provided by the patient.    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 Authorizing Provider  benztropine (COGENTIN) 1 MG tablet Take 1 tablet (1 mg total) by mouth at bedtime. 08/30/21  Yes Storm Frisk, MD  divalproex (DEPAKOTE) 250 MG DR tablet Take 1 tablet (250 mg total) by mouth 2 (two) times daily. 08/30/21  Yes Storm Frisk, MD  albuterol (VENTOLIN HFA) 108 (90 Base) MCG/ACT inhaler Inhale 1 puff into the lungs 3 (three) times daily as needed for shortness of breath or wheezing. Patient not taking: Reported on 03/25/2022 08/30/21   Storm Frisk, MD  ARIPiprazole (ABILIFY) 15 MG tablet Take 1 tablet (15 mg total) by mouth daily. Patient  not taking: Reported on 03/25/2022 08/30/21   Storm Frisk, MD  fluticasone-salmeterol (ADVAIR) 250-50 MCG/ACT AEPB Inhale 1 puff into the lungs 2 (two) times daily. Patient not taking: Reported on 03/25/2022 08/30/21   Storm Frisk, MD  gabapentin (NEURONTIN) 300 MG capsule Take 1 capsule (300 mg total) by mouth at bedtime. Patient not taking: Reported on 03/25/2022 10/10/19   Arthor Captain, PA-C  ipratropium-albuterol (DUONEB) 0.5-2.5 (3) MG/3ML SOLN Take 3 mLs by nebulization every 6 (six) hours as needed. Patient not taking: Reported on 03/25/2022 10/10/19   Arthor Captain, PA-C  montelukast (SINGULAIR) 10 MG tablet Take 1 tablet (10 mg total) by mouth daily. Patient not taking: Reported on 03/25/2022 08/30/21   Storm Frisk, MD  Multiple Vitamin (MULTIVITAMIN WITH MINERALS) TABS tablet Take 1 tablet by mouth daily. Patient not taking: Reported on 03/25/2022 05/31/18   Malvin Johns, MD  predniSONE (DELTASONE) 10 MG tablet Take 4 tablets daily for 5 days then stop Patient not taking: Reported on 03/25/2022 08/30/21   Storm Frisk, MD  valsartan-hydrochlorothiazide (DIOVAN-HCT) 160-25 MG tablet Take 1 tablet by mouth daily. Patient not taking: Reported on 03/25/2022 08/30/21   Storm Frisk, MD  Vitamin D, Ergocalciferol, (DRISDOL) 1.25 MG (50000 UNIT) CAPS capsule Take 1 capsule (50,000 Units total) by mouth once a week. Patient not taking: Reported on 03/25/2022 10/10/19   Arthor Captain, PA-C  Allergies Haldol [haloperidol], Pork-derived products, and Prozac [fluoxetine hcl]  Review of Systems Review of Systems As noted in HPI  Physical Exam Vital Signs  I have reviewed the triage vital signs BP (!) 151/84   Pulse 67   Temp 97.6 F (36.4 C) (Oral)   Resp 18   SpO2 90%   Physical Exam Vitals reviewed.  Constitutional:      General: She is not  in acute distress.    Appearance: She is well-developed. She is not diaphoretic.  HENT:     Head: Normocephalic and atraumatic.     Nose: Nose normal.  Eyes:     General: No scleral icterus.       Right eye: No discharge.        Left eye: No discharge.     Conjunctiva/sclera: Conjunctivae normal.     Pupils: Pupils are equal, round, and reactive to light.  Cardiovascular:     Rate and Rhythm: Normal rate and regular rhythm.     Heart sounds: No murmur heard.    No friction rub. No gallop.  Pulmonary:     Effort: Pulmonary effort is normal. Tachypnea and prolonged expiration present. No respiratory distress.     Breath sounds: No stridor. Examination of the right-upper field reveals wheezing. Examination of the left-upper field reveals wheezing. Examination of the right-middle field reveals wheezing. Examination of the left-middle field reveals wheezing. Examination of the right-lower field reveals wheezing. Examination of the left-lower field reveals wheezing. Wheezing present. No rales.  Abdominal:     General: There is no distension.     Palpations: Abdomen is soft.     Tenderness: There is no abdominal tenderness.  Musculoskeletal:        General: No tenderness.     Cervical back: Normal range of motion and neck supple.     Right lower leg: No edema.     Left lower leg: No edema.  Skin:    General: Skin is warm and dry.     Findings: No erythema or rash.  Neurological:     Mental Status: She is alert and oriented to person, place, and time.     ED Results and Treatments Labs (all labs ordered are listed, but only abnormal results are displayed) Labs Reviewed  CBC WITH DIFFERENTIAL/PLATELET - Abnormal; Notable for the following components:      Result Value   RBC 5.38 (*)    HCT 46.8 (*)    Abs Immature Granulocytes 0.15 (*)    All other components within normal limits  BASIC METABOLIC PANEL - Abnormal; Notable for the following components:   Glucose, Bld 104 (*)     All other components within normal limits  RESP PANEL BY RT-PCR (RSV, FLU A&B, COVID)  RVPGX2  I-STAT VENOUS BLOOD GAS, ED  EKG  EKG Interpretation  Date/Time:  Saturday March 24 2022 17:40:01 EST Ventricular Rate:  69 PR Interval:  164 QRS Duration: 89 QT Interval:  400 QTC Calculation: 429 R Axis:   -10 Text Interpretation: Sinus rhythm Confirmed by Addison Lank [phone]) on 03/25/2022 2:48:38 AM       Radiology DG Chest 2 View  Result Date: 03/24/2022 CLINICAL DATA:  Dyspnea, productive cough. EXAM: CHEST - 2 VIEW COMPARISON:  October 10, 2019. FINDINGS: The heart size and mediastinal contours are within normal limits. Stable nodular density seen in right midlung which most likely is benign given the lack of change since 2021. No consolidative process is noted. The visualized skeletal structures are unremarkable. IMPRESSION: Stable nodular density seen in right midlung which most likely is benign given the lack of change since 2021. No acute abnormality is noted. Electronically Signed   By: Marijo Conception M.D.   On: 03/24/2022 18:48    Medications Ordered in ED Medications  azithromycin (ZITHROMAX) tablet 500 mg (has no administration in time range)  magnesium sulfate IVPB 2 g 50 mL (has no administration in time range)  albuterol (PROVENTIL,VENTOLIN) solution continuous neb (has no administration in time range)  methylPREDNISolone sodium succinate (SOLU-MEDROL) 125 mg/2 mL injection 125 mg (has no administration in time range)  predniSONE (DELTASONE) tablet 60 mg (60 mg Oral Given 03/25/22 0307)  ipratropium-albuterol (DUONEB) 0.5-2.5 (3) MG/3ML nebulizer solution 3 mL (3 mLs Nebulization Given 03/25/22 0334)  AeroChamber Plus Flo-Vu Large MISC 1 each (1 each Other Given 03/25/22 0542)  ipratropium (ATROVENT HFA) inhaler 2 puff (2 puffs Inhalation Given 03/25/22  0541)                                                                                                                                     Procedures .Critical Care  Performed by: Fatima Blank, MD Authorized by: Fatima Blank, MD   Critical care provider statement:    Critical care time (minutes):  45   Critical care time was exclusive of:  Separately billable procedures and treating other patients   Critical care was necessary to treat or prevent imminent or life-threatening deterioration of the following conditions:  Respiratory failure   Critical care was time spent personally by me on the following activities:  Development of treatment plan with patient or surrogate, discussions with consultants, evaluation of patient's response to treatment, examination of patient, obtaining history from patient or surrogate, review of old charts, re-evaluation of patient's condition, pulse oximetry, ordering and review of radiographic studies, ordering and review of laboratory studies and ordering and performing treatments and interventions   (including critical care time)  Medical Decision Making / ED Course   Medical Decision Making Amount and/or Complexity of Data Reviewed Labs: ordered. Decision-making details documented in ED Course. Radiology: ordered and independent interpretation performed. Decision-making details documented in ED Course. ECG/medicine tests: ordered and independent interpretation  performed. Decision-making details documented in ED Course.  Risk Prescription drug management. Decision regarding hospitalization.    Patient presents with cough and shortness of breath.  Given her history, most likely COPD exacerbation, will assess for evidence of pneumonia, lithiasis, pulmonary edema or effusions.  Given the gradual onset, low suspicion for pulmonary embolism.  Will get labs to assess for any electrolyte or metabolic derangement source of the anemia will  also check viral panel.  In the meantime patient will be provided with breathing treatments and oral steroids. Given Azithro.  CBC without leukocytosis or anemia. Metabolic panel without significant electrolyte derangements or renal sufficiency. Viral panel negative for COVID, influenza, RSV. Chest x-ray without evidence of pneumonia, pneumothorax, pulmonary edema or pleural effusions.  On reassessment patient reports that her breathing feels better. Patient still has diffuse wheezing. During observation period, patient became hypoxic and required supplemental O2. Patient requires additional breathing treatments. Will provide a IV Solu-Medrol and magnesium. Will admit to medicine for further management.      Final Clinical Impression(s) / ED Diagnoses Final diagnoses:  COPD exacerbation ([place])           This chart was dictated using voice recognition software.  Despite best efforts to proofread,  errors can occur which can change the documentation meaning.    Fatima Blank, MD 03/25/22 [phone]

## 2022-03-25 NOTE — ED Notes (Signed)
ED TO INPATIENT HANDOFF REPORT  ED Nurse Name and Phone #: [place]   S Name/Age/Gender Melissa Dickerson 52 y.o. female Room/Bed: WA24/WA24  Code Status   Code Status: Full Code  Home/SNF/Other Boarding Home Patient oriented to: self, place, time, and situation Is this baseline? Yes   Triage Complete: Triage complete  Chief Complaint Acute respiratory failure with hypoxia (HCC) [J96.01]  Triage Note Pt arrived via EMS, from home, c/o SOB that started earlier after smoking. Hx of COPD   2.5 albuterol neb tx given en route   Allergies Allergies  Allergen Reactions   Haldol [Haloperidol] Rash   Pork-Derived Products Hives   Prozac [Fluoxetine Hcl] Rash    Level of Care/Admitting Diagnosis ED Disposition     ED Disposition  Admit   Condition  --   Comment  Hospital Area: [facility] [[id number]]  Level of Care: Telemetry [5]  Admit to tele based on following criteria: Other see comments  Comments: Acute respiratory failure with hypoxia  May place patient in observation at [facility] or [facility] if equivalent level of care is available:: No  Covid Evaluation: Confirmed COVID Negative  Diagnosis: Acute respiratory failure with hypoxia [facility]) [[id number]]  Admitting Physician: Bobette Mo [[id number]]  Attending Physician: Bobette Mo [[id number]]          B Medical/Surgery History Past Medical History:  Diagnosis Date   Bipolar 1 disorder (HCC)    COPD (chronic obstructive pulmonary disease) (HCC)    Depression    Hypertension    Schizoaffective disorder (HCC)    Past Surgical History:  Procedure Laterality Date   CHOLECYSTECTOMY       A IV Location/Drains/Wounds Patient Lines/Drains/Airways Status     Active Line/Drains/Airways     Name Placement date Placement time Site Days   Peripheral IV 03/25/22 20 G Anterior;Left Forearm 03/25/22  0612  Forearm  less than 1            Intake/Output Last 24 hours No intake or  output data in the 24 hours ending 03/25/22 1315  Labs/Imaging Results for orders placed or performed during the hospital encounter of 03/24/22 (from the past 48 hour(s))  Resp panel by RT-PCR (RSV, Flu A&B, Covid) Anterior Nasal Swab     Status: None   Collection Time: 03/24/22  5:57 PM   Specimen: Anterior Nasal Swab  Result Value Ref Range   SARS Coronavirus 2 by RT PCR NEGATIVE NEGATIVE    Comment: (NOTE) SARS-CoV-2 target nucleic acids are NOT DETECTED.  The SARS-CoV-2 RNA is generally detectable in upper respiratory specimens during the acute phase of infection. The lowest concentration of SARS-CoV-2 viral copies this assay can detect is 138 copies/mL. A negative result does not preclude SARS-Cov-2 infection and should not be used as the sole basis for treatment or other patient management decisions. A negative result may occur with  improper specimen collection/handling, submission of specimen other than nasopharyngeal swab, presence of viral mutation(s) within the areas targeted by this assay, and inadequate number of viral copies(<138 copies/mL). A negative result must be combined with clinical observations, patient history, and epidemiological information. The expected result is Negative.  Fact Sheet for Patients:  BloggerCourse.com  Fact Sheet for Healthcare Providers:  SeriousBroker.it  This test is no t yet approved or cleared by the [place] FDA and  has been authorized for detection and/or diagnosis of SARS-CoV-2 by FDA under an Emergency Use Authorization (EUA). This EUA will remain  in effect (  meaning this test can be used) for the duration of the COVID-19 declaration under Section 564(b)(1) of the Act, 21 U.S.C.section 360bbb-3(b)(1), unless the authorization is terminated  or revoked sooner.       Influenza A by PCR NEGATIVE NEGATIVE   Influenza B by PCR NEGATIVE NEGATIVE    Comment: (NOTE) The Xpert  Xpress SARS-CoV-2/FLU/RSV plus assay is intended as an aid in the diagnosis of influenza from Nasopharyngeal swab specimens and should not be used as a sole basis for treatment. Nasal washings and aspirates are unacceptable for Xpert Xpress SARS-CoV-2/FLU/RSV testing.  Fact Sheet for Patients: EntrepreneurPulse.com.au  Fact Sheet for Healthcare Providers: IncredibleEmployment.be  This test is not yet approved or cleared by the [place] FDA and has been authorized for detection and/or diagnosis of SARS-CoV-2 by FDA under an Emergency Use Authorization (EUA). This EUA will remain in effect (meaning this test can be used) for the duration of the COVID-19 declaration under Section 564(b)(1) of the Act, 21 U.S.C. section 360bbb-3(b)(1), unless the authorization is terminated or revoked.     Resp Syncytial Virus by PCR NEGATIVE NEGATIVE    Comment: (NOTE) Fact Sheet for Patients: EntrepreneurPulse.com.au  Fact Sheet for Healthcare Providers: IncredibleEmployment.be  This test is not yet approved or cleared by the [place] FDA and has been authorized for detection and/or diagnosis of SARS-CoV-2 by FDA under an Emergency Use Authorization (EUA). This EUA will remain in effect (meaning this test can be used) for the duration of the COVID-19 declaration under Section 564(b)(1) of the Act, 21 U.S.C. section 360bbb-3(b)(1), unless the authorization is terminated or revoked.  Performed at [facility], [place] [address] [place], [place] [address]   CBC with Differential     Status: Abnormal   Collection Time: 03/24/22  6:32 PM  Result Value Ref Range   WBC 9.5 4.0 - 10.5 K/uL   RBC 5.38 (H) 3.87 - 5.11 MIL/uL   Hemoglobin 14.7 12.0 - 15.0 g/dL   HCT 46.8 (H) 36.0 - 46.0 %   MCV 87.0 80.0 - 100.0 fL   MCH 27.3 26.0 - 34.0 pg   MCHC 31.4 30.0 - 36.0 g/dL   RDW 14.4 11.5 - 15.5 %    Platelets 238 150 - 400 K/uL   nRBC 0.0 0.0 - 0.2 %   Neutrophils Relative % 53 %   Neutro Abs 5.2 1.7 - 7.7 K/uL   Lymphocytes Relative 37 %   Lymphs Abs 3.5 0.7 - 4.0 K/uL   Monocytes Relative 5 %   Monocytes Absolute 0.5 0.1 - 1.0 K/uL   Eosinophils Relative 2 %   Eosinophils Absolute 0.2 0.0 - 0.5 K/uL   Basophils Relative 1 %   Basophils Absolute 0.1 0.0 - 0.1 K/uL   Immature Granulocytes 2 %   Abs Immature Granulocytes 0.15 (H) 0.00 - 0.07 K/uL    Comment: Performed at [facility], [place] [address], [place], [place] [address]  Basic metabolic panel     Status: Abnormal   Collection Time: 03/24/22  6:32 PM  Result Value Ref Range   Sodium 137 135 - 145 mmol/L   Potassium 4.0 3.5 - 5.1 mmol/L   Chloride 100 98 - 111 mmol/L   CO2 27 22 - 32 mmol/L   Glucose, Bld 104 (H) 70 - 99 mg/dL    Comment: Glucose reference range applies only to samples taken after fasting for at least 8 hours.   BUN 10 6 - 20 mg/dL  Creatinine, Ser 0.86 0.44 - 1.00 mg/dL   Calcium 8.9 8.9 - 10.3 mg/dL   GFR, Estimated >60 >60 mL/min    Comment: (NOTE) Calculated using the CKD-EPI Creatinine Equation (2021)    Anion gap 10 5 - 15    Comment: Performed at [facility], [place] [address] [place], [place] [address]  Blood gas, venous (at [facility] and [facility])     Status: Abnormal   Collection Time: 03/25/22  6:34 AM  Result Value Ref Range   pH, Ven 7.39 7.25 - 7.43   pCO2, Ven 53 44 - 60 mmHg   pO2, Ven 56 (H) 32 - 45 mmHg   Bicarbonate 32.5 (H) 20.0 - 28.0 mmol/L   Acid-Base Excess 5.7 (H) 0.0 - 2.0 mmol/L   O2 Saturation 92.2 %   Patient temperature 36.1     Comment: Performed at [facility], [place] [address], [place], [place] [address]   DG Chest 2 View  Result Date: 03/24/2022 CLINICAL DATA:  Dyspnea, productive cough. EXAM: CHEST - 2 VIEW COMPARISON:  October 10, 2019. FINDINGS: The heart size and mediastinal contours are within normal limits. Stable  nodular density seen in right midlung which most likely is benign given the lack of change since 2021. No consolidative process is noted. The visualized skeletal structures are unremarkable. IMPRESSION: Stable nodular density seen in right midlung which most likely is benign given the lack of change since 2021. No acute abnormality is noted. Electronically Signed   By: Marijo Conception M.D.   On: 03/24/2022 18:48    Pending Labs Unresulted Labs (From admission, onward)     Start     Ordered   03/26/22 0500  HIV Antibody (routine testing w rflx)  (HIV Antibody (Routine testing w reflex) panel)  Tomorrow morning,   R        03/25/22 0711   03/26/22 0500  CBC  Tomorrow morning,   R        03/25/22 0711   03/26/22 0500  Comprehensive metabolic panel  Tomorrow morning,   R        03/25/22 0711            Vitals/Pain Today's Vitals   03/25/22 0930 03/25/22 0948 03/25/22 1038 03/25/22 1215  BP: (!) 158/73 (!) 158/78 (!) 140/80   Pulse: (!) 104 97 99   Resp: (!) 23 20 20    Temp:   98.3 F (36.8 C)   TempSrc:   Oral   SpO2: (!) 87% 92% 92% 92%    Isolation Precautions No active isolations  Medications Medications  azithromycin (ZITHROMAX) tablet 500 mg (500 mg Oral Given 03/25/22 0903)  albuterol (PROVENTIL) (2.5 MG/3ML) 0.083% nebulizer solution (0 mg/hr Nebulization Stopped 03/25/22 0900)  acetaminophen (TYLENOL) tablet 650 mg (has no administration in time range)    Or  acetaminophen (TYLENOL) suppository 650 mg (has no administration in time range)  ondansetron (ZOFRAN) tablet 4 mg (has no administration in time range)    Or  ondansetron (ZOFRAN) injection 4 mg (has no administration in time range)  ipratropium-albuterol (DUONEB) 0.5-2.5 (3) MG/3ML nebulizer solution 3 mL (3 mLs Nebulization Given 03/25/22 1215)  albuterol (PROVENTIL) (2.5 MG/3ML) 0.083% nebulizer solution 2.5 mg (has no administration in time range)  benztropine (COGENTIN) tablet 1 mg (has no administration in  time range)  divalproex (DEPAKOTE) DR tablet 250 mg (250 mg Oral Given 03/25/22 0938)  nicotine (NICODERM CQ - dosed in mg/24 hr) patch 7 mg (  has no administration in time range)  hydrOXYzine (ATARAX) tablet 25 mg (has no administration in time range)  montelukast (SINGULAIR) tablet 10 mg (has no administration in time range)  lisinopril (ZESTRIL) tablet 10 mg (10 mg Oral Given 03/25/22 0926)  predniSONE (DELTASONE) tablet 40 mg (has no administration in time range)  predniSONE (DELTASONE) tablet 60 mg (60 mg Oral Given 03/25/22 0307)  ipratropium-albuterol (DUONEB) 0.5-2.5 (3) MG/3ML nebulizer solution 3 mL (3 mLs Nebulization Given 03/25/22 0334)  AeroChamber Plus Flo-Vu Large MISC 1 each (1 each Other Given 03/25/22 0542)  ipratropium (ATROVENT HFA) inhaler 2 puff (2 puffs Inhalation Given 03/25/22 0541)  magnesium sulfate IVPB 2 g 50 mL (0 g Intravenous Stopped 03/25/22 1150)  methylPREDNISolone sodium succinate (SOLU-MEDROL) 125 mg/2 mL injection 125 mg (125 mg Intravenous Given 03/25/22 0634)  potassium chloride SA (KLOR-CON M) CR tablet 20 mEq (20 mEq Oral Given 03/25/22 0743)    Mobility walks     Focused Assessments    R Recommendations: See Admitting Provider Note  Report given to:   Additional Notes:

## 2022-03-25 NOTE — Progress Notes (Addendum)
Melissa Dickerson states she is homeless and in need of help to find a place to live. She said she has no living relatives and her only support are her church members. Melissa Dickerson states she doesn't always have food or any money to get food or her medicines. The patient states she belongs to a group called the  [employer]. It sounds like a medical group that helps the homeless. She also has scabs covering her face.abdomen.legs,arms and back. I asked her how she got them and she states she picks at her skin because of anxiety. Melissa Dickerson is on 4 L O2 via Nasal canula.She does not use O2 outside the hospital.

## 2022-03-25 NOTE — Progress Notes (Signed)
This a 52 year old female with history of COPD, bipolar disorder, hypertension, schizoaffective disorder over the past few days.  On presentation her oxygen was in the mid 90s.  She was treated with IV steroids, p.o. azithromycin and continuous nebs.  She continues to desats and needs 2 to 3 L to maintain her sats above 88%.  Currently she has been ordered IV mag and admission requested.

## 2022-03-25 NOTE — H&P (Signed)
History and Physical    Patient: Melissa Dickerson VQQ:[id number] DOB: 03-21-1970 DOA: 03/24/2022 DOS: the patient was seen and examined on 03/25/2022 PCP: [facility] [place]  Patient coming from: Home  Chief Complaint: No chief complaint on file.  HPI: Melissa Dickerson is a 52 y.o. female with medical history significant of bipolar 1 disorder, depression, schizoaffective disorder, hypertension, COPD, tobacco use who came in to the emergency department from home due to progressively worse dyspnea associated with productive cough for yellow/greenish sputum, wheezing and fatigue which started as an upper respiratory infection with rhinorrhea and sore throat about 2 weeks ago after she got exposed to a neighbor with URI symptoms.  Since then, she no longer has rhinorrhea or sore throat, but started progressively having cough that has become more productive over the past few days.  She has only been smoking 2 cigarettes/day.  No fever, chills or night sweats. No hemoptysis.  No chest pain, palpitations, diaphoresis, PND, orthopnea or pitting edema of the lower extremities.  No appetite changes, abdominal pain, diarrhea, constipation, melena or hematochezia.  No flank pain, dysuria, frequency or hematuria.  No polyuria, polydipsia, polyphagia or blurred vision.  Lab work: Her CBC showed a white count 9.5, hemoglobin 14.7 g/dL platelets 238.  Coronavirus, influenza and RSV PCR was negative.  BMP with nonfasting glucose of 104 mg/dL, but otherwise unremarkable.  Venous blood gas showed an increased pO2 of 56 mmHg, bicarbonate 32 and acid-base axis of 5.7 mmol/L.  It was otherwise normal.  Imaging: 2 view chest radiograph with stable nodular density seen in the right midlung which is most likely benign given the lack of change since 2021.  There was no acute abnormality noted.  ED course: Initial vital signs were temperature 98.1 F, pulse 71, respiration 20, BP 182/104 mmHg O2 sat 94% on room air.  The patient  received a DuoNeb, Atrovent 2 puffs x 1 prednisone 60 mg p.o. x 1, Solu-Medrol 125 mg IVP x 1, magnesium sulfate 2 g IVPB and I added KCl 40 mEq p.o. x 1.   Review of Systems: As mentioned in the history of present illness. All other systems reviewed and are negative. Past Medical History:  Diagnosis Date   Bipolar 1 disorder ([place])    COPD (chronic obstructive pulmonary disease) (HCC)    Depression    Hypertension    Schizoaffective disorder ([place])    Past Surgical History:  Procedure Laterality Date   CHOLECYSTECTOMY     Social History:  reports that she has been smoking cigarettes. She has a 16.50 pack-year smoking history. She has never used smokeless tobacco. She reports that she does not currently use alcohol. She reports that she does not currently use drugs after having used the following drugs: "Crack" cocaine.  Allergies  Allergen Reactions   Haldol [Haloperidol] Rash   Pork-Derived Products Hives   Prozac [Fluoxetine Hcl] Rash    Family History  Problem Relation Age of Onset   Cancer Mother     Prior to Admission medications   Medication Sig Start Date End Date Taking? Authorizing Provider  benztropine (COGENTIN) 1 MG tablet Take 1 tablet (1 mg total) by mouth at bedtime. 08/30/21  Yes Elsie Stain, MD  divalproex (DEPAKOTE) 250 MG DR tablet Take 1 tablet (250 mg total) by mouth 2 (two) times daily. 08/30/21  Yes Elsie Stain, MD  albuterol (VENTOLIN HFA) 108 (90 Base) MCG/ACT inhaler Inhale 1 puff into the lungs 3 (three) times daily as needed for shortness  of breath or wheezing. Patient not taking: Reported on 03/25/2022 08/30/21   Elsie Stain, MD  ARIPiprazole (ABILIFY) 15 MG tablet Take 1 tablet (15 mg total) by mouth daily. Patient not taking: Reported on 03/25/2022 08/30/21   Elsie Stain, MD  fluticasone-salmeterol (ADVAIR) 250-50 MCG/ACT AEPB Inhale 1 puff into the lungs 2 (two) times daily. Patient not taking: Reported on 03/25/2022 08/30/21   Elsie Stain, MD  gabapentin (NEURONTIN) 300 MG capsule Take 1 capsule (300 mg total) by mouth at bedtime. Patient not taking: Reported on 03/25/2022 10/10/19   Margarita Mail, PA-C  ipratropium-albuterol (DUONEB) 0.5-2.5 (3) MG/3ML SOLN Take 3 mLs by nebulization every 6 (six) hours as needed. Patient not taking: Reported on 03/25/2022 10/10/19   Margarita Mail, PA-C  montelukast (SINGULAIR) 10 MG tablet Take 1 tablet (10 mg total) by mouth daily. Patient not taking: Reported on 03/25/2022 08/30/21   Elsie Stain, MD  Multiple Vitamin (MULTIVITAMIN WITH MINERALS) TABS tablet Take 1 tablet by mouth daily. Patient not taking: Reported on 03/25/2022 05/31/18   Johnn Hai, MD  predniSONE (DELTASONE) 10 MG tablet Take 4 tablets daily for 5 days then stop Patient not taking: Reported on 03/25/2022 08/30/21   Elsie Stain, MD  valsartan-hydrochlorothiazide (DIOVAN-HCT) 160-25 MG tablet Take 1 tablet by mouth daily. Patient not taking: Reported on 03/25/2022 08/30/21   Elsie Stain, MD  Vitamin D, Ergocalciferol, (DRISDOL) 1.25 MG (50000 UNIT) CAPS capsule Take 1 capsule (50,000 Units total) by mouth once a week. Patient not taking: Reported on 03/25/2022 10/10/19   Margarita Mail, PA-C    Physical Exam: Vitals:   03/25/22 0651 03/25/22 0730 03/25/22 0800 03/25/22 0830  BP:  (!) 159/70 (!) 145/130 (!) 152/70  Pulse:  91 89 93  Resp:  17 17 (!) 21  Temp:      TempSrc:      SpO2: 94% 92% 93% 95%   Physical Exam Vitals and nursing note reviewed.  Constitutional:      General: She is awake.     Interventions: Nasal cannula in place.  HENT:     Head: Normocephalic.     Nose: No rhinorrhea.     Mouth/Throat:     Mouth: Mucous membranes are moist.  Eyes:     General: Scleral icterus present.     Pupils: Pupils are equal, round, and reactive to light.  Neck:     Vascular: No JVD.  Cardiovascular:     Rate and Rhythm: Normal rate and regular rhythm.     Heart sounds: S1 normal and S2  normal.  Pulmonary:     Effort: Tachypnea present. No accessory muscle usage.     Breath sounds: Wheezing and rhonchi present. No rales.  Abdominal:     General: Bowel sounds are normal.     Palpations: Abdomen is soft.  Musculoskeletal:     Cervical back: Neck supple.     Right lower leg: No edema.     Left lower leg: No edema.  Skin:    General: Skin is warm and dry.  Neurological:     General: No focal deficit present.     Mental Status: She is alert and oriented to person, place, and time.  Psychiatric:        Mood and Affect: Mood normal.        Behavior: Behavior normal. Behavior is cooperative.     Data Reviewed:  Results are pending, will review when available.  Assessment and  Plan: Principal Problem:   Acute respiratory failure with hypoxia (HCC) In the setting of:   COPD exacerbation (HCC)   Tobacco abuse Observation/telemetry Continue supplemental oxygen. Received methylprednisolone 125 mg IVP x1. Received prednisone 60 mg p.o. x 1. Prednisone 40 mg p.o. daily starting tomorrow. Scheduled and as needed bronchodilators. Follow-up CBC and chemistry in the morning.  Tobacco cessation advised.   Active Problems:   Schizoaffective disorder, depressive type (HCC)   Major depressive disorder, recurrent severe without psychotic features (HCC) No SI or HI. Continue Depakote 250 mg p.o. twice daily. Continue benztropine 1 mg p.o. bedtime. Continue hydroxyzine 25 mg p.o. every 8 hours as needed for anxiety. Follow-up with PCP and/or behavioral health as an outpatient.    HTN (hypertension) Continue lisinopril 10 mg p.o. daily.    GERD (gastroesophageal reflux disease) No significant symptoms at this time. Famotidine 20 mg p.o. twice daily while in the hospital.       Advance Care Planning:   Code Status: Full Code   Consults:   Family Communication:   Severity of Illness: The appropriate patient status for this patient is INPATIENT. Inpatient  status is judged to be reasonable and necessary in order to provide the required intensity of service to ensure the patient's safety. The patient's presenting symptoms, physical exam findings, and initial radiographic and laboratory data in the context of their chronic comorbidities is felt to place them at high risk for further clinical deterioration. Furthermore, it is not anticipated that the patient will be medically stable for discharge from the hospital within 2 midnights of admission.   * I certify that at the point of admission it is my clinical judgment that the patient will require inpatient hospital care spanning beyond 2 midnights from the point of admission due to high intensity of service, high risk for further deterioration and high frequency of surveillance required.*  Author: Bobette Mo, MD 03/25/2022 8:45 AM  For on call review [email or web address].   This document was prepared using Dragon voice recognition software and may contain some unintended transcription errors.

## 2022-03-26 ENCOUNTER — Other Ambulatory Visit (HOSPITAL_COMMUNITY): Payer: Self-pay

## 2022-03-26 DIAGNOSIS — J9601 Acute respiratory failure with hypoxia: Secondary | ICD-10-CM | POA: Diagnosis not present

## 2022-03-26 LAB — CBC
HCT: 43.5 % (ref 36.0–46.0)
Hemoglobin: 13.8 g/dL (ref 12.0–15.0)
MCH: 27.7 pg (ref 26.0–34.0)
MCHC: 31.7 g/dL (ref 30.0–36.0)
MCV: 87.3 fL (ref 80.0–100.0)
Platelets: 244 10*3/uL (ref 150–400)
RBC: 4.98 MIL/uL (ref 3.87–5.11)
RDW: 14.6 % (ref 11.5–15.5)
WBC: 16.3 10*3/uL — ABNORMAL HIGH (ref 4.0–10.5)
nRBC: 0 % (ref 0.0–0.2)

## 2022-03-26 LAB — COMPREHENSIVE METABOLIC PANEL
ALT: 14 U/L (ref 0–44)
AST: 17 U/L (ref 15–41)
Albumin: 3.1 g/dL — ABNORMAL LOW (ref 3.5–5.0)
Alkaline Phosphatase: 53 U/L (ref 38–126)
Anion gap: 8 (ref 5–15)
BUN: 15 mg/dL (ref 6–20)
CO2: 27 mmol/L (ref 22–32)
Calcium: 9 mg/dL (ref 8.9–10.3)
Chloride: 104 mmol/L (ref 98–111)
Creatinine, Ser: 0.8 mg/dL (ref 0.44–1.00)
GFR, Estimated: >60 mL/min (ref >60)
Glucose, Bld: 99 mg/dL (ref 70–99)
Potassium: 4.7 mmol/L (ref 3.5–5.1)
Sodium: 139 mmol/L (ref 135–145)
Total Bilirubin: 0.5 mg/dL (ref 0.3–1.2)
Total Protein: 6.6 g/dL (ref 6.5–8.1)

## 2022-03-26 LAB — HIV ANTIBODY (ROUTINE TESTING W REFLEX): HIV Screen 4th Generation wRfx: NONREACTIVE

## 2022-03-26 MED ORDER — IPRATROPIUM-ALBUTEROL 0.5-2.5 (3) MG/3ML IN SOLN: 3.0000 mL | Freq: Four times a day (QID) | RESPIRATORY_TRACT | Status: DC | PRN

## 2022-03-26 MED ORDER — BENZTROPINE MESYLATE 1 MG PO TABS
1.0000 mg | ORAL_TABLET | Freq: Every day | ORAL | 0 refills | Status: AC
  Filled 2022-03-26: qty 30, 30d supply, fill #0

## 2022-03-26 MED ORDER — PREDNISONE 20 MG PO TABS
40.0000 mg | ORAL_TABLET | Freq: Every day | ORAL | 0 refills | Status: AC
  Filled 2022-03-26: qty 8, 4d supply, fill #0

## 2022-03-26 MED ORDER — HYDROXYZINE HCL 25 MG PO TABS
25.0000 mg | ORAL_TABLET | Freq: Three times a day (TID) | ORAL | 0 refills | Status: AC | PRN
  Filled 2022-03-26: qty 30, 10d supply, fill #0

## 2022-03-26 MED ORDER — LOSARTAN POTASSIUM 25 MG PO TABS
25.0000 mg | ORAL_TABLET | Freq: Every day | ORAL | 3 refills | Status: AC
  Filled 2022-03-26: qty 30, 30d supply, fill #0

## 2022-03-26 MED ORDER — LOSARTAN POTASSIUM 50 MG PO TABS
25.0000 mg | ORAL_TABLET | Freq: Every day | ORAL | Status: DC
  Administered 2022-03-26: 25 mg via ORAL
  Filled 2022-03-26: qty 1

## 2022-03-26 MED ORDER — DIVALPROEX SODIUM 250 MG PO DR TAB
250.0000 mg | DELAYED_RELEASE_TABLET | Freq: Two times a day (BID) | ORAL | 0 refills | Status: AC
  Filled 2022-03-26: qty 60, 30d supply, fill #0

## 2022-03-26 MED ORDER — MONTELUKAST SODIUM 10 MG PO TABS
10.0000 mg | ORAL_TABLET | Freq: Every day | ORAL | 0 refills | Status: AC
  Filled 2022-03-26: qty 30, 30d supply, fill #0

## 2022-03-26 MED ORDER — AZITHROMYCIN 250 MG PO TABS
250.0000 mg | ORAL_TABLET | Freq: Every day | ORAL | 0 refills | Status: AC
  Filled 2022-03-26: qty 4, 4d supply, fill #0

## 2022-03-26 MED ORDER — FLUTICASONE-SALMETEROL 250-50 MCG/ACT IN AEPB
1.0000 | INHALATION_SPRAY | Freq: Two times a day (BID) | RESPIRATORY_TRACT | 6 refills | Status: AC
  Filled 2022-03-26: qty 60, 30d supply, fill #0

## 2022-03-26 NOTE — Hospital Course (Signed)
Melissa Dickerson is a 52 y.o. F with Bipolar/schizoaffective disorder, COPD not on home O2, still smoking, and HTN who presented with COPD exacerbation.     1/28: COVID negative, CXR without pneumonia, wheezing and short of breaht, normal on room air

## 2022-03-26 NOTE — TOC Progression Note (Addendum)
Transition of Care [facility]) - Progression Note    Patient Details  Name: Melissa Dickerson MRN: [id number] Date of Birth: 16-Nov-1970  Transition of Care [facility]) CM/SW [place], RN Phone Number:[phone]  03/26/2022, 10:11 AM  Clinical Narrative:    CM received message to call Dr. Loleta Books. CM called MD, MD inquiring if patient will qualify for the [place] progrem. CM has made MD aware that patients with insurance coverage (patient has medicaid) do not qualify for [place]. MD states that he will have scripts filled here at the [facility]. MD also requesting that CM make patient aware of the cost of her medications. CM has explained that CM is not aware of the cost and that pharmacy will discuss this with the patient prior to deliver of meds.  MD states that patients social worker at [facility] would like to speak with CM . CM will reach out to Education officer, museum.   1016 CM attempted to reach [facility] with the ACT team at [phone] per message from Dr. Loleta Books. There is no answer voicemail has been left.   1100 CM at bedside to offer resources for local shelter. Patient states that she does not want resources because she refuses to go to a shelter. Patient states that she was living at a rooming house that she pays $700/month  and she will not go back due to too many people and a lot of drugs. Patient states that she gave her MD the phone number for her ACT team and that he is suppose to call to assist with getting her set up with housing. Patient states that her ACT team can assist her with housing  and that she understands that it is a process and she will have to wait because it takes time. CM has provided patient with bus passes for discharge. TOC has no other resources at this time and will sign off  1313 CM received return call from [facility] who states that she is the RN on the ACT team covering the patient. CM informed Melissa that CM is calling per MD request due to MD stating  that ACT team would like to speak with CM about discharge planning. Per Lenna Sciara she is a Therapist, sports on the ACT team but the patient is new to the team and they are not very familiar with her because they have only been following her for about 3 days. Melissa states that patient was at a boarding house and left because patient reported that she felt unsafe at the boarding house. Melissa also reports that patient has knowledge about shelters but has been unwilling to go. Per Melissa the ACT team can not do anything for this patient immediately and that there are no funds for housing but they are able to assist with paperwork.         Expected Discharge Plan and Services         Expected Discharge Date: 03/26/22                                     Social Determinants of Health (SDOH) Interventions SDOH Screenings   Food Insecurity: Food Insecurity Present (03/25/2022)  Housing: High Risk (03/25/2022)  Transportation Needs: Unmet Transportation Needs (03/25/2022)  Utilities: Not At Risk (03/25/2022)  Alcohol Screen: Medium Risk (05/26/2018)  Tobacco Use: High Risk (03/24/2022)    Readmission Risk Interventions     No data  to display

## 2022-03-26 NOTE — Discharge Summary (Signed)
Physician Discharge Summary   Patient: Melissa Dickerson MRN: [id number] DOB: [birth year]  Admit date:     03/24/2022  Discharge date: 03/26/22  Discharge Physician: Edwin Dada   PCP: [place]     Recommendations at discharge:  Follow up with PCP in 1 week for COPD flare     Discharge Diagnoses: Principal Problem:   COPD Exacerbation Active Problems:   Schizoaffective disorder, depressive type ([place])   Respiratory failure ruled out   HTN (hypertension)   Tobacco abuse   GERD (gastroesophageal reflux disease)   Major depressive disorder, recurrent severe without psychotic features [facility])     Hospital Course: Melissa Dickerson is a 52 y.o. F with Bipolar/schizoaffective disorder, COPD not on home O2, still smoking, and HTN who presented with COPD exacerbation.   COVID negative, CXR without pneumonia, wheezing and short of breath, SpO2 normal on room air   Admitted on steroids, scheduled bronchodilators.  Improved in AM, ambulated with mobility tech and SpO2 on room air remained >88% with mild symptoms.   Discharged to complete 5 days prednisone, azithromycin.  Refills of inhalers given.  Smoking cessation recommended.             The [facility] Controlled Substances Registry was reviewed for this patient prior to discharge.  Consultants: None Procedures performed: None  Disposition: Home Diet recommendation:  Discharge Diet Orders (From admission, onward)     Start     Ordered   03/26/22 0000  Diet - low sodium heart healthy        03/26/22 1144             DISCHARGE MEDICATION: Allergies as of 03/26/2022       Reactions   Haldol [haloperidol] Rash   Pork-derived Products Hives   Prozac [fluoxetine Hcl] Rash        Medication List     STOP taking these medications    ARIPiprazole 15 MG tablet Commonly known as: ABILIFY   gabapentin 300 MG capsule Commonly known as: NEURONTIN   lisinopril 10 MG tablet Commonly  known as: ZESTRIL   valsartan-hydrochlorothiazide 160-25 MG tablet Commonly known as: DIOVAN-HCT       TAKE these medications    albuterol 108 (90 Base) MCG/ACT inhaler Commonly known as: VENTOLIN HFA Inhale 1 puff into the lungs 3 (three) times daily as needed for shortness of breath or wheezing.   azithromycin 250 MG tablet Commonly known as: ZITHROMAX Take 1 tablet (250 mg total) by mouth daily.   benztropine 1 MG tablet Commonly known as: COGENTIN Take 1 tablet (1 mg total) by mouth at bedtime.   divalproex 250 MG DR tablet Commonly known as: DEPAKOTE Take 1 tablet (250 mg total) by mouth 2 (two) times daily.   fluticasone-salmeterol 250-50 MCG/ACT Aepb Commonly known as: ADVAIR Inhale 1 puff into the lungs 2 (two) times daily.   hydrOXYzine 25 MG tablet Commonly known as: ATARAX Take 1 tablet (25 mg total) by mouth every 8 (eight) hours as needed for anxiety or nausea.   ipratropium-albuterol 0.5-2.5 (3) MG/3ML Soln Commonly known as: DUONEB Take 3 mLs by nebulization every 6 (six) hours as needed.   losartan 25 MG tablet Commonly known as: COZAAR Take 1 tablet (25 mg total) by mouth daily.   montelukast 10 MG tablet Commonly known as: SINGULAIR Take 1 tablet (10 mg total) by mouth daily.   multivitamin with minerals Tabs tablet Take 1 tablet by mouth daily.   predniSONE 20 MG tablet  Commonly known as: DELTASONE Take 2 tablets (40 mg total) by mouth daily with breakfast for 4 days. Start taking on: March 27, 2022 What changed:  medication strength how much to take how to take this when to take this additional instructions   Vitamin D (Ergocalciferol) 1.25 MG (50000 UNIT) Caps capsule Commonly known as: DRISDOL Take 1 capsule (50,000 Units total) by mouth once a week.        Follow-up Information     [facility] Schedule an appointment as soon as possible for a visit in 1 week(s).   Contact information: [address]  [place] [address] [phone]                 Discharge Instructions     Diet - low sodium heart healthy   Complete by: As directed    Discharge instructions   Complete by: As directed    **IMPORTANT DISCHARGE INSTRUCTIONS**   From Dr. Maryfrances Bunnell: You were admitted for a COPD flare   For the next week, take prednisone, albuterol and azithromycin.  Specifically: Take prednisone 40 mg (two tabs) once daily in the morning for 4 more days (This is the steroid)   Take azithromycin 250 mg (1 tab) once daily in the morning for 4 more days (This is the antibiotic)   Take albuterol (either in a nebulizer or a pump) plus ipratropium three times daily for the next week, then after a week, back off to as needed use only   Go see your primary doctor asap   Switch your blood pressure medicine from lisinopril to Losartan (lisinopril is associated with cough)   Increase activity slowly   Complete by: As directed        Discharge Exam: There were no vitals filed for this visit.  General: Pt is alert, awake, not in acute distress Cardiovascular: RRR, nl S1-S2, no murmurs appreciated.   No LE edema.   Respiratory: Normal respiratory rate and rhythm.  Mild wheezing bilaterally. Abdominal: Abdomen soft and non-tender.  No distension or HSM.   Neuro/Psych: Strength symmetric in upper and lower extremities.  Judgment and insight appear normal.   Condition at discharge: fair  The results of significant diagnostics from this hospitalization (including imaging, microbiology, ancillary and laboratory) are listed below for reference.   Imaging Studies: DG Chest 2 View  Result Date: 03/24/2022 CLINICAL DATA:  Dyspnea, productive cough. EXAM: CHEST - 2 VIEW COMPARISON:  October 10, 2019. FINDINGS: The heart size and mediastinal contours are within normal limits. Stable nodular density seen in right midlung which most likely is benign given the lack of change since 2021. No  consolidative process is noted. The visualized skeletal structures are unremarkable. IMPRESSION: Stable nodular density seen in right midlung which most likely is benign given the lack of change since 2021. No acute abnormality is noted. Electronically Signed   By: Lupita Raider M.D.   On: 03/24/2022 18:48    Microbiology: Results for orders placed or performed during the hospital encounter of 03/24/22  Resp panel by RT-PCR (RSV, Flu A&B, Covid) Anterior Nasal Swab     Status: None   Collection Time: 03/24/22  5:57 PM   Specimen: Anterior Nasal Swab  Result Value Ref Range Status   SARS Coronavirus 2 by RT PCR NEGATIVE NEGATIVE Final    Comment: (NOTE) SARS-CoV-2 target nucleic acids are NOT DETECTED.  The SARS-CoV-2 RNA is generally detectable in upper respiratory specimens during the acute phase of infection.  The lowest concentration of SARS-CoV-2 viral copies this assay can detect is 138 copies/mL. A negative result does not preclude SARS-Cov-2 infection and should not be used as the sole basis for treatment or other patient management decisions. A negative result may occur with  improper specimen collection/handling, submission of specimen other than nasopharyngeal swab, presence of viral mutation(s) within the areas targeted by this assay, and inadequate number of viral copies(<138 copies/mL). A negative result must be combined with clinical observations, patient history, and epidemiological information. The expected result is Negative.  Fact Sheet for Patients:  BloggerCourse.com  Fact Sheet for Healthcare Providers:  SeriousBroker.it  This test is no t yet approved or cleared by the [place] FDA and  has been authorized for detection and/or diagnosis of SARS-CoV-2 by FDA under an Emergency Use Authorization (EUA). This EUA will remain  in effect (meaning this test can be used) for the duration of the COVID-19  declaration under Section 564(b)(1) of the Act, 21 U.S.C.section 360bbb-3(b)(1), unless the authorization is terminated  or revoked sooner.       Influenza A by PCR NEGATIVE NEGATIVE Final   Influenza B by PCR NEGATIVE NEGATIVE Final    Comment: (NOTE) The Xpert Xpress SARS-CoV-2/FLU/RSV plus assay is intended as an aid in the diagnosis of influenza from Nasopharyngeal swab specimens and should not be used as a sole basis for treatment. Nasal washings and aspirates are unacceptable for Xpert Xpress SARS-CoV-2/FLU/RSV testing.  Fact Sheet for Patients: BloggerCourse.com  Fact Sheet for Healthcare Providers: SeriousBroker.it  This test is not yet approved or cleared by the [place] FDA and has been authorized for detection and/or diagnosis of SARS-CoV-2 by FDA under an Emergency Use Authorization (EUA). This EUA will remain in effect (meaning this test can be used) for the duration of the COVID-19 declaration under Section 564(b)(1) of the Act, 21 U.S.C. section 360bbb-3(b)(1), unless the authorization is terminated or revoked.     Resp Syncytial Virus by PCR NEGATIVE NEGATIVE Final    Comment: (NOTE) Fact Sheet for Patients: BloggerCourse.com  Fact Sheet for Healthcare Providers: SeriousBroker.it  This test is not yet approved or cleared by the [place] FDA and has been authorized for detection and/or diagnosis of SARS-CoV-2 by FDA under an Emergency Use Authorization (EUA). This EUA will remain in effect (meaning this test can be used) for the duration of the COVID-19 declaration under Section 564(b)(1) of the Act, 21 U.S.C. section 360bbb-3(b)(1), unless the authorization is terminated or revoked.  Performed at [facility], [address], [place], [place] [address]     Labs: CBC: Recent Labs  Lab 03/24/22 1832 03/26/22 0639  WBC 9.5  16.3*  NEUTROABS 5.2  --   HGB 14.7 13.8  HCT 46.8* 43.5  MCV 87.0 87.3  PLT 238 244   Basic Metabolic Panel: Recent Labs  Lab 03/24/22 1832 03/26/22 0639  NA 137 139  K 4.0 4.7  CL 100 104  CO2 27 27  GLUCOSE 104* 99  BUN 10 15  CREATININE 0.86 0.80  CALCIUM 8.9 9.0   Liver Function Tests: Recent Labs  Lab 03/26/22 0639  AST 17  ALT 14  ALKPHOS 53  BILITOT 0.5  PROT 6.6  ALBUMIN 3.1*   CBG: No results for input(s): "GLUCAP" in the last 168 hours.  Discharge time spent: approximately 35 minutes spent on discharge counseling, evaluation of patient on day of discharge, and coordination of discharge planning with nursing, social work, pharmacy and case management  Signed: Edwin Dada, MD [facility] 03/26/2022
